# Patient Record
Sex: Female | Born: 1950 | Race: Black or African American | Hispanic: No | Smoking: Former smoker
Health system: Southern US, Community
[De-identification: ages and names within clinical notes are randomized; demographics above are authoritative.]

## PROBLEM LIST (undated history)

## (undated) DIAGNOSIS — K635 Polyp of colon: Secondary | ICD-10-CM

## (undated) DIAGNOSIS — Z8619 Personal history of other infectious and parasitic diseases: Secondary | ICD-10-CM

## (undated) DIAGNOSIS — I10 Essential (primary) hypertension: Secondary | ICD-10-CM

## (undated) DIAGNOSIS — G5710 Meralgia paresthetica, unspecified lower limb: Secondary | ICD-10-CM

## (undated) DIAGNOSIS — H409 Unspecified glaucoma: Secondary | ICD-10-CM

## (undated) HISTORY — PX: TONSILLECTOMY AND ADENOIDECTOMY: SHX28

## (undated) HISTORY — DX: Personal history of other infectious and parasitic diseases: Z86.19

## (undated) HISTORY — PX: BREAST CYST EXCISION: SHX579

## (undated) HISTORY — DX: Unspecified glaucoma: H40.9

## (undated) HISTORY — DX: Polyp of colon: K63.5

## (undated) HISTORY — DX: Meralgia paresthetica, unspecified lower limb: G57.10

## (undated) HISTORY — DX: Essential (primary) hypertension: I10

---

## 1999-12-10 ENCOUNTER — Encounter: Admission: RE | Admit: 1999-12-10 | Discharge: 2000-03-09 | Payer: Self-pay | Admitting: Internal Medicine

## 2000-09-23 ENCOUNTER — Other Ambulatory Visit: Admission: RE | Admit: 2000-09-23 | Discharge: 2000-09-23 | Payer: Self-pay | Admitting: Obstetrics and Gynecology

## 2000-09-23 ENCOUNTER — Encounter (INDEPENDENT_AMBULATORY_CARE_PROVIDER_SITE_OTHER): Payer: Self-pay

## 2000-12-07 ENCOUNTER — Encounter: Payer: Self-pay | Admitting: Emergency Medicine

## 2000-12-07 ENCOUNTER — Emergency Department (HOSPITAL_COMMUNITY): Admission: EM | Admit: 2000-12-07 | Discharge: 2000-12-08 | Payer: Self-pay | Admitting: Emergency Medicine

## 2002-03-07 ENCOUNTER — Emergency Department (HOSPITAL_COMMUNITY): Admission: EM | Admit: 2002-03-07 | Discharge: 2002-03-08 | Payer: Self-pay | Admitting: *Deleted

## 2002-03-07 ENCOUNTER — Encounter: Payer: Self-pay | Admitting: Emergency Medicine

## 2002-03-16 ENCOUNTER — Encounter: Payer: Self-pay | Admitting: Internal Medicine

## 2002-03-16 ENCOUNTER — Encounter: Admission: RE | Admit: 2002-03-16 | Discharge: 2002-03-16 | Payer: Self-pay | Admitting: Internal Medicine

## 2004-03-11 ENCOUNTER — Ambulatory Visit: Payer: Self-pay | Admitting: Family Medicine

## 2004-06-18 ENCOUNTER — Ambulatory Visit: Payer: Self-pay | Admitting: Family Medicine

## 2004-08-19 ENCOUNTER — Ambulatory Visit: Payer: Self-pay | Admitting: Family Medicine

## 2005-01-13 ENCOUNTER — Ambulatory Visit: Payer: Self-pay | Admitting: *Deleted

## 2005-02-25 ENCOUNTER — Ambulatory Visit: Payer: Self-pay | Admitting: Family Medicine

## 2005-05-27 ENCOUNTER — Ambulatory Visit: Payer: Self-pay | Admitting: Family Medicine

## 2005-09-16 ENCOUNTER — Ambulatory Visit: Payer: Self-pay | Admitting: Family Medicine

## 2005-09-25 ENCOUNTER — Ambulatory Visit: Payer: Self-pay | Admitting: Family Medicine

## 2008-05-14 ENCOUNTER — Emergency Department (HOSPITAL_COMMUNITY): Admission: EM | Admit: 2008-05-14 | Discharge: 2008-05-14 | Payer: Self-pay | Admitting: Emergency Medicine

## 2008-06-16 HISTORY — PX: CHOLECYSTECTOMY: SHX55

## 2010-07-07 ENCOUNTER — Encounter: Payer: Self-pay | Admitting: Emergency Medicine

## 2011-03-18 LAB — URINALYSIS, ROUTINE W REFLEX MICROSCOPIC
Bilirubin Urine: NEGATIVE
Glucose, UA: NEGATIVE mg/dL
Hgb urine dipstick: NEGATIVE
Ketones, ur: NEGATIVE mg/dL
Nitrite: NEGATIVE
Protein, ur: NEGATIVE mg/dL
Specific Gravity, Urine: 1.005 (ref 1.005–1.030)
Urobilinogen, UA: 0.2 mg/dL (ref 0.0–1.0)
pH: 5.5 (ref 5.0–8.0)

## 2011-03-18 LAB — URINE CULTURE: Colony Count: 40000

## 2011-03-18 LAB — URINE MICROSCOPIC-ADD ON

## 2011-03-26 ENCOUNTER — Other Ambulatory Visit: Payer: Self-pay | Admitting: Family Medicine

## 2011-03-26 DIAGNOSIS — Z1231 Encounter for screening mammogram for malignant neoplasm of breast: Secondary | ICD-10-CM

## 2011-04-28 ENCOUNTER — Ambulatory Visit: Payer: Self-pay

## 2011-04-30 ENCOUNTER — Ambulatory Visit
Admission: RE | Admit: 2011-04-30 | Discharge: 2011-04-30 | Disposition: A | Discharge status: Home / Self Care | Payer: No Typology Code available for payment source | Source: Ambulatory Visit | Attending: Family Medicine | Admitting: Family Medicine

## 2011-04-30 DIAGNOSIS — Z1231 Encounter for screening mammogram for malignant neoplasm of breast: Secondary | ICD-10-CM

## 2011-06-30 ENCOUNTER — Ambulatory Visit: Payer: Self-pay | Admitting: Family Medicine

## 2011-07-14 ENCOUNTER — Ambulatory Visit: Payer: Self-pay | Admitting: Family Medicine

## 2011-10-04 ENCOUNTER — Other Ambulatory Visit: Payer: Self-pay | Admitting: Family Medicine

## 2011-10-05 ENCOUNTER — Other Ambulatory Visit: Payer: Self-pay | Admitting: Family Medicine

## 2011-10-12 ENCOUNTER — Other Ambulatory Visit: Payer: Self-pay | Admitting: Family Medicine

## 2012-01-08 ENCOUNTER — Ambulatory Visit (INDEPENDENT_AMBULATORY_CARE_PROVIDER_SITE_OTHER): Payer: No Typology Code available for payment source | Admitting: Family Medicine

## 2012-01-08 ENCOUNTER — Encounter: Payer: Self-pay | Admitting: Family Medicine

## 2012-01-08 VITALS — BP 133/80 | HR 80 | Temp 97.4°F | Resp 18 | Ht 62.0 in | Wt 198.0 lb

## 2012-01-08 DIAGNOSIS — I10 Essential (primary) hypertension: Secondary | ICD-10-CM

## 2012-01-08 DIAGNOSIS — E669 Obesity, unspecified: Secondary | ICD-10-CM

## 2012-01-08 DIAGNOSIS — Z760 Encounter for issue of repeat prescription: Secondary | ICD-10-CM

## 2012-01-08 DIAGNOSIS — G5710 Meralgia paresthetica, unspecified lower limb: Secondary | ICD-10-CM | POA: Insufficient documentation

## 2012-01-08 DIAGNOSIS — IMO0001 Reserved for inherently not codable concepts without codable children: Secondary | ICD-10-CM

## 2012-01-08 LAB — POCT GLYCOSYLATED HEMOGLOBIN (HGB A1C): Hemoglobin A1C: 8.4

## 2012-01-08 LAB — BASIC METABOLIC PANEL
BUN: 11 mg/dL (ref 6–23)
CO2: 26 mEq/L (ref 19–32)
Calcium: 10.1 mg/dL (ref 8.4–10.5)
Chloride: 100 mEq/L (ref 96–112)
Creat: 0.65 mg/dL (ref 0.50–1.10)
Glucose, Bld: 128 mg/dL — ABNORMAL HIGH (ref 70–99)
Potassium: 3.9 mEq/L (ref 3.5–5.3)
Sodium: 136 mEq/L (ref 135–145)

## 2012-01-08 MED ORDER — METFORMIN HCL 1000 MG PO TABS: 1000.0000 mg | ORAL_TABLET | Freq: Two times a day (BID) | ORAL | Status: DC

## 2012-01-08 MED ORDER — LOSARTAN POTASSIUM-HCTZ 100-25 MG PO TABS: 1.0000 | ORAL_TABLET | Freq: Every day | ORAL | Status: DC

## 2012-01-08 MED ORDER — GLUCOSE BLOOD VI STRP: ORAL_STRIP | Status: DC

## 2012-01-08 MED ORDER — INSULIN GLARGINE 100 UNIT/ML ~~LOC~~ SOLN: 60.0000 [IU] | Freq: Every day | SUBCUTANEOUS | Status: DC

## 2012-01-08 NOTE — Patient Instructions (Addendum)
Diabetes Meal Planning Guide The diabetes meal planning guide is a tool to help you plan your meals and snacks. It is important for people with diabetes to manage their blood glucose (sugar) levels. Choosing the right foods and the right amounts throughout your day will help control your blood glucose. Eating right can even help you improve your blood pressure and reach or maintain a healthy weight. CARBOHYDRATE COUNTING MADE EASY When you eat carbohydrates, they turn to sugar. This raises your blood glucose level. Counting carbohydrates can help you control this level so you feel better. When you plan your meals by counting carbohydrates, you can have more flexibility in what you eat and balance your medicine with your food intake. Carbohydrate counting simply means adding up the total amount of carbohydrate grams in your meals and snacks. Try to eat about the same amount at each meal. Foods with carbohydrates are listed below. Each portion below is 1 carbohydrate serving or 15 grams of carbohydrates. Ask your dietician how many grams of carbohydrates you should eat at each meal or snack. Grains and Starches  1 slice bread.    English muffin or hotdog/hamburger bun.    cup cold cereal (unsweetened).   ? cup cooked pasta or rice.    cup starchy vegetables (corn, potatoes, peas, beans, winter squash).   1 tortilla (6 inches).    bagel.   1 waffle or pancake (size of a CD).    cup cooked cereal.   4 to 6 small crackers.  *Whole grain is recommended. Fruit  1 cup fresh unsweetened berries, melon, papaya, pineapple.   1 small fresh fruit.    banana or mango.    cup fruit juice (4 oz unsweetened).    cup canned fruit in natural juice or water.   2 tbs dried fruit.   12 to 15 grapes or cherries.  Milk and Yogurt  1 cup fat-free or 1% milk.   1 cup soy milk.   6 oz light yogurt with sugar-free sweetener.   6 oz low-fat soy yogurt.   6 oz plain yogurt.   Vegetables  1 cup raw or  cup cooked is counted as 0 carbohydrates or a "free" food.   If you eat 3 or more servings at 1 meal, count them as 1 carbohydrate serving.  Other Carbohydrates   oz chips or pretzels.    cup ice cream or frozen yogurt.    cup sherbet or sorbet.   2 inch square cake, no frosting.   1 tbs honey, sugar, jam, jelly, or syrup.   2 small cookies.   3 squares of graham crackers.   3 cups popcorn.   6 crackers.   1 cup broth-based soup.   Count 1 cup casserole or other mixed foods as 2 carbohydrate servings.   Foods with less than 20 calories in a serving may be counted as 0 carbohydrates or a "free" food.  You may want to purchase a book or computer software that lists the carbohydrate gram counts of different foods. In addition, the nutrition facts panel on the labels of the foods you eat are a good source of this information. The label will tell you how big the serving size is and the total number of carbohydrate grams you will be eating per serving. Divide this number by 15 to obtain the number of carbohydrate servings in a portion. Remember, 1 carbohydrate serving equals 15 grams of carbohydrate. SERVING SIZES Measuring foods and serving sizes helps you   make sure you are getting the right amount of food. The list below tells how big or small some common serving sizes are.  1 oz.........4 stacked dice.   3 oz........Marland KitchenDeck of cards.   1 tsp.......Marland KitchenTip of little finger.   1 tbs......Marland KitchenMarland KitchenThumb.   2 tbs.......Marland KitchenGolf ball.    cup......Marland KitchenHalf of a fist.   1 cup.......Marland KitchenA fist.  SAMPLE DIABETES MEAL PLAN Below is a sample meal plan that includes foods from the grain and starches, dairy, vegetable, fruit, and meat groups. A dietician can individualize a meal plan to fit your calorie needs and tell you the number of servings needed from each food group. However, controlling the total amount of carbohydrates in your meal or snack is more important than  making sure you include all of the food groups at every meal. You may interchange carbohydrate containing foods (dairy, starches, and fruits). The meal plan below is an example of a 2000 calorie diet using carbohydrate counting. This meal plan has 17 carbohydrate servings. Breakfast  1 cup oatmeal (2 carb servings).    cup light yogurt (1 carb serving).   1 cup blueberries (1 carb serving).    cup almonds.  Snack  1 large apple (2 carb servings).   1 low-fat string cheese stick.  Lunch  Chicken breast salad.   1 cup spinach.    cup chopped tomatoes.   2 oz chicken breast, sliced.   2 tbs low-fat [place] dressing.   12 whole-wheat crackers (2 carb servings).   12 to 15 grapes (1 carb serving).   1 cup low-fat milk (1 carb serving).  Snack  1 cup carrots.    cup hummus (1 carb serving).  Dinner  3 oz broiled salmon.   1 cup brown rice (3 carb servings).  Snack  1  cups steamed broccoli (1 carb serving) drizzled with 1 tsp olive oil and lemon juice.   1 cup light pudding (2 carb servings).  DIABETES MEAL PLANNING WORKSHEET Your dietician can use this worksheet to help you decide how many servings of foods and what types of foods are right for you.  BREAKFAST Food Group and Servings / Carb Servings Grain/Starches __________________________________ Dairy __________________________________________ Vegetable ______________________________________ Fruit ___________________________________________ Meat __________________________________________ Fat ____________________________________________ LUNCH Food Group and Servings / Carb Servings Grain/Starches ___________________________________ Dairy ___________________________________________ Fruit ____________________________________________ Meat ___________________________________________ Fat _____________________________________________ Laural Golden Food Group and Servings / Carb Servings Grain/Starches  ___________________________________ Dairy ___________________________________________ Fruit ____________________________________________ Meat ___________________________________________ Fat _____________________________________________ SNACKS Food Group and Servings / Carb Servings Grain/Starches ___________________________________ Dairy ___________________________________________ Vegetable _______________________________________ Fruit ____________________________________________ Meat ___________________________________________ Fat _____________________________________________ DAILY TOTALS Starches _________________________ Vegetable ________________________ Fruit ____________________________ Dairy ____________________________ Meat ____________________________ Fat ______________________________ Document Released: 02/27/2005 Document Revised: 05/22/2011 Document Reviewed: 01/08/2009 [employer] Patient Information 2012 [place], [place].   Exercise to Lose Weight Exercise and a healthy diet may help you lose weight. Your doctor may suggest specific exercises. EXERCISE IDEAS AND TIPS  Choose low-cost things you enjoy doing, such as walking, bicycling, or exercising to workout videos.   Take stairs instead of the elevator.   Walk during your lunch break.   Park your car further away from work or school.   Go to a gym or an exercise class.   Start with 5 to 10 minutes of exercise each day. Build up to 30 minutes of exercise 4 to 6 days a week.   Wear shoes with good support and comfortable clothes.   Stretch before and after working out.   Work out until you breathe harder and  your heart beats faster.   Drink extra water when you exercise.   Do not do so much that you hurt yourself, feel dizzy, or get very short of breath.  Exercises that burn about 150 calories:  Running 1  miles in 15 minutes.   Playing volleyball for 45 to 60 minutes.   Washing and waxing a car for  45 to 60 minutes.   Playing touch football for 45 minutes.   Walking 1  miles in 35 minutes.   Pushing a stroller 1  miles in 30 minutes.   Playing basketball for 30 minutes.   Raking leaves for 30 minutes.   Bicycling 5 miles in 30 minutes.   Walking 2 miles in 30 minutes.   Dancing for 30 minutes.   Shoveling snow for 15 minutes.   Swimming laps for 20 minutes.   Walking up stairs for 15 minutes.   Bicycling 4 miles in 15 minutes.   Gardening for 30 to 45 minutes.   Jumping rope for 15 minutes.   Washing windows or floors for 45 to 60 minutes.  Document Released: 07/05/2010 Document Revised: 05/22/2011 Document Reviewed: 07/05/2010 [facility] Patient Information 2012 [place], [place].

## 2012-01-09 ENCOUNTER — Telehealth: Payer: Self-pay

## 2012-01-09 NOTE — Telephone Encounter (Signed)
PT STATES SHE WAS GIVEN A CALL REGARDING HER LABS. PLEASE CALL AND LEAVE THE RESULTS ON HER MACHINE PLEASE CALL [phone]

## 2012-01-09 NOTE — Progress Notes (Signed)
Quick Note:  Please call pt and advise that the following labs are abnormal... Labs are good except blood sugar is elevated; she is a known Diabetic. Continue current treatment plan as we discussed.    Copy to pt. ______

## 2012-01-11 NOTE — Telephone Encounter (Signed)
Gave pt message of labs.

## 2012-01-12 ENCOUNTER — Encounter: Payer: Self-pay | Admitting: Family Medicine

## 2012-01-12 DIAGNOSIS — IMO0001 Reserved for inherently not codable concepts without codable children: Secondary | ICD-10-CM | POA: Insufficient documentation

## 2012-01-12 DIAGNOSIS — I10 Essential (primary) hypertension: Secondary | ICD-10-CM | POA: Insufficient documentation

## 2012-01-12 NOTE — Progress Notes (Signed)
S: This 61 y.o. AA female with Insulin-Dependent AODM and HTN, last seen by Dr. Hal Hope in Oct 2012 for CPE/PAP. A1C = 7.8%.  She states compliance with Lantus and Metformin. Admits lack of adherence to meal plan. Denies hypoglycemia.     FSBS= 90-140. No problems with paresthesias in hands or feet;and no ulcerations. Checks feet daily.          HTN: No weight loss. Compliant with medications without side effects. Pt denies fatigue, CP, chest discomfort,palpitations, HA, SOB, edema, dizziness, syncope or weakness.     Needs medication refills.     ROS: as per HPI     Fam Hx: Significant for both parents with Diabetes and one parent with HTN  O: Vital signs and nurses notes reviewed  Pt in NAD; WN,WD      HENT: [place]/AT; EOMI, conj/sclerae clear      LUNGS: Normal resp rate and effort      COR: RRR; no edema      NEURO: A&O x3; CNs intact and remainder nonfocal.      A1C= 8.4%      LABS (03/24/11): Cr= 0.70   LFTs= normal   TChol= 133   TGs=109   HDL= 47   LDL= 64  Vit D=31    A/P:     1. Type II or unspecified type diabetes mellitus without mention of complication, uncontrolled  POCT glycosylated hemoglobin (Hb A1C), Basic metabolic panel  2. HTN (hypertension) -stable RF: Losartan-HCTZ 100-25 mg  1 tablet daily  3. Medication refill  RF: Metformin and Lantus- no dose change (Consider changing Lantus to bid dosing)  4. Obesity, Class II, BMI 35-39.9, with comorbidity  Pt is strongly encouraged to manage nutrition better and increase physical activity for weight loss

## 2012-01-19 ENCOUNTER — Other Ambulatory Visit: Payer: Self-pay | Admitting: Physician Assistant

## 2012-02-03 ENCOUNTER — Encounter: Payer: Self-pay | Admitting: Physician Assistant

## 2012-02-03 DIAGNOSIS — E11319 Type 2 diabetes mellitus with unspecified diabetic retinopathy without macular edema: Secondary | ICD-10-CM | POA: Insufficient documentation

## 2012-06-15 ENCOUNTER — Other Ambulatory Visit: Payer: Self-pay | Admitting: Family Medicine

## 2012-06-15 DIAGNOSIS — Z1231 Encounter for screening mammogram for malignant neoplasm of breast: Secondary | ICD-10-CM

## 2012-06-16 DIAGNOSIS — Z0271 Encounter for disability determination: Secondary | ICD-10-CM

## 2012-06-28 ENCOUNTER — Ambulatory Visit
Admission: RE | Admit: 2012-06-28 | Discharge: 2012-06-28 | Disposition: A | Discharge status: Home / Self Care | Payer: No Typology Code available for payment source | Source: Ambulatory Visit | Attending: Family Medicine | Admitting: Family Medicine

## 2012-06-28 DIAGNOSIS — Z1231 Encounter for screening mammogram for malignant neoplasm of breast: Secondary | ICD-10-CM

## 2012-07-15 ENCOUNTER — Encounter: Payer: No Typology Code available for payment source | Admitting: Family Medicine

## 2012-07-26 ENCOUNTER — Other Ambulatory Visit: Payer: Self-pay | Admitting: Family Medicine

## 2012-08-13 ENCOUNTER — Telehealth: Payer: Self-pay

## 2012-08-13 MED ORDER — INSULIN NPH (HUMAN) (ISOPHANE) 100 UNIT/ML ~~LOC~~ SUSP: 20.0000 [IU] | Freq: Every day | SUBCUTANEOUS | Status: DC

## 2012-08-13 NOTE — Telephone Encounter (Signed)
Spoke with pt about inability to pay for Lantus [employer] does not cover); will change pt to NPH start at 20 units hs and titrate upward based on FSBS. Pt has OV in ~ 3 weeks; pt has CPE scheduled but may only have time to do DM follow-up.

## 2012-08-13 NOTE — Telephone Encounter (Signed)
There is not a generic Lantus and it looks like pt is in need of an OV with Dr Audria Nine so I recommend that the patient get a 1 month supply and see Dr Audria Nine to discuss a change in her insulin.

## 2012-08-13 NOTE — Telephone Encounter (Signed)
Patient tried to pick up insulin at [facility] on [address] and it was very expensive. She is wondering if there is a generic form that can be sent to [facility] on [address]. She says [facility]'t carry a generic version, but they told [facility].  Best [phone]

## 2012-08-13 NOTE — Telephone Encounter (Signed)
She is on 60 unit dose, so I think 5 pens would be a 1 month supply for her, she does have an OV scheduled to see Dr Audria Nine next month sent this in for her. I have told her the 5 pens would be a 1 mo supply she states she can not afford this, she would need a generic insulin. She can not get the pens, can not afford them. Please recommend an alternative.

## 2012-08-17 ENCOUNTER — Other Ambulatory Visit: Payer: Self-pay | Admitting: Family Medicine

## 2012-08-17 MED ORDER — INSULIN NPH (HUMAN) (ISOPHANE) 100 UNIT/ML ~~LOC~~ SUSP: 20.0000 [IU] | Freq: Every day | SUBCUTANEOUS | Status: DC

## 2012-08-17 NOTE — Telephone Encounter (Signed)
I called [employer] to try to complete a prior authorization for pt's Humulin N (I had received notice from pharmacy that this was needed). [employer] advised that pt has to have documented failure w/ trial of Novolin N previously before they will cover the Humulin.  Dr Audria Nine, do you want to change Rx to Novolin N 100 units/mL?

## 2012-09-03 ENCOUNTER — Telehealth: Payer: Self-pay

## 2012-09-03 NOTE — Telephone Encounter (Signed)
insulin NPH (NOVOLIN N) 100 UNIT/ML injection  [facility] [address]  (Does not want to use [facility])  Wants time release insulin - needs something affordable pen only  CBN:  [phone]

## 2012-09-08 ENCOUNTER — Ambulatory Visit (INDEPENDENT_AMBULATORY_CARE_PROVIDER_SITE_OTHER): Payer: No Typology Code available for payment source | Admitting: Family Medicine

## 2012-09-08 ENCOUNTER — Encounter: Payer: Self-pay | Admitting: Family Medicine

## 2012-09-08 VITALS — BP 142/94 | HR 82 | Temp 97.9°F | Resp 16 | Ht 61.5 in | Wt 196.2 lb

## 2012-09-08 DIAGNOSIS — Z23 Encounter for immunization: Secondary | ICD-10-CM

## 2012-09-08 DIAGNOSIS — Z0000 Encounter for general adult medical examination without abnormal findings: Secondary | ICD-10-CM

## 2012-09-08 DIAGNOSIS — IMO0001 Reserved for inherently not codable concepts without codable children: Secondary | ICD-10-CM

## 2012-09-08 LAB — COMPREHENSIVE METABOLIC PANEL
ALT: 45 U/L — ABNORMAL HIGH (ref 0–35)
AST: 43 U/L — ABNORMAL HIGH (ref 0–37)
Albumin: 4.7 g/dL (ref 3.5–5.2)
Alkaline Phosphatase: 55 U/L (ref 39–117)
BUN: 13 mg/dL (ref 6–23)
CO2: 25 mEq/L (ref 19–32)
Calcium: 9.7 mg/dL (ref 8.4–10.5)
Chloride: 101 mEq/L (ref 96–112)
Creat: 0.77 mg/dL (ref 0.50–1.10)
Glucose, Bld: 213 mg/dL — ABNORMAL HIGH (ref 70–99)
Potassium: 4.1 mEq/L (ref 3.5–5.3)
Sodium: 138 mEq/L (ref 135–145)
Total Bilirubin: 0.4 mg/dL (ref 0.3–1.2)
Total Protein: 7.2 g/dL (ref 6.0–8.3)

## 2012-09-08 LAB — POCT URINALYSIS DIPSTICK
Bilirubin, UA: NEGATIVE
Blood, UA: NEGATIVE
Glucose, UA: NEGATIVE
Leukocytes, UA: NEGATIVE
Nitrite, UA: NEGATIVE
Spec Grav, UA: >=1.03
Urobilinogen, UA: 0.2
pH, UA: 5.5

## 2012-09-08 LAB — LIPID PANEL
Cholesterol: 165 mg/dL (ref 0–200)
HDL: 43 mg/dL (ref >39)
LDL Cholesterol: 100 mg/dL — ABNORMAL HIGH (ref 0–99)
Total CHOL/HDL Ratio: 3.8 Ratio
Triglycerides: 111 mg/dL (ref <150)
VLDL: 22 mg/dL (ref 0–40)

## 2012-09-08 LAB — IFOBT (OCCULT BLOOD): IFOBT: NEGATIVE

## 2012-09-08 LAB — POCT GLYCOSYLATED HEMOGLOBIN (HGB A1C): Hemoglobin A1C: 9.4

## 2012-09-08 MED ORDER — LOSARTAN POTASSIUM-HCTZ 100-25 MG PO TABS: 1.0000 | ORAL_TABLET | Freq: Every day | ORAL | Status: DC

## 2012-09-08 MED ORDER — METFORMIN HCL 1000 MG PO TABS: 1000.0000 mg | ORAL_TABLET | Freq: Two times a day (BID) | ORAL | Status: DC

## 2012-09-08 MED ORDER — INSULIN GLARGINE 100 UNIT/ML ~~LOC~~ SOLN: 60.0000 [IU] | Freq: Every day | SUBCUTANEOUS | Status: DC

## 2012-09-08 NOTE — Patient Instructions (Signed)
Keeping You Healthy  Get These Tests  Blood Pressure- Have your blood pressure checked by your healthcare provider at least once a year.  Normal blood pressure is 120/80.  Weight- Have your body mass index (BMI) calculated to screen for obesity.  BMI is a measure of body fat based on height and weight.  You can calculate your own BMI at https://www.west-esparza.com/  Cholesterol- Have your cholesterol checked every year.  Diabetes- Have your blood sugar checked every year if you have high blood pressure, high cholesterol, a family history of diabetes or if you are overweight.  Pap Smear- Have a pap smear every 1 to 3 years if you have been sexually active.  If you are older than 65 and recent pap smears have been normal you may not need additional pap smears.  In addition, if you have had a hysterectomy  For benign disease additional pap smears are not necessary.  Mammogram-Yearly mammograms are essential for early detection of breast cancer  Screening for Colon Cancer- Colonoscopy starting at age 72. Screening may begin sooner depending on your family history and other health conditions.  Follow up colonoscopy as directed by your Gastroenterologist.  Screening for Osteoporosis- Screening begins at age 27 with bone density scanning, sooner if you are at higher risk for developing Osteoporosis.  Get these medicines  Calcium with Vitamin D- Your body requires 1200-1500 mg of Calcium a day and [phone] IU of Vitamin D a day.  You can only absorb 500 mg of Calcium at a time therefore Calcium must be taken in 2 or 3 separate doses throughout the day.  Hormones- Hormone therapy has been associated with increased risk for certain cancers and heart disease.  Talk to your healthcare provider about if you need relief from menopausal symptoms.  Aspirin- Ask your healthcare provider about taking Aspirin to prevent Heart Disease and Stroke.  Get these Immuniztions  Flu shot- Every fall  Pneumonia  shot- Once after the age of 33; if you are younger ask your healthcare provider if you need a pneumonia shot.  Tetanus- Every ten years. You received a Tdap today; next vaccine is due in 2024.  Zostavax- Once after the age of 14 to prevent shingles. You have a prescription to have this vaccine administered at [facility] or [employer].  Take these steps  Don't smoke- Your healthcare provider can help you quit. For tips on how to quit, ask your healthcare provider or go to www.smokefree.gov or call 1-800 QUIT-NOW.  Be physically active- Exercise 5 days a week for a minimum of 30 minutes.  If you are not already physically active, start slow and gradually work up to 30 minutes of moderate physical activity.  Try walking, dancing, bike riding, swimming, etc.  Eat a healthy diet- Eat a variety of healthy foods such as fruits, vegetables, whole grains, low fat milk, low fat cheeses, yogurt, lean meats, chicken, fish, eggs, dried beans, tofu, etc.  For more information go to www.thenutritionsource.org  Dental visit- Brush and floss teeth twice daily; visit your dentist twice a year.  Eye exam- Visit your Optometrist or Ophthalmologist yearly.  Drink alcohol in moderation- Limit alcohol intake to one drink or less a day.  Never drink and drive.  Depression- Your emotional health is as important as your physical health.  If you're feeling down or losing interest in things you normally enjoy, please talk to your healthcare provider.  Seat Belts- can save your life; always wear one  Smoke/Carbon Monoxide  detectors- These detectors need to be installed on the appropriate level of your home.  Replace batteries at least once a year.  Violence- If anyone is threatening or hurting you, please tell your healthcare provider.  Living Will/ Health care power of attorney- Discuss with your healthcare provider and family.

## 2012-09-08 NOTE — Progress Notes (Signed)
Subjective:    Patient ID: Kristen [name], female    DOB: [birth year], 62 y.o.   MRN: [id number]  HPI This 62 y.o. AA female is here for CPE (no PAP). She needs Insulin refill and prefers Lantus.  She has been using some of her sister's Lantus Insulin; she did not pick up prescription for NPH  Insulin. FSBS have been "running high".  Pt had recent eye exam and surgery where a lesion was  removed from corner of L eye. She reports some changes c/w uncontrolled DM were noted on  exam.   HCM: UTD. Needs Tdap and Zostavax.    Review of Systems  Constitutional: Positive for diaphoresis.  HENT: Negative.   Eyes: Negative.   Respiratory: Negative.   Cardiovascular: Negative.   Gastrointestinal: Negative.   Endocrine: Negative.   Genitourinary: Negative.   Musculoskeletal: Negative.   Skin: Negative.   Allergic/Immunologic: Negative.   Neurological: Positive for dizziness.  Hematological: Negative.   Psychiatric/Behavioral: Negative.        Objective:   Physical Exam  Nursing note and vitals reviewed. Constitutional: She is oriented to person, place, and time. Vital signs are normal. She appears well-developed and well-nourished. No distress.  HENT:  Head: Normocephalic and atraumatic.  Right Ear: Hearing, external ear and ear canal normal. Tympanic membrane is scarred. Tympanic membrane is not erythematous.  Left Ear: Hearing, external ear and ear canal normal. Tympanic membrane is scarred. Tympanic membrane is not erythematous.  Nose: Nose normal. No nasal deformity or septal deviation.  Mouth/Throat: Uvula is midline, oropharynx is clear and moist and mucous membranes are normal. No oral lesions. No dental caries.  Eyes: Conjunctivae and EOM are normal. Pupils are equal, round, and reactive to light. No scleral icterus.  Neck: Normal range of motion. Neck supple. No JVD present. No thyromegaly present.  Cardiovascular: Normal rate, regular rhythm, normal heart sounds and intact  distal pulses.  Exam reveals no gallop and no friction rub.   No murmur heard. Pulmonary/Chest: Breath sounds normal. No respiratory distress.  Abdominal: Soft. Normal appearance. She exhibits no distension, no abdominal bruit, no pulsatile midline mass and no mass. Bowel sounds are decreased. There is no hepatosplenomegaly. There is no tenderness. There is no guarding and no CVA tenderness.  Genitourinary: Rectum normal. Rectal exam shows no external hemorrhoid, no fissure, no mass, no tenderness and anal tone normal. Guaiac negative stool. There is no rash or tenderness on the right labia. There is no rash or tenderness on the left labia.  NEFG; PAP/pelvic exam deferred.  Musculoskeletal: Normal range of motion. She exhibits no edema and no tenderness.  Lymphadenopathy:    She has no cervical adenopathy.       Right: No inguinal adenopathy present.       Left: No inguinal adenopathy present.  Neurological: She is alert and oriented to person, place, and time. She has normal reflexes. No cranial nerve deficit. She exhibits normal muscle tone. Coordination normal.  Skin: Skin is warm and dry. No rash noted. No erythema.  Psychiatric: She has a normal mood and affect. Her behavior is normal. Judgment and thought content normal.     Results for orders placed in visit on 09/08/12  POCT URINALYSIS DIPSTICK      Result Value Range   Color, UA yellow     Clarity, UA clear     Glucose, UA neg     Bilirubin, UA neg     Ketones, UA trace     Spec  Grav, UA >=1.030     Blood, UA neg     pH, UA 5.5     Protein, UA 30mg /dl     Urobilinogen, UA 0.2     Nitrite, UA neg     Leukocytes, UA Negative    POCT GLYCOSYLATED HEMOGLOBIN (HGB A1C)      Result Value Range   Hemoglobin A1C 9.4    IFOBT (OCCULT BLOOD)      Result Value Range   IFOBT Negative      ECG: NSR; no ST-TW changes. No ectopy.      Assessment & Plan:  Routine general medical examination at a health care facility -  RX for  Zostavax given to pt.   Plan: POCT urinalysis dipstick, EKG 12-Lead, IFOBT POC (occult bld, rslt in office)  Type II or unspecified type diabetes mellitus without mention of complication, uncontrolled -  RX: Lantus Insulin  60 units subcutaneously hs (pt states she has met deductible and preferred Insulin should now be covered). Continue Metformin at current dose. Plan: Comprehensive metabolic panel, Lipid panel, POCT glycosylated hemoglobin (Hb A1C)  Need for prophylactic vaccination with combined diphtheria-tetanus-pertussis (DTP) vaccine

## 2012-09-15 NOTE — Progress Notes (Signed)
Quick Note:  Please contact pt and advise that the following labs are abnormal... Blood sugar Was elevated as expected (A1c=9.4% at office visit). Liver function tests a little above normal (could be related to medication use and uncontrolled Diabetes).  Cholesterol panel looks good except LDL ("bad") cholesterol needs to be lower. Improving nutrition and increased activity could help lower this number. Continue Fish Oil supplement = 2000 mg daily.  Copy to pt. ______

## 2012-11-24 ENCOUNTER — Ambulatory Visit (INDEPENDENT_AMBULATORY_CARE_PROVIDER_SITE_OTHER): Payer: No Typology Code available for payment source | Admitting: Family Medicine

## 2012-11-24 ENCOUNTER — Encounter: Payer: Self-pay | Admitting: Family Medicine

## 2012-11-24 VITALS — BP 138/92 | HR 78 | Temp 97.7°F | Resp 16 | Ht 62.0 in | Wt 195.2 lb

## 2012-11-24 DIAGNOSIS — R945 Abnormal results of liver function studies: Secondary | ICD-10-CM

## 2012-11-24 DIAGNOSIS — IMO0001 Reserved for inherently not codable concepts without codable children: Secondary | ICD-10-CM

## 2012-11-24 DIAGNOSIS — R112 Nausea with vomiting, unspecified: Secondary | ICD-10-CM

## 2012-11-24 LAB — POCT GLYCOSYLATED HEMOGLOBIN (HGB A1C): Hemoglobin A1C: 9.6

## 2012-11-24 MED ORDER — METFORMIN HCL 1000 MG PO TABS: ORAL_TABLET | ORAL | Status: DC

## 2012-11-24 NOTE — Patient Instructions (Signed)
I have increased your Insulin dose to 25 units twice a day. Take Metformin 1 tablet every morning only with breakfast. You will return for Diabetes follow-up in 3 months.

## 2012-11-24 NOTE — Progress Notes (Signed)
S:  This 62 y.o. AA female presents today for eval of nausea/ vomiting and diarrhea, onset during recent drive to [place]. Another family member was ill during that trip. Pt has been afebrile and has not had any symptoms in last 2 days. Pt attributes symptoms to Metformin, only the PM dose. She tolerates AM dose w/o difficulty but reports n/v in the evening after bedtime dose. FSBS= 130-140 in AM and 255-300 HS. Pt has not taken Metformin since yesterday morning. She is not using Lantus Insulin but takes Humalog 40 units every evening.  Patient Active Problem List   Diagnosis Date Noted  . Diabetic retinopathy 02/03/2012  . Type II or unspecified type diabetes mellitus without mention of complication, uncontrolled 01/12/2012  . HTN (hypertension) 01/12/2012  . Obesity, Class II, BMI 35-39.9, with comorbidity 01/12/2012  . Meralgia paresthetica    HCM: pt declines Pneunovax.  PMHx, Soc Hx and Fam Hx reviewed.  O: Filed Vitals:   11/24/12 0933  BP: 138/92  Pulse: 78  Temp: 97.7 F (36.5 C)  Resp: 16   GEN: in NAD: WN,WD. HENT: [place]/AT; EOMI w/ clear conj/sclerae. EACs/nose/oroph unremarkable. COR: RRR. No m/g/r. LUNGS: CTA; normal resp rate and effort. BACK: No CVAT. ABD: Normal BS; soft, slightly distended but no guarding. No HSM or masses. SKIN: W&D. No rashes or erythema. NEURO: A&O x 3; CNs intact. Nonfocal.   Results for orders placed in visit on 11/24/12  POCT GLYCOSYLATED HEMOGLOBIN (HGB A1C)      Result Value Range   Hemoglobin A1C 9.6      A/P: Nausea with vomiting- resolved; discontinue evening dose of Metformin.  Type II or unspecified type diabetes mellitus without mention of complication, uncontrolled - Plan: Increase Humalog to 25 units subq bid; pt reports that family supplies her Insulin. Continue morning dose of Metformin.  Abnormal LFTs - Plan: monitor; pt declines to have Hep C antibody repeated (she says this was checked ~ 2 years ago). She does not want to  incur additional cost today.

## 2012-12-09 ENCOUNTER — Ambulatory Visit: Payer: No Typology Code available for payment source | Admitting: Family Medicine

## 2013-01-28 ENCOUNTER — Other Ambulatory Visit: Payer: Self-pay | Admitting: Family Medicine

## 2013-01-31 ENCOUNTER — Other Ambulatory Visit: Payer: Self-pay

## 2013-01-31 MED ORDER — GLUCOSE BLOOD VI STRP: ORAL_STRIP | Status: DC

## 2013-02-23 ENCOUNTER — Ambulatory Visit: Payer: No Typology Code available for payment source | Admitting: Family Medicine

## 2013-03-16 ENCOUNTER — Ambulatory Visit: Payer: No Typology Code available for payment source | Admitting: Family Medicine

## 2013-07-21 ENCOUNTER — Ambulatory Visit (INDEPENDENT_AMBULATORY_CARE_PROVIDER_SITE_OTHER): Payer: 59 | Admitting: Emergency Medicine

## 2013-07-21 VITALS — BP 138/82 | HR 78 | Temp 98.4°F | Resp 18 | Ht 61.0 in | Wt 195.0 lb

## 2013-07-21 DIAGNOSIS — K2970 Gastritis, unspecified, without bleeding: Secondary | ICD-10-CM

## 2013-07-21 DIAGNOSIS — K2990 Gastroduodenitis, unspecified, without bleeding: Principal | ICD-10-CM

## 2013-07-21 LAB — POCT CBC
Granulocyte percent: 54.4 %G (ref 37–80)
HCT, POC: 46.8 % (ref 37.7–47.9)
Hemoglobin: 14.4 g/dL (ref 12.2–16.2)
Lymph, poc: 2.7 (ref 0.6–3.4)
MCH, POC: 29.4 pg (ref 27–31.2)
MCHC: 30.8 g/dL — AB (ref 31.8–35.4)
MCV: 95.6 fL (ref 80–97)
MID (cbc): 0.4 (ref 0–0.9)
MPV: 9.2 fL (ref 0–99.8)
POC Granulocyte: 3.7 (ref 2–6.9)
POC LYMPH PERCENT: 39.3 %L (ref 10–50)
POC MID %: 6.3 %M (ref 0–12)
Platelet Count, POC: 176 10*3/uL (ref 142–424)
RBC: 4.9 M/uL (ref 4.04–5.48)
RDW, POC: 14.4 %
WBC: 6.8 10*3/uL (ref 4.6–10.2)

## 2013-07-21 MED ORDER — LANSOPRAZOLE 30 MG PO CPDR: 30.0000 mg | DELAYED_RELEASE_CAPSULE | Freq: Every day | ORAL | Status: DC

## 2013-07-21 MED ORDER — SUCRALFATE 1 G PO TABS: ORAL_TABLET | ORAL | Status: DC

## 2013-07-21 NOTE — Patient Instructions (Signed)
gGastritis, Adult Gastritis is soreness and swelling (inflammation) of the lining of the stomach. Gastritis can develop as a sudden onset (acute) or long-term (chronic) condition. If gastritis is not treated, it can lead to stomach bleeding and ulcers. CAUSES  Gastritis occurs when the stomach lining is weak or damaged. Digestive juices from the stomach then inflame the weakened stomach lining. The stomach lining may be weak or damaged due to viral or bacterial infections. One common bacterial infection is the Helicobacter pylori infection. Gastritis can also result from excessive alcohol consumption, taking certain medicines, or having too much acid in the stomach.  SYMPTOMS  In some cases, there are no symptoms. When symptoms are present, they may include:  Pain or a burning sensation in the upper abdomen.  Nausea.  Vomiting.  An uncomfortable feeling of fullness after eating. DIAGNOSIS  Your caregiver may suspect you have gastritis based on your symptoms and a physical exam. To determine the cause of your gastritis, your caregiver may perform the following:  Blood or stool tests to check for the H pylori bacterium.  Gastroscopy. A thin, flexible tube (endoscope) is passed down the esophagus and into the stomach. The endoscope has a light and camera on the end. Your caregiver uses the endoscope to view the inside of the stomach.  Taking a tissue sample (biopsy) from the stomach to examine under a microscope. TREATMENT  Depending on the cause of your gastritis, medicines may be prescribed. If you have a bacterial infection, such as an H pylori infection, antibiotics may be given. If your gastritis is caused by too much acid in the stomach, H2 blockers or antacids may be given. Your caregiver may recommend that you stop taking aspirin, ibuprofen, or other nonsteroidal anti-inflammatory drugs (NSAIDs). HOME CARE INSTRUCTIONS  Only take over-the-counter or prescription medicines as directed  by your caregiver.  If you were given antibiotic medicines, take them as directed. Finish them even if you start to feel better.  Drink enough fluids to keep your urine clear or pale yellow.  Avoid foods and drinks that make your symptoms worse, such as:  Caffeine or alcoholic drinks.  Chocolate.  Peppermint or mint flavorings.  Garlic and onions.  Spicy foods.  Citrus fruits, such as oranges, lemons, or limes.  Tomato-based foods such as sauce, chili, salsa, and pizza.  Fried and fatty foods.  Eat small, frequent meals instead of large meals. SEEK IMMEDIATE MEDICAL CARE IF:   You have black or dark red stools.  You vomit blood or material that looks like coffee grounds.  You are unable to keep fluids down.  Your abdominal pain gets worse.  You have a fever.  You do not feel better after 1 week.  You have any other questions or concerns. MAKE SURE YOU:  Understand these instructions.  Will watch your condition.  Will get help right away if you are not doing well or get worse. Document Released: 05/27/2001 Document Revised: 12/02/2011 Document Reviewed: 07/16/2011 [facility] Patient Information 2014 [place]Care, [place].

## 2013-07-21 NOTE — Progress Notes (Signed)
[facility] [address] [place] [address] [phone]  Date:  07/21/2013   Name:  Kristen [name]   DOB:  [birth year]   MRN:  [id number]  PCP:  No primary provider on file.    Chief Complaint: abdominal discomfort, Nausea and Diarrhea   History of Present Illness:  Kristen [name] is a 63 y.o. very pleasant female patient who presents with the following:  History of cholecystectomy.  Now has upper abdominal pain over the past 6 months.  Worse with eating, particularly fried and greasy foods.  No alcohol.  Nonsmoker.  Little caffeine.  Worse with NSAIDS.  Improved transiently with a bottle of mylanta over the course of a weekend.  No improvement with over the counter medications or other home remedies. Denies other complaint or health concern today.   Patient Active Problem List   Diagnosis Date Noted  . Diabetic retinopathy 02/03/2012  . Type II or unspecified type diabetes mellitus without mention of complication, uncontrolled 01/12/2012  . HTN (hypertension) 01/12/2012  . Obesity, Class II, BMI 35-39.9, with comorbidity 01/12/2012  . Meralgia paresthetica     Past Medical History  Diagnosis Date  . HTN (hypertension)   . Meralgia paresthetica   . Glaucoma   . Diabetes mellitus     Past Surgical History  Procedure Laterality Date  . Breast cyst excision    . Tonsillectomy and adenoidectomy    . Cholecystectomy  06/16/2008    History  Substance Use Topics  . Smoking status: Never Smoker   . Smokeless tobacco: Not on file  . Alcohol Use: No    Family History  Problem Relation Age of Onset  . Hypertension Mother   . Diabetes Mother   . Thyroid disease Mother   . Diabetes Father   . Cancer Father     Colon cancer  . Diabetes Sister     Allergies  Allergen Reactions  . Latex Hives    itching    Medication list has been reviewed and updated.  Current Outpatient Prescriptions on File Prior to Visit  Medication Sig Dispense Refill  .  Ascorbic Acid (VITAMIN C) 1000 MG tablet Take 1,000 mg by mouth daily.      . calcium citrate-vitamin D 200-200 MG-UNIT TABS Take 1 tablet by mouth daily.      Marland Kitchen glucose blood (ACCU-CHEK AVIVA PLUS) test strip Use to test blood sugar three times daily.  300 each  2  . insulin lispro (HUMALOG KWIKPEN) 100 unit/mL SOLN Inject 40 Units into the skin once.      Marland Kitchen losartan-hydrochlorothiazide (HYZAAR) 100-25 MG per tablet Take 1 tablet by mouth daily. NEED REFILLS      . metFORMIN (GLUCOPHAGE) 1000 MG tablet Take 1 tablet daily with morning meal.  90 tablet  1  . Multiple Vitamin (MULTIVITAMIN) tablet Take 1 tablet by mouth daily.      . Omega-3 Fatty Acids (FISH OIL) 1000 MG CAPS Take by mouth daily.      Marland Kitchen OVER THE COUNTER MEDICATION Energy greens powder      . glucose blood (ACCU-CHEK AVIVA PLUS) test strip Use as instructed  100 each  5   No current facility-administered medications on file prior to visit.    Review of Systems:  As per HPI, otherwise negative.    Physical Examination: Filed Vitals:   07/21/13 1355  BP: 138/82  Pulse: 78  Temp: 98.4 F (36.9 C)  Resp: 18   Filed Vitals:  07/21/13 1355  Height: 5\' 1"  (1.549 m)  Weight: 195 lb (88.451 kg)   Body mass index is 36.86 kg/(m^2). Ideal Body Weight: Weight in (lb) to have BMI = 25: 132  GEN: WDWN, NAD, Non-toxic, A & O x 3 HEENT: Atraumatic, Normocephalic. Neck supple. No masses, No LAD. Ears and Nose: No external deformity. CV: RRR, No M/G/R. No JVD. No thrill. No extra heart sounds. PULM: CTA B, no wheezes, crackles, rhonchi. No retractions. No resp. distress. No accessory muscle use. ABD: S, tender epigastrium, ND, +BS. No rebound. No HSM. EXTR: No c/c/e NEURO Normal gait.  PSYCH: Normally interactive. Conversant. Not depressed or anxious appearing.  Calm demeanor.    Assessment and Plan: Gastritis carafate Prevacid labs Signed,  Phillips OdorJeffery Leslye Puccini, MD   Results for orders placed in visit on 07/21/13   POCT CBC      Result Value Range   WBC 6.8  4.6 - 10.2 K/uL   Lymph, poc 2.7  0.6 - 3.4   POC LYMPH PERCENT 39.3  10 - 50 %L   MID (cbc) 0.4  0 - 0.9   POC MID % 6.3  0 - 12 %M   POC Granulocyte 3.7  2 - 6.9   Granulocyte percent 54.4  37 - 80 %G   RBC 4.90  4.04 - 5.48 M/uL   Hemoglobin 14.4  12.2 - 16.2 g/dL   HCT, POC 16.146.8  09.637.7 - 47.9 %   MCV 95.6  80 - 97 fL   MCH, POC 29.4  27 - 31.2 pg   MCHC 30.8 (*) 31.8 - 35.4 g/dL   RDW, POC 04.514.4     Platelet Count, POC 176  142 - 424 K/uL   MPV 9.2  0 - 99.8 fL

## 2013-07-22 LAB — H. PYLORI ANTIBODY, IGG: H Pylori IgG: >8 {ISR} — ABNORMAL HIGH

## 2013-07-23 ENCOUNTER — Other Ambulatory Visit: Payer: Self-pay | Admitting: Emergency Medicine

## 2013-07-23 MED ORDER — AMOXICILL-CLARITHRO-LANSOPRAZ PO MISC: ORAL | Status: DC

## 2013-07-24 ENCOUNTER — Telehealth: Payer: Self-pay

## 2013-07-24 MED ORDER — CLARITHROMYCIN ER 500 MG PO TB24: 500.0000 mg | ORAL_TABLET | Freq: Two times a day (BID) | ORAL | Status: DC

## 2013-07-24 MED ORDER — OMEPRAZOLE 20 MG PO CPDR: 20.0000 mg | DELAYED_RELEASE_CAPSULE | Freq: Two times a day (BID) | ORAL | Status: DC

## 2013-07-24 MED ORDER — AMOXICILLIN 500 MG PO TABS: 1000.0000 mg | ORAL_TABLET | Freq: Two times a day (BID) | ORAL | Status: DC

## 2013-07-24 NOTE — Telephone Encounter (Signed)
Patient called to speak with Angie regarding a message she received. She states she received a message stating her antibiotic was called into her pharmacy. She says the medication is not there. Please return her call at [phone]. Thank you!

## 2013-07-24 NOTE — Telephone Encounter (Signed)
Sent omeprazole, amox, clarithro to pharmacy.  Pt notified

## 2013-07-24 NOTE — Telephone Encounter (Signed)
Spoke with patient and pharmacy. Pharmacy did not receive. Gave verbal over phone for prevpak or generic components. Pharmacy states not covered by insurance. Is there something else we can send in. Please advise

## 2013-07-26 ENCOUNTER — Ambulatory Visit: Payer: No Typology Code available for payment source | Admitting: Family Medicine

## 2013-08-02 ENCOUNTER — Ambulatory Visit: Payer: 59 | Admitting: Family Medicine

## 2013-08-09 ENCOUNTER — Ambulatory Visit: Payer: 59 | Admitting: Family Medicine

## 2013-08-26 ENCOUNTER — Encounter: Payer: Self-pay | Admitting: Family Medicine

## 2013-08-26 ENCOUNTER — Ambulatory Visit (INDEPENDENT_AMBULATORY_CARE_PROVIDER_SITE_OTHER): Payer: 59 | Admitting: Family Medicine

## 2013-08-26 VITALS — BP 139/79 | HR 78 | Temp 98.4°F | Resp 16 | Ht 61.5 in | Wt 195.0 lb

## 2013-08-26 DIAGNOSIS — E083299 Diabetes mellitus due to underlying condition with mild nonproliferative diabetic retinopathy without macular edema, unspecified eye: Secondary | ICD-10-CM

## 2013-08-26 DIAGNOSIS — I10 Essential (primary) hypertension: Secondary | ICD-10-CM

## 2013-08-26 DIAGNOSIS — IMO0001 Reserved for inherently not codable concepts without codable children: Secondary | ICD-10-CM

## 2013-08-26 DIAGNOSIS — E1339 Other specified diabetes mellitus with other diabetic ophthalmic complication: Secondary | ICD-10-CM

## 2013-08-26 DIAGNOSIS — E11329 Type 2 diabetes mellitus with mild nonproliferative diabetic retinopathy without macular edema: Secondary | ICD-10-CM

## 2013-08-26 LAB — POCT GLYCOSYLATED HEMOGLOBIN (HGB A1C): Hemoglobin A1C: 9.4

## 2013-08-26 LAB — GLUCOSE, POCT (MANUAL RESULT ENTRY): POC Glucose: 308 mg/dl — AB (ref 70–99)

## 2013-08-26 MED ORDER — LOSARTAN POTASSIUM-HCTZ 100-25 MG PO TABS: 1.0000 | ORAL_TABLET | Freq: Every day | ORAL | Status: DC

## 2013-08-26 MED ORDER — FREESTYLE SYSTEM KIT: 1.0000 | PACK | Status: DC | PRN

## 2013-08-26 MED ORDER — METFORMIN HCL 1000 MG PO TABS: ORAL_TABLET | ORAL | Status: DC

## 2013-08-26 NOTE — Patient Instructions (Signed)
How to Increase Fiber in the Meal Plan for Diabetes Increasing fiber in the diet has many benefits including lowering blood cholesterol, helping to control blood glucose (sugar), preventing constipation, and aiding in weight management by helping you feel full longer. Start adding fiber to your diet slowly. A gradual substitution of Milillo-fiber foods for low-fiber foods will allow the digestive tract to adjust. Most men under 63 years of age should aim to eat 38 g of fiber a day. Women should aim for 25 g. Over 63 years of age, most men need 30 g of fiber and most women need 21 g. Below are some suggestions for increasing fiber.  Try whole-wheat bread instead of white bread. Look for words Glazier on the list of ingredients, such as whole wheat, whole rye, or whole oats.  Try a baked potato with skin instead of mashed potatoes.  Try a fresh apple with skin instead of applesauce.  Try a fresh orange instead of orange juice.  Try popcorn instead of potato chips.  Try bran cereal instead of corn flakes.  Try kidney, whole pinto, or garbanzo beans instead of bread.  Try whole-grain crackers instead of saltine crackers.  Try whole-wheat pasta instead of regular varieties. While on a Bossman-fiber diet:   Drink enough water and fluids to keep your urine clear or pale yellow.  Eat a variety of Schoeppner fiber foods such as fruits, vegetables, whole grains, nuts, and seeds.  Aim for 5 servings of fruit and vegetables per day.  Try to increase your intake of fiber by eating Hunzeker-fiber foods instead of taking fiber supplements that contain small amounts of fiber. There can be additional benefits for long-term health and blood glucose control with Beegle-fiber foods.  SOURCES OF FIBER The following list shows the average dietary fiber for types of food in the various food groups. Starches and Breads / Dietary Fiber (g)  Whole-grain bread, 1 slice / 2 g  Whole-grain cereals,  cup / 3 g  Bran cereals,   to  cup / 8 g  Starchy vegetables,  cup / 3 g  Oatmeal,  cup / 2 g  Whole-wheat pasta,  cup / 2 g  Brown rice,  cup / 2 g  Barley,  cup / 3 g Legumes / Dietary Fiber (g)  Beans,  cup / 8 g  Peas,  cup / 8 g  Lentils ,  cup / 8 g Meat and Meat Substitutes / Dietary Fiber (g) This group averages 0 grams of fiber. Exceptions are:  Nuts, seeds, 1 oz or  cup / 3 g  Chunky peanut butter, 2 tbs / 3 g Vegetables / Dietary Fiber (g)  Cooked vegetables,  to  cup / 2 to 3 g  Raw vegetables, 1 to 2 cups / 2 to 3 g Fruit / Dietary Fiber (g)  Raw or cooked fruit,  cup or 1 small, fresh piece / 2 g Milk / Dietary Fiber (g)  Milk, 1 cup or 8 oz / 0 g Fats and Oils / Dietary Fiber (g)  Fats and oils, 1 tsp / 0 g You can determine how much fiber you are eating by reading the Nutrition Facts panel on the labels of the foods you eat. FIBER IN SPECIFIC FOODS Cereals / Dietary Fiber (g)  All Bran,  cup / 9 g  All Bran with Extra Fiber,  cup / 13 g  Bran Flakes,  cup / 4 g  Cheerios,  cup / 1.5 g    Corn Bran,  cup / 4 g  Corn Flakes,  cup / 0.75 g  Cracklin' Oat Bran,  cup / 4 g  Fiber One,  cup / 13 g  Grape Nuts, 3 tbs / 3 g  Grape Nuts Flakes,  cup / 3 g  Noodles,  cup, cooked / 0.5 g  Nutrigrain Wheat,  cup / 3.5 g  Oatmeal,  cup, cooked / 1.1 g  Pasta, white (macaroni, spaghetti),  cup, cooked / 0.5 g  Pasta, whole-wheat (macaroni, spaghetti),  cup, cooked / 2 g  Ralston,  cup, cooked / 3 g  Rice, wild,  cup, cooked / 0.5 g  Rice, brown,  cup, cooked / 1 g  Rice, white,  cup, cooked / 0.2 g  Shredded Wheat, bite-sized,  cup / 2 g  Total,  cup / 1.75 g  Wheat Chex,  cup / 2.5 g  Wheatena,  cup, cooked / 4 g  Wheaties,  cup / 2.75 g Bread, Starchy Vegetables, and Dried Peas and Beans / Dietary Fiber (g)  Bagel, whole / 0.6 g  Baked beans in tomato sauce,  cup, cooked / 3 g  Bran muffin, 1 small  / 2.5 g  Bread, cracked wheat, 1 slice / 2.5 g  Bread, pumpernickel, 1 slice / 2.5 g  Bread, white, 1 slice / 0.4 g  Bread, whole-wheat, 1 slice / 1.4 g  Corn,  cup, canned / 2.9 g  Kidney beans,  cup, cooked / 3.5 g  Lentils, cup, cooked / 3 g  Lima beans,  cup, cooked / 4 g  Navy beans,  cup, cooked / 4 g  Peas,  cup, cooked / 4 g  Popcorn, 3 cups popped, unbuttered / 3.5 g  Potato, baked (with skin), 1 small / 4 g  Potato, baked (without skin), 1 small / 2 g  Ry-Krisp, 4 crackers / 3 g  Saltine crackers, 6 squares / 0 g  Split peas,  cup, cooked / 2.5 g  Yams (sweet potato),  cup / 1.7 g Fruit / Dietary Fiber (g)  Apple, 1 small, fresh, with skin / 4 g  Apple juice,  cup / 0.4 g  Apricots, 4 medium, fresh / 4 g  Apricots, 7 halves, dried / 2 g  Banana,  medium / 1.2 g  Blueberries,  cup / 2 g  Cantaloupe, melon / 1.3 g  Cherries,  cup, canned / 1.4 g  Grapefruit,  medium / 1.6 g  Grapes, 15 small / 1.2 g  Grape juice,  cup / 0.5 g  Orange, 1 medium, fresh / 2 g  Orange juice,  cup / 0.5 g  Peach, 1 medium,fresh, with skin / 2 g  Pear, 1 medium, fresh, with skin / 4 g  Pineapple, cup, canned / 0.7 g  Plums, 2 whole / 2 g  Prunes, 3 whole / 1.5 g  Rasp    berries, 1 cup / 6 g  Strawberries, 1  cup / 4 g  Watermelon, 1  cup / 0.5 g Vegetables / Dietary Fiber (g)  Asparagus,  cup, cooked / 1 g  Beans, green and wax,  cup, cooked / 1.6 g  Beets,  cup, cooked / 1.8 g  Broccoli,  cup, cooked / 2.2 g  Brussels sprouts,  cup, cooked / 4 g  Cabbage,  cup, cooked / 2.5 g  Carrots,  cup, cooked / 2.3 g  Cauliflower,  cup, cooked / 1.1 g  Celery,  1 cup, raw / 1.5 g  Cucumber, 1 cup, raw / 0.8 g  Green pepper,  cup sliced, cooked / 1.5 g  Lettuce, 1 cup, sliced / 0.9 g  Mushrooms, 1 cup sliced, raw / 1.8 g  Onion, 1 cup sliced, raw / 1.6 g  Spinach,  cup, cooked / 2.4 g  Tomato, 1 medium,  fresh / 1.5 g  Tomato juice,  cup / 0 g  Zucchini,  cup, cooked / 1.8 g Document Released: 11/22/2001 Document Revised: 09/27/2012 Document Reviewed: 12/19/2008 ExitCare Patient Information 2014 [place]ExitCare, [place]LL[place].    Exercise to Lose Weight Exercise and a healthy diet may help you lose weight. Your doctor may suggest specific exercises. EXERCISE IDEAS AND TIPS  Choose low-cost things you enjoy doing, such as walking, bicycling, or exercising to workout videos.  Take stairs instead of the elevator.  Walk during your lunch break.  Park your car further away from work or school.  Go to a gym or an exercise class.  Start with 5 to 10 minutes of exercise each day. Build up to 30 minutes of exercise 4 to 6 days a week.  Wear shoes with good support and comfortable clothes.  Stretch before and after working out.  Work out until you breathe harder and your heart beats faster.  Drink extra water when you exercise.  Do not do so much that you hurt yourself, feel dizzy, or get very short of breath. Exercises that burn about 150 calories:  Running 1  miles in 15 minutes.  Playing volleyball for 45 to 60 minutes.  Washing and waxing a car for 45 to 60 minutes.  Playing touch football for 45 minutes.  Walking 1  miles in 35 minutes.  Pushing a stroller 1  miles in 30 minutes.  Playing basketball for 30 minutes.  Raking leaves for 30 minutes.  Bicycling 5 miles in 30 minutes.  Walking 2 miles in 30 minutes.  Dancing for 30 minutes.  Shoveling snow for 15 minutes.  Swimming laps for 20 minutes.  Walking up stairs for 15 minutes.  Bicycling 4 miles in 15 minutes.  Gardening for 30 to 45 minutes.  Jumping rope for 15 minutes.  Washing windows or floors for 45 to 60 minutes. Document Released: 07/05/2010 Document Revised: 08/25/2011 Document Reviewed: 07/05/2010 [facility] Patient Information 2014 [place]ExitCare, [place]LLC.

## 2013-08-27 LAB — THYROID PROFILE - CHCC
Free Thyroxine Index: 2.1 (ref 1.0–3.9)
T3 Uptake: 32 % (ref 22.5–37.0)
T4, Total: 6.6 ug/dL (ref 5.0–12.5)

## 2013-08-28 LAB — COMPLETE METABOLIC PANEL WITH GFR
ALT: 50 U/L — ABNORMAL HIGH (ref 0–35)
AST: 44 U/L — ABNORMAL HIGH (ref 0–37)
Albumin: 4.1 g/dL (ref 3.5–5.2)
Alkaline Phosphatase: 94 U/L (ref 39–117)
BUN: 16 mg/dL (ref 6–23)
CO2: 20 mEq/L (ref 19–32)
Calcium: 9.5 mg/dL (ref 8.4–10.5)
Chloride: 100 mEq/L (ref 96–112)
Creat: 0.81 mg/dL (ref 0.50–1.10)
GFR, Est African American: >89 mL/min
GFR, Est Non African American: 78 mL/min
Glucose, Bld: 309 mg/dL — ABNORMAL HIGH (ref 70–99)
Potassium: 4 mEq/L (ref 3.5–5.3)
Sodium: 136 mEq/L (ref 135–145)
Total Bilirubin: 0.4 mg/dL (ref 0.2–1.2)
Total Protein: 7 g/dL (ref 6.0–8.3)

## 2013-08-28 LAB — LIPID PANEL
Cholesterol: 183 mg/dL (ref 0–200)
HDL: 42 mg/dL (ref >39)
LDL Cholesterol: 98 mg/dL (ref 0–99)
Total CHOL/HDL Ratio: 4.4 Ratio
Triglycerides: 213 mg/dL — ABNORMAL HIGH (ref <150)
VLDL: 43 mg/dL — ABNORMAL HIGH (ref 0–40)

## 2013-08-29 ENCOUNTER — Other Ambulatory Visit: Payer: Self-pay | Admitting: Emergency Medicine

## 2013-08-29 ENCOUNTER — Encounter: Payer: Self-pay | Admitting: Family Medicine

## 2013-08-29 NOTE — Progress Notes (Signed)
S:  This 63 y.o. AA female is here for Diabetes follow-up; she is compliant w/ medications and but unable to check FSBS daily. Needs a new glucometer. States trying to follow-meal plan but not very active during winter months. Her weight has remained stable. She has eye exam at least twice a year.  HTN- taking medications and denies diaphoresis, fatigue, vision disturbances, CP or tightness, palpitations, SOB or DOE, cough, edema, abd or back pain, HA, dizziness, facial asymmetry, numbness or weakness or syncope. She continues to have some GI upset/ reflux relieved with Sulcrafate and occasional Omeprazole.  Pt is concerned about her weight and plans to begin exercise program this spring. She will attend classes and monitor her nutrition much more closely.  Patient Active Problem List   Diagnosis Date Noted  . Diabetic retinopathy 02/03/2012  . HTN (hypertension) 01/12/2012  . Obesity, Class II, BMI 35-39.9, with comorbidity 01/12/2012  . Meralgia paresthetica    Prior to Admission medications   Medication Sig Start Date End Date Taking? Authorizing Provider  Ascorbic Acid (VITAMIN C) 1000 MG tablet Take 1,000 mg by mouth daily.   Yes Historical Provider, MD  insulin lispro (HUMALOG KWIKPEN) 100 unit/mL SOLN Inject 50 Units into the skin once.    Yes Historical Provider, MD  lansoprazole (PREVACID) 30 MG capsule Take 1 capsule (30 mg total) by mouth daily at 12 noon. 07/21/13  Yes Ellison Carwin, MD  losartan-hydrochlorothiazide (HYZAAR) 100-25 MG per tablet Take 1 tablet by mouth daily.   Yes Barton Fanny, MD  metFORMIN (GLUCOPHAGE) 1000 MG tablet Take 1 tablet daily with morning meal.   Yes Barton Fanny, MD  Multiple Vitamin (MULTIVITAMIN) tablet Take 1 tablet by mouth daily.   Yes Historical Provider, MD  omeprazole (PRILOSEC) 20 MG capsule Take 1 capsule (20 mg total) by mouth 2 (two) times daily. 07/24/13  Yes Theda Sers, PA-C  sucralfate (CARAFATE) 1 G tablet 1 tab 1 hr  ac and hs 07/21/13  Yes Ellison Carwin, MD          PMHx, Surg Hx, Soc and Fam Hx reviewed.  ROS: As per HPI.  O: Filed Vitals:   08/26/13 1453  BP: 139/79  Pulse: 78  Temp: 98.4 F (36.9 C)  Resp: 16   GEN: In NAD: WN,WD. HENT: [place]/AT; EOMI w/ clear conj/sclerae. EACs/nose/oroph normal and clear. COR: RRR. No edema. LUNGS: Normal resp rate and effort. SKIN: W&D; intact w/o erythema or diaphoresis. MS: MAEs; no deformities or muscle strophy. NEURO: A&O x 3; CNs intact. Nonfocal.  A1c= 9.4%  A/P: Diabetes mellitus due to underlying condition with mild nonproliferative diabetic retinopathy without macular edema - Encouraged more aggressive dietary restrictions and more physical activity. Pt advised to reduce portion sizes, avoid artificial sweeteners and concentrated sweets, excessive amounts of starchy and processed foods. Plan: HM Diabetes Foot Exam, POCT glycosylated hemoglobin (Hb A1C), POCT glucose (manual entry)  HTN (hypertension) - Stable and controlled on current medications.  Plan: Thyroid Profile, Lipid panel, COMPLETE METABOLIC PANEL WITH GFR  Obesity, Class II, BMI 35-39.9, with comorbidity- Weight loss goal= 1/2 to 1 pound per week.  Meds ordered this encounter  Medications  . losartan-hydrochlorothiazide (HYZAAR) 100-25 MG per tablet    Sig: Take 1 tablet by mouth daily.    Dispense:  90 tablet    Refill:  3  . metFORMIN (GLUCOPHAGE) 1000 MG tablet    Sig: Take 1 tablet daily with morning meal.    Dispense:  90  tablet    Refill:  3  . glucose monitoring kit (FREESTYLE) monitoring kit    Sig: 1 each by Does not apply route as needed for other.    Dispense:  1 each    Refill:  0

## 2013-08-30 ENCOUNTER — Other Ambulatory Visit: Payer: Self-pay

## 2013-08-31 ENCOUNTER — Telehealth: Payer: Self-pay

## 2013-08-31 NOTE — Progress Notes (Signed)
Quick Note:  Please advise pt regarding following labs...  Blood sugar is above normal as expected. Some liver function tests are above normal; this is probably due to "fatty liver" which is very common in Diabetics and persons who are overweight. Some of the lipid values are abnormal, again related to the uncontrolled Diabetes. Thyroid function tests are normal.  Copy to pt. ______

## 2013-08-31 NOTE — Telephone Encounter (Signed)
Patient wants to know if it is necessary to continue with the Sucralfate medication. She knows she needs an appointment for this medication to be refilled however if she has to come back in to get it refilled she wants to discontinue it.  [phone]

## 2013-09-01 MED ORDER — SUCRALFATE 1 G PO TABS: ORAL_TABLET | ORAL | Status: DC

## 2013-09-01 NOTE — Telephone Encounter (Signed)
LMOM for pt that she is not due for OV until CPE needed in July per Dr McPherson't OV notes and notes indicate pt's acid reflux is responding well to sucralfate. Advised I will send her in RFs and she should call back if there is some other reason she wanted to DC the use. RFs sent.

## 2013-09-16 NOTE — Telephone Encounter (Signed)
Opened in error

## 2013-11-21 ENCOUNTER — Other Ambulatory Visit: Payer: Self-pay

## 2013-11-21 MED ORDER — LANCETS MISC: Status: DC

## 2013-11-21 MED ORDER — GLUCOSE BLOOD VI STRP: ORAL_STRIP | Status: DC

## 2013-11-21 MED ORDER — BLOOD GLUCOSE MONITOR KIT: PACK | Status: DC

## 2013-12-16 ENCOUNTER — Encounter: Payer: 59 | Admitting: Family Medicine

## 2014-02-14 ENCOUNTER — Encounter: Payer: 59 | Admitting: Family Medicine

## 2014-03-11 ENCOUNTER — Other Ambulatory Visit: Payer: Self-pay | Admitting: Family Medicine

## 2014-03-14 ENCOUNTER — Other Ambulatory Visit: Payer: Self-pay | Admitting: Family Medicine

## 2014-05-04 ENCOUNTER — Ambulatory Visit: Payer: 59 | Admitting: Family Medicine

## 2014-05-18 ENCOUNTER — Encounter: Payer: Self-pay | Admitting: Family Medicine

## 2014-05-18 ENCOUNTER — Ambulatory Visit (INDEPENDENT_AMBULATORY_CARE_PROVIDER_SITE_OTHER): Payer: 59 | Admitting: Family Medicine

## 2014-05-18 VITALS — BP 141/83 | HR 66 | Temp 97.5°F | Resp 16 | Ht 62.0 in | Wt 192.0 lb

## 2014-05-18 DIAGNOSIS — T7840XA Allergy, unspecified, initial encounter: Secondary | ICD-10-CM

## 2014-05-18 DIAGNOSIS — I10 Essential (primary) hypertension: Secondary | ICD-10-CM

## 2014-05-18 DIAGNOSIS — E083299 Diabetes mellitus due to underlying condition with mild nonproliferative diabetic retinopathy without macular edema, unspecified eye: Secondary | ICD-10-CM

## 2014-05-18 DIAGNOSIS — E08329 Diabetes mellitus due to underlying condition with mild nonproliferative diabetic retinopathy without macular edema: Secondary | ICD-10-CM

## 2014-05-18 DIAGNOSIS — Z13220 Encounter for screening for lipoid disorders: Secondary | ICD-10-CM

## 2014-05-18 LAB — BASIC METABOLIC PANEL WITH GFR
BUN: 14 mg/dL (ref 6–23)
CO2: 25 mEq/L (ref 19–32)
Calcium: 9.1 mg/dL (ref 8.4–10.5)
Chloride: 102 mEq/L (ref 96–112)
Creat: 0.63 mg/dL (ref 0.50–1.10)
GFR, Est African American: >89 mL/min
GFR, Est Non African American: >89 mL/min
Glucose, Bld: 222 mg/dL — ABNORMAL HIGH (ref 70–99)
Potassium: 4.4 mEq/L (ref 3.5–5.3)
Sodium: 139 mEq/L (ref 135–145)

## 2014-05-18 LAB — LIPID PANEL
Cholesterol: 154 mg/dL (ref 0–200)
HDL: 36 mg/dL — ABNORMAL LOW (ref >39)
LDL Cholesterol: 99 mg/dL (ref 0–99)
Total CHOL/HDL Ratio: 4.3 Ratio
Triglycerides: 96 mg/dL (ref <150)
VLDL: 19 mg/dL (ref 0–40)

## 2014-05-18 LAB — POCT URINALYSIS DIPSTICK
Bilirubin, UA: NEGATIVE
Blood, UA: NEGATIVE
Glucose, UA: 100
Ketones, UA: NEGATIVE
Leukocytes, UA: NEGATIVE
Nitrite, UA: NEGATIVE
Spec Grav, UA: 1.02
Urobilinogen, UA: 0.2
pH, UA: 5.5

## 2014-05-18 LAB — POCT GLYCOSYLATED HEMOGLOBIN (HGB A1C): Hemoglobin A1C: 11.4

## 2014-05-18 LAB — ALT: ALT: 26 U/L (ref 0–35)

## 2014-05-18 MED ORDER — METFORMIN HCL 1000 MG PO TABS: ORAL_TABLET | ORAL | Status: DC

## 2014-05-18 MED ORDER — INSULIN GLARGINE 100 UNIT/ML SOLOSTAR PEN: 20.0000 [IU] | PEN_INJECTOR | Freq: Every day | SUBCUTANEOUS | Status: DC

## 2014-05-18 NOTE — Patient Instructions (Addendum)
Your A1c= 11.4%.  This indicates poorly controlled Diabetes. I have prescribed the Lantus Solostar. Start with 20 units at bedtime (around 10 PM) every night. The goal is to get your fasting blood sugar to 100-120. You need to check blood sugars 2-3 times a day. Increase Lantus by 5 units every 2 weeks if your fasting sugar is above 130. Do not increase Lantus to greater than 35 units. The Humalog is reduced to 5-10 units with 2 main meals daily. This dose dose not change.  Metformin should be taken twice a day with main meals.  Further changes will be made in medications when you return.  Contact me if you have any problems with any of your medications or if your blood sugar goes below 80.

## 2014-05-18 NOTE — Progress Notes (Signed)
Subjective:    Patient ID: Kristen [name], female    DOB: [birth year], 63 y.o.   MRN: [id number]  HPI  This 63 y.o.  AA female has HTN and Type II DM, requiring Insulin and Metformin.  Home BP monitoring reported as "good". Pt denies medication intolerance, fatigue, vision disturbance, CP or tightness, palpitations, SOB or cough, HA, dizziness, numbness or weakness. She reports FSBS daily 130-320 range with 1 hypoglycemic value= 49. NO syncope. Compliance w/ medications is good though she is not consistent with timing of administration. She is not following a meal plan; she is walking for 30 minutes most days of the week. Pt requests one of the newer Insulins advertised on television; she also requests Lantus Solostar, which she has used in the past.   Pt declines vaccines today; she will review some print material re: "Adult Vaccines".  Pt c/o allergic reaction to hair dye. She has pruritis starting on her arms and spreading to her legs. Pt did not do a patch test per package instructions prior to applying dye. The skin irritation is less in intensity but does flare when she gets upset or anxious.   Patient Active Problem List   Diagnosis Date Noted  . Diabetic retinopathy 02/03/2012  . HTN (hypertension) 01/12/2012  . Obesity, Class II, BMI 35-39.9, with comorbidity 01/12/2012  . Meralgia paresthetica    Prior to Admission medications   Medication Sig Start Date End Date Taking? Authorizing Provider  Ascorbic Acid (VITAMIN C) 1000 MG tablet Take 1,000 mg by mouth daily.   Yes Historical Provider, MD  Blood Glucose Monitoring Suppl (BLOOD GLUCOSE METER KIT AND SUPPLIES) KIT Test blood sugar as directed. 11/21/13  Yes Barton Fanny, MD  glucose blood test strip Use as instructed 11/21/13  Yes Barton Fanny, MD  glucose monitoring kit (FREESTYLE) monitoring kit 1 each by Does not apply route as needed for other. 08/26/13  Yes Barton Fanny, MD  insulin lispro (HUMALOG KWIKPEN) 100  unit/mL SOLN Inject 5o Units into the skin once a day.   Yes Historical Provider, MD  Lancets MISC Test blood sugar as directed. 11/21/13  Yes Barton Fanny, MD  lansoprazole (PREVACID) 30 MG capsule Take 1 capsule (30 mg total) by mouth daily at 12 noon. 07/21/13  Yes Roselee Culver, MD  losartan-hydrochlorothiazide (HYZAAR) 100-25 MG per tablet Take 1 tablet by mouth daily. 08/26/13  Yes Barton Fanny, MD  metFORMIN (GLUCOPHAGE) 1000 MG tablet Take 1 tablet once a day with a main meal.   Yes Barton Fanny, MD  Multiple Vitamin (MULTIVITAMIN) tablet Take 1 tablet by mouth daily.   Yes Historical Provider, MD  omeprazole (PRILOSEC) 20 MG capsule Take 1 capsule (20 mg total) by mouth 2 (two) times daily. 07/24/13  Yes Theda Sers, PA-C  sucralfate (CARAFATE) 1 G tablet 1 tab 1 hr ac and hs 09/01/13  Yes Barton Fanny, MD    Review of Systems  Constitutional: Negative.   Eyes: Negative.   Respiratory: Negative for cough, chest tightness and shortness of breath.   Cardiovascular: Negative.   Gastrointestinal: Negative for nausea, abdominal pain and diarrhea.  Musculoskeletal: Negative.   Skin: Positive for pallor.       Allergic reaction.  Neurological: Negative.   Psychiatric/Behavioral: Negative.        Objective:   Physical Exam  Constitutional: She is oriented to person, place, and time. She appears well-developed and well-nourished. No distress.  HENT:  Head:  Normocephalic and atraumatic.  Right Ear: External ear normal.  Left Ear: External ear normal.  Mouth/Throat: Oropharynx is clear and moist. No oropharyngeal exudate.  Eyes: Conjunctivae and EOM are normal. Pupils are equal, round, and reactive to light. No scleral icterus.  Neck: Normal range of motion. Neck supple.  Cardiovascular: Normal rate, regular rhythm and normal heart sounds.   Pulmonary/Chest: Effort normal and breath sounds normal. No respiratory distress.  Neurological: She is alert and  oriented to person, place, and time. She has normal reflexes. No cranial nerve deficit.  Skin: Skin is warm and dry. No rash noted. She is not diaphoretic. There is erythema.  Psychiatric: She has a normal mood and affect. Her behavior is normal. Judgment and thought content normal.  Nursing note and vitals reviewed.   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 Pt given instructions re: dose increase. Humalog 5-10 units to be given with 2 meals daily. Increase Metformin 1000 mg to 1 tab bid with main meals. FSBS 2-3 times daily. Continue lifestyle changes. Contact office with any questions or problems. Plan: HM Diabetes Foot Exam, POCT glycosylated hemoglobin (Hb A1C), POCT urinalysis dipstick, ALT  Essential hypertension - Plan: ALT, BASIC METABOLIC PANEL WITH GFR  Screening, lipid - Plan: Lipid panel  Allergic reaction, initial encounter- Pt states she will not be using any more OTC hair dye products.  Meds ordered this encounter  Medications  . Insulin Glargine (LANTUS SOLOSTAR) 100 UNIT/ML Solostar Pen    Sig: Inject 20 Units into the skin daily at 10 pm.    Dispense:  15 mL    Refill:  1  . metFORMIN (GLUCOPHAGE) 1000 MG tablet    Sig: Take 1 tablet twice a day with main meals.    Dispense:  180 tablet    Refill:  3

## 2014-05-18 NOTE — Progress Notes (Signed)
Quick Note:  Please advise pt regarding following labs... All your labs look good except for the elevated blood sugar. Kidney and liver function tests are normal.  Copy to pt. ______

## 2014-05-19 ENCOUNTER — Other Ambulatory Visit: Payer: Self-pay

## 2014-05-19 DIAGNOSIS — Z1231 Encounter for screening mammogram for malignant neoplasm of breast: Secondary | ICD-10-CM

## 2014-06-01 ENCOUNTER — Telehealth: Payer: Self-pay

## 2014-06-01 NOTE — Telephone Encounter (Signed)
Patient called and states needs a script for "pen needles"

## 2014-06-05 MED ORDER — PEN NEEDLES 31G X 6 MM MISC: 2.0000 | Freq: Every day | Status: DC

## 2014-06-05 NOTE — Telephone Encounter (Signed)
Sent in script- LM to advise pt.

## 2014-06-12 ENCOUNTER — Ambulatory Visit
Admission: RE | Admit: 2014-06-12 | Discharge: 2014-06-12 | Disposition: A | Discharge status: Home / Self Care | Payer: 59 | Source: Ambulatory Visit

## 2014-06-12 DIAGNOSIS — Z1231 Encounter for screening mammogram for malignant neoplasm of breast: Secondary | ICD-10-CM

## 2014-06-28 ENCOUNTER — Telehealth: Payer: Self-pay

## 2014-06-28 NOTE — Telephone Encounter (Signed)
Pt requesting humalog called in   [facility] phone [phone]    Pharmacy [facility] [address]

## 2014-06-29 MED ORDER — INSULIN LISPRO 100 UNIT/ML (KWIKPEN): 5.0000 [IU] | PEN_INJECTOR | Freq: Two times a day (BID) | SUBCUTANEOUS | Status: DC

## 2014-06-29 NOTE — Telephone Encounter (Signed)
Humalog routed to pharmacy per pt request.

## 2014-06-29 NOTE — Telephone Encounter (Signed)
Please advise 

## 2014-08-03 ENCOUNTER — Other Ambulatory Visit: Payer: Self-pay | Admitting: Family Medicine

## 2014-08-17 ENCOUNTER — Ambulatory Visit: Payer: 59 | Admitting: Family Medicine

## 2014-08-19 ENCOUNTER — Other Ambulatory Visit: Payer: Self-pay | Admitting: Family Medicine

## 2014-09-02 ENCOUNTER — Other Ambulatory Visit: Payer: Self-pay | Admitting: Family Medicine

## 2014-09-13 ENCOUNTER — Other Ambulatory Visit: Payer: Self-pay | Admitting: Physician Assistant

## 2014-09-13 NOTE — Telephone Encounter (Signed)
LMOM for CB w/f/up plan. Pt was due for f/up 08/17/14.

## 2014-09-14 ENCOUNTER — Other Ambulatory Visit: Payer: Self-pay | Admitting: Physician Assistant

## 2014-09-14 NOTE — Telephone Encounter (Signed)
Called pharm bc pt should still have plenty from her 08/20/14 RF to last her well past her appt w/Dr Audria NineMcPherson on 09/21/14. Dec OV notes instr pt to inject 20 units Qhs and may taper up to max of 35 units. Even at 35 units she should have enough. Pharm reported she did p/up the 3/6 Rx. LMOM for pt to CB to advise how much she is using daily and if she does need this RF?

## 2014-09-14 NOTE — Telephone Encounter (Signed)
Pt has appt sch for 09/21/14. RF of Lantus was sent 08/20/14 15 ml, which should last pt over 2 mos. Denied w/this note in case pharm overlooked this RF.

## 2014-09-18 ENCOUNTER — Telehealth: Payer: Self-pay | Admitting: Family Medicine

## 2014-09-18 NOTE — Telephone Encounter (Signed)
Patient needs a refill on Losartan. [facility] [address]   [phone]

## 2014-09-19 NOTE — Telephone Encounter (Signed)
Left detailed message that if she doesn't have enough Lantus and needs more before her appt in 2 days, to call me back. Otherwise, pt should have plenty of this now.

## 2014-09-19 NOTE — Telephone Encounter (Signed)
Advised pt that we approved a RF req for this on 3/20. She agreed to call and speak to pharm and have them locate and fill this one. Pt will cb if she has any problems with it.

## 2014-09-21 ENCOUNTER — Encounter: Payer: Self-pay | Admitting: Family Medicine

## 2014-09-21 ENCOUNTER — Ambulatory Visit (INDEPENDENT_AMBULATORY_CARE_PROVIDER_SITE_OTHER): Payer: 59 | Admitting: Family Medicine

## 2014-09-21 ENCOUNTER — Telehealth: Payer: Self-pay

## 2014-09-21 VITALS — BP 124/90 | HR 78 | Temp 98.3°F | Resp 16 | Ht 61.75 in | Wt 194.8 lb

## 2014-09-21 DIAGNOSIS — I10 Essential (primary) hypertension: Secondary | ICD-10-CM | POA: Diagnosis not present

## 2014-09-21 DIAGNOSIS — E119 Type 2 diabetes mellitus without complications: Secondary | ICD-10-CM | POA: Diagnosis not present

## 2014-09-21 DIAGNOSIS — IMO0001 Reserved for inherently not codable concepts without codable children: Secondary | ICD-10-CM

## 2014-09-21 LAB — COMPLETE METABOLIC PANEL WITH GFR
ALT: 33 U/L (ref 0–35)
AST: 32 U/L (ref 0–37)
Albumin: 4.4 g/dL (ref 3.5–5.2)
Alkaline Phosphatase: 61 U/L (ref 39–117)
BUN: 12 mg/dL (ref 6–23)
CO2: 25 mEq/L (ref 19–32)
Calcium: 9 mg/dL (ref 8.4–10.5)
Chloride: 100 mEq/L (ref 96–112)
Creat: 0.62 mg/dL (ref 0.50–1.10)
GFR, Est African American: >89 mL/min
GFR, Est Non African American: >89 mL/min
Glucose, Bld: 224 mg/dL — ABNORMAL HIGH (ref 70–99)
Potassium: 4 mEq/L (ref 3.5–5.3)
Sodium: 138 mEq/L (ref 135–145)
Total Bilirubin: 0.5 mg/dL (ref 0.2–1.2)
Total Protein: 6.9 g/dL (ref 6.0–8.3)

## 2014-09-21 LAB — POCT GLYCOSYLATED HEMOGLOBIN (HGB A1C): Hemoglobin A1C: 9.5

## 2014-09-21 MED ORDER — LOSARTAN POTASSIUM-HCTZ 100-25 MG PO TABS: ORAL_TABLET | ORAL | Status: DC

## 2014-09-21 MED ORDER — METFORMIN HCL 1000 MG PO TABS: ORAL_TABLET | ORAL | Status: DC

## 2014-09-21 MED ORDER — PEN NEEDLES 31G X 6 MM MISC: 2.0000 | Freq: Every day | Status: DC

## 2014-09-21 MED ORDER — INSULIN LISPRO 100 UNIT/ML (KWIKPEN): 20.0000 [IU] | PEN_INJECTOR | Freq: Two times a day (BID) | SUBCUTANEOUS | Status: DC

## 2014-09-21 MED ORDER — INSULIN GLARGINE 100 UNIT/ML SOLOSTAR PEN: PEN_INJECTOR | SUBCUTANEOUS | Status: DC

## 2014-09-21 NOTE — Telephone Encounter (Signed)
[facility] mail order reqs RFs of Metformin and losartan HCTZ. I see that pt has an appt w/you today, Dr Audria NineMcPherson. Can you please send in the appropriate # of RFs for pt? Thank you! [facility]

## 2014-09-21 NOTE — Telephone Encounter (Signed)
I spoke with pt about using [facility] mail order for these 2 meds and she is okay with this.

## 2014-09-21 NOTE — Patient Instructions (Signed)

## 2014-09-24 NOTE — Progress Notes (Signed)
Quick Note:  Please advise pt regarding following labs...  Metabolic panel is normal except for elevated blood sugar (as we discussed at last clinic visit).  Copy to pt. ______

## 2014-09-25 ENCOUNTER — Encounter: Payer: Self-pay | Admitting: *Deleted

## 2014-09-26 NOTE — Progress Notes (Signed)
S:  This 64 y.o. Female has Type II DM, compliant w/ Insulin and oral medications. Last A1c= 11.4% in Dec 2015.  FSBS 120-150 in last 30-45 days; no hypoglycemia. Pt feels well but is concerned about weight. HTN is well controlled w/o adverse medication affects. Pt denies diaphoresis, fatigue, abnormal weight change, vision disturbances, CP or tightness, palpitations, SOB or DOE, edema, cough, HA, dizziness, weakness, numbness or syncope.  Patient Active Problem List   Diagnosis Date Noted  . Diabetic retinopathy 02/03/2012  . HTN (hypertension) 01/12/2012  . Obesity, Class II, BMI 35-39.9, with comorbidity 01/12/2012  . Meralgia paresthetica     Prior to Admission medications   Medication Sig Start Date End Date Taking? Authorizing Provider  Ascorbic Acid (VITAMIN C) 1000 MG tablet Take 1,000 mg by mouth daily.   Yes Historical Provider, MD  Blood Glucose Monitoring Suppl (BLOOD GLUCOSE METER KIT AND SUPPLIES) KIT Test blood sugar as directed. 11/21/13  Yes Barton Fanny, MD  Calcium Carbonate-Vit D-Min (CALCIUM 1200 PO) Take by mouth.   Yes Historical Provider, MD  glucose blood test strip Use as instructed 11/21/13  Yes Barton Fanny, MD  glucose monitoring kit (FREESTYLE) monitoring kit 1 each by Does not apply route as needed for other. 08/26/13  Yes Barton Fanny, MD  Insulin Glargine (LANTUS SOLOSTAR) 100 UNIT/ML Solostar Pen INJECT 45 UNITS INTO SKIN DAILY AT 10 PM.   Yes Barton Fanny, MD  insulin lispro (HUMALOG KWIKPEN) 100 UNIT/ML KiwkPen Inject 0.2 mLs (20 Units total) into the skin 2 (two) times daily before a meal.   Yes Barton Fanny, MD  Insulin Pen Needle (PEN NEEDLES) 31G X 6 MM MISC 2 each by Does not apply route daily.   Yes Barton Fanny, MD  Lancets MISC Test blood sugar as directed. 11/21/13  Yes Barton Fanny, MD  losartan-hydrochlorothiazide (HYZAAR) 100-25 MG per tablet TAKE 1 TABLET BY MOUTH DAILY.   Yes Barton Fanny, MD   metFORMIN (GLUCOPHAGE) 1000 MG tablet Take 1 tablet twice a day with main meals.   Yes Barton Fanny, MD  Multiple Vitamin (MULTIVITAMIN) tablet Take 1 tablet by mouth daily.   Yes Historical Provider, MD  sucralfate (CARAFATE) 1 G tablet 1 tab 1 hr ac and hs 09/01/13  Yes Barton Fanny, MD  Vitamin D, Cholecalciferol, 1000 UNITS TABS Take by mouth.   Yes Historical Provider, MD    SURG, SOC and FAM HX reviewed.  ROS: AS per HPI.  O: Filed Vitals:   09/21/14 1625  BP: 124/90  Pulse: 78  Temp: 98.3 F (36.8 C)  Resp: 16    GEN: In NAD: WN, WD. HENT: [place]/AT; EOMI w/ clear conj/sclerae. Otherwise unremarkable. COR: RRR. No edema. LUNGS; Unlabored resp. SKIN; W&D' intact. NEURO: A&O x 3; Cns intact. Nonfocal. See DM FOOT EXAM.  Results for orders placed or performed in visit on 09/21/14  A1c= 9.5%   A/P: Diabetes mellitus without complication - Pt adhering to treatment plan much better. No medication change at this time. Goal A1c is 7.5-8.0% Plan: HM Diabetes Foot Exam, POCT glycosylated hemoglobin (Hb A1C), COMPLETE METABOLIC PANEL WITH GFR  Obesity, Class II, BMI 35-39.9, with comorbidity  Essential hypertension- Stable on current medications; encouraged weight loss.  Meds ordered this encounter  Medications  . metFORMIN (GLUCOPHAGE) 1000 MG tablet    Sig: Take 1 tablet twice a day with main meals.    Dispense:  180 tablet  Refill:  3  . losartan-hydrochlorothiazide (HYZAAR) 100-25 MG per tablet    Sig: TAKE 1 TABLET BY MOUTH DAILY.    Dispense:  90 tablet    Refill:  3  . insulin lispro (HUMALOG KWIKPEN) 100 UNIT/ML KiwkPen    Sig: Inject 0.2 mLs (20 Units total) into the skin 2 (two) times daily before a meal.    Dispense:  15 mL    Refill:  5  . Insulin Glargine (LANTUS SOLOSTAR) 100 UNIT/ML Solostar Pen    Sig: INJECT 45 UNITS INTO SKIN DAILY AT 10 PM.    Dispense:  15 mL    Refill:  5  . Insulin Pen Needle (PEN NEEDLES) 31G X 6 MM MISC     Sig: 2 each by Does not apply route daily.    Dispense:  100 each    Refill:  11

## 2014-10-14 ENCOUNTER — Other Ambulatory Visit: Payer: Self-pay | Admitting: Physician Assistant

## 2015-02-01 ENCOUNTER — Other Ambulatory Visit: Payer: Self-pay | Admitting: Physician Assistant

## 2015-02-28 ENCOUNTER — Other Ambulatory Visit: Payer: Self-pay | Admitting: Physician Assistant

## 2015-04-16 ENCOUNTER — Encounter: Payer: Self-pay | Admitting: Family Medicine

## 2015-04-16 ENCOUNTER — Ambulatory Visit (INDEPENDENT_AMBULATORY_CARE_PROVIDER_SITE_OTHER): Payer: 59 | Admitting: Family Medicine

## 2015-04-16 VITALS — BP 124/82 | HR 68 | Temp 97.9°F | Resp 16 | Ht 61.5 in | Wt 190.0 lb

## 2015-04-16 DIAGNOSIS — I10 Essential (primary) hypertension: Secondary | ICD-10-CM

## 2015-04-16 DIAGNOSIS — E669 Obesity, unspecified: Secondary | ICD-10-CM | POA: Diagnosis not present

## 2015-04-16 DIAGNOSIS — E119 Type 2 diabetes mellitus without complications: Secondary | ICD-10-CM | POA: Diagnosis not present

## 2015-04-16 DIAGNOSIS — IMO0001 Reserved for inherently not codable concepts without codable children: Secondary | ICD-10-CM

## 2015-04-16 DIAGNOSIS — E1169 Type 2 diabetes mellitus with other specified complication: Secondary | ICD-10-CM

## 2015-04-16 LAB — CBC
HCT: 41.2 % (ref 36.0–46.0)
Hemoglobin: 13.7 g/dL (ref 12.0–15.0)
MCH: 29.6 pg (ref 26.0–34.0)
MCHC: 33.3 g/dL (ref 30.0–36.0)
MCV: 89 fL (ref 78.0–100.0)
MPV: 11.1 fL (ref 8.6–12.4)
Platelets: 185 10*3/uL (ref 150–400)
RBC: 4.63 MIL/uL (ref 3.87–5.11)
RDW: 13.2 % (ref 11.5–15.5)
WBC: 6.1 10*3/uL (ref 4.0–10.5)

## 2015-04-16 LAB — LIPID PANEL
Cholesterol: 140 mg/dL (ref 125–200)
HDL: 42 mg/dL — ABNORMAL LOW (ref >=46)
LDL Cholesterol: 76 mg/dL (ref <130)
Total CHOL/HDL Ratio: 3.3 Ratio (ref <=5.0)
Triglycerides: 111 mg/dL (ref <150)
VLDL: 22 mg/dL (ref <30)

## 2015-04-16 LAB — COMPREHENSIVE METABOLIC PANEL
ALT: 31 U/L — ABNORMAL HIGH (ref 6–29)
AST: 22 U/L (ref 10–35)
Albumin: 4 g/dL (ref 3.6–5.1)
Alkaline Phosphatase: 75 U/L (ref 33–130)
BUN: 13 mg/dL (ref 7–25)
CO2: 26 mmol/L (ref 20–31)
Calcium: 9.3 mg/dL (ref 8.6–10.4)
Chloride: 103 mmol/L (ref 98–110)
Creat: 0.69 mg/dL (ref 0.50–0.99)
Glucose, Bld: 195 mg/dL — ABNORMAL HIGH (ref 65–99)
Potassium: 4 mmol/L (ref 3.5–5.3)
Sodium: 139 mmol/L (ref 135–146)
Total Bilirubin: 0.4 mg/dL (ref 0.2–1.2)
Total Protein: 6.5 g/dL (ref 6.1–8.1)

## 2015-04-16 LAB — POCT GLYCOSYLATED HEMOGLOBIN (HGB A1C): Hemoglobin A1C: 9.9

## 2015-04-16 MED ORDER — PEN NEEDLES 31G X 6 MM MISC: 2.0000 | Freq: Every day | Status: DC

## 2015-04-16 MED ORDER — LOSARTAN POTASSIUM-HCTZ 100-25 MG PO TABS: 1.0000 | ORAL_TABLET | Freq: Every day | ORAL | Status: DC

## 2015-04-16 MED ORDER — INSULIN LISPRO 100 UNIT/ML (KWIKPEN): 20.0000 [IU] | PEN_INJECTOR | Freq: Two times a day (BID) | SUBCUTANEOUS | Status: DC

## 2015-04-16 MED ORDER — INSULIN GLARGINE 100 UNIT/ML SOLOSTAR PEN: PEN_INJECTOR | SUBCUTANEOUS | Status: DC

## 2015-04-16 MED ORDER — METFORMIN HCL 1000 MG PO TABS: ORAL_TABLET | ORAL | Status: DC

## 2015-04-16 NOTE — Progress Notes (Signed)
Subjective:    Patient ID: Kristen [name], female    DOB: [birth year], 64 y.o.   MRN: [id number]  HPI This is a pleasant 64 yo female who presents today for follow up of HTN, DM type 2, diabetes, meralgia paresthetica (doesn't bother her often).  She has lost about 10 pounds. She is watching her diet- has decreased her carbohydrate intake. She is planning on starting an exercise program with walking.  Her blood sugars are running 120-130 fasting, she is taking lantus 50 units nightly and Humalog 45-50 units nightly. She has been taking metformin intermittently because it bothers her stomach. She has not had any over the last 6 weeks. She drinks 6 ounces of ginger ale nightly, sweet tea x 1/week. No other sugary drinks.   Past Medical History  Diagnosis Date  . HTN (hypertension)   . Meralgia paresthetica   . Glaucoma   . Diabetes mellitus    Past Surgical History  Procedure Laterality Date  . Breast cyst excision    . Tonsillectomy and adenoidectomy    . Cholecystectomy  06/16/2008   Family History  Problem Relation Age of Onset  . Hypertension Mother   . Diabetes Mother   . Thyroid disease Mother   . Diabetes Father   . Cancer Father     Colon cancer  . Diabetes Sister    Social History  Substance Use Topics  . Smoking status: Never Smoker   . Smokeless tobacco: None  . Alcohol Use: No   Review of Systems  Constitutional: Negative for fever, chills and fatigue.  Respiratory: Negative for shortness of breath.   Cardiovascular: Negative for chest pain and leg swelling.  Gastrointestinal: Positive for nausea (metformin). Negative for diarrhea and constipation.  Endocrine: Positive for polyuria (drinks 10 glasses of water daily to help with appetite). Negative for polydipsia and polyphagia.  Musculoskeletal: Negative for myalgias and arthralgias.      Objective:   Physical Exam Physical Exam  Constitutional: Oriented to person, place, and time. She appears well-developed  and well-nourished.  HENT:  Head: Normocephalic and atraumatic.  Eyes: Conjunctivae are normal.  Neck: Normal range of motion. Neck supple.  Cardiovascular: Normal rate, regular rhythm and normal heart sounds.   Pulmonary/Chest: Effort normal and breath sounds normal.  Musculoskeletal: Normal range of motion. No edema.  Neurological: Alert and oriented to person, place, and time.  Skin: Skin is warm and dry.  Psychiatric: Normal mood and affect. Behavior is normal. Judgment and thought content normal.  Vitals reviewed.  BP 124/82 mmHg  Pulse 68  Temp(Src) 97.9 F (36.6 C)  Resp 16  Ht 5' 1.5" (1.562 m)  Wt 190 lb (86.183 kg)  BMI 35.32 kg/m2 Wt Readings from Last 3 Encounters:  04/16/15 190 lb (86.183 kg)  09/21/14 194 lb 12.8 oz (88.361 kg)  05/18/14 192 lb (87.091 kg)   Results for orders placed or performed in visit on 04/16/15  POCT glycosylated hemoglobin (Hb A1C)  Result Value Ref Range   Hemoglobin A1C 9.9   4/16- HgbA1C 9.2     Assessment & Plan:  1. Essential hypertension - CBC - Comprehensive metabolic panel - Lipid panel - losartan-hydrochlorothiazide (HYZAAR) 100-25 MG tablet; Take 1 tablet by mouth daily.  Dispense: 90 tablet; Refill: 1  2. Obesity, Class II, BMI 35-39.9, with comorbidity (HCC) - CBC - Comprehensive metabolic panel - Lipid panel  3. Diabetes mellitus type 2 in obese (HCC) - POCT glycosylated hemoglobin (Hb A1C) - CBC - Comprehensive  metabolic panel - Lipid panel - Insulin Glargine (LANTUS SOLOSTAR) 100 UNIT/ML Solostar Pen; INJECT 50 UNITS INTO SKIN DAILY AT 10 PM.  Dispense: 15 mL; Refill: 5 - insulin lispro (HUMALOG KWIKPEN) 100 UNIT/ML KiwkPen; Inject 0.2 mLs (20 Units total) into the skin 2 (two) times daily before a meal.  Dispense: 15 mL; Refill: 5 - Insulin Pen Needle (PEN NEEDLES) 31G X 6 MM MISC; 2 each by Does not apply route daily.  Dispense: 100 each; Refill: 11 - metFORMIN (GLUCOPHAGE) 1000 MG tablet; Take 1 tablet twice  a day with main meals.  Dispense: 180 tablet; Refill: 3  - had long discussion with patient regarding meds, weight loss and exercise. She is currently highly motivated. Discussed goal of 1/2 pound per week. She has been taking Humalog once a day at a very high dose. Her fasting blood sugars are ok. Discussed peak of Humalog and need to take 20 units BID and check blood sugar at different times throughout the day. Encouraged her to stop drinking daily ginger ale. She will try to reintroduce metform 1/2 tablet BID. Provided information regarding [facility] [place] [facility] and suggested she try exercise classes.  - follow up 3 months- she will bring exercise/weight/blood sugar log  Olean Reeeborah Carsyn Boster, FNP-BC  [facility], [facility]  04/16/2015 12:31 PM

## 2015-04-16 NOTE — Patient Instructions (Addendum)
[email or web address] - look for exercise options  Keep up the good work with your diet!! Make every calorie count. Aim for 1/2 pound weight loss per wee- 4 pounds before January, then about 25 pounds next year. You will be very close to your goal- slow and steady!   Please keep a log of your weight, exercise and blood sugar  The Humalog insulin works for about 6-8 hours, let's try to split the dose and take in morning and evening.

## 2015-07-17 ENCOUNTER — Ambulatory Visit: Payer: 59 | Admitting: Family Medicine

## 2015-07-18 ENCOUNTER — Encounter: Payer: Self-pay | Admitting: Family Medicine

## 2015-07-18 ENCOUNTER — Ambulatory Visit (INDEPENDENT_AMBULATORY_CARE_PROVIDER_SITE_OTHER): Payer: 59 | Admitting: Family Medicine

## 2015-07-18 VITALS — BP 138/85 | HR 69 | Temp 98.1°F | Resp 16 | Ht 62.0 in | Wt 188.8 lb

## 2015-07-18 DIAGNOSIS — E083299 Diabetes mellitus due to underlying condition with mild nonproliferative diabetic retinopathy without macular edema, unspecified eye: Secondary | ICD-10-CM

## 2015-07-18 DIAGNOSIS — Z794 Long term (current) use of insulin: Secondary | ICD-10-CM | POA: Diagnosis not present

## 2015-07-18 DIAGNOSIS — E119 Type 2 diabetes mellitus without complications: Secondary | ICD-10-CM

## 2015-07-18 LAB — GLUCOSE, POCT (MANUAL RESULT ENTRY): POC Glucose: 170 mg/dl — AB (ref 70–99)

## 2015-07-18 LAB — POCT GLYCOSYLATED HEMOGLOBIN (HGB A1C): Hemoglobin A1C: 8.6

## 2015-07-18 NOTE — Progress Notes (Signed)
Subjective:    Patient ID: Kristen [name], female    DOB: 1950-12-18, 65 y.o.   MRN: [id number]  HPI This is a pleasant 65 yo female who presents today for follow up of DM. She reports that she is exercising more, 3 days a week. She restarted her metformin and is taking 1000 mg twice a day. She is having some stomach upset and is thinking about decreasing it to 1/2 tablet twice a day which she tolerates better.  Blood sugar 120 in the morning and up to 285 after a meal. She doesn't eat carbs during the week and eats carbs on the weekend and can tell a difference in her blood sugars. She tried to divide her Humalog to BID but had trouble remembering to take her am dose. She went back to just taking it in the evening and has had some night time lows. She has noticed increased energy and better sleep with her increased exercise and watching her diet.   She declines flu and pneumonia vaccine today.  Past Medical History  Diagnosis Date  . HTN (hypertension)   . Meralgia paresthetica   . Glaucoma   . Diabetes mellitus    Past Surgical History  Procedure Laterality Date  . Breast cyst excision    . Tonsillectomy and adenoidectomy    . Cholecystectomy  06/16/2008   Family History  Problem Relation Age of Onset  . Hypertension Mother   . Diabetes Mother   . Thyroid disease Mother   . Diabetes Father   . Cancer Father     Colon cancer  . Diabetes Sister    Social History  Substance Use Topics  . Smoking status: Never Smoker   . Smokeless tobacco: None  . Alcohol Use: No   Review of Systems No chest pain, no SOB, no swelling.     Objective:   Physical Exam Physical Exam  Constitutional: Oriented to person, place, and time. She appears well-developed and well-nourished.  HENT:  Head: Normocephalic and atraumatic.  Eyes: Conjunctivae are normal.  Neck: Normal range of motion. Neck supple.  Cardiovascular: Normal rate, regular rhythm and normal heart sounds.   Pulmonary/Chest:  Effort normal and breath sounds normal.  Musculoskeletal: Normal range of motion.  Neurological: Alert and oriented to person, place, and time.  Skin: Skin is warm and dry.  Psychiatric: Normal mood and affect. Behavior is normal. Judgment and thought content normal.  Vitals reviewed.  BP 138/85 mmHg  Pulse 69  Temp(Src) 98.1 F (36.7 C) (Oral)  Resp 16  Ht  (1.575 m)  Wt 188 lb 12.8 oz (85.639 kg)  BMI 34.52 kg/m2  SpO2 100% Wt Readings from Last 3 Encounters:  07/18/15 188 lb 12.8 oz (85.639 kg)  04/16/15 190 lb (86.183 kg)  09/21/14 194 lb 12.8 oz (88.361 kg)   Results for orders placed or performed in visit on 07/18/15  POCT glycosylated hemoglobin (Hb A1C)  Result Value Ref Range   Hemoglobin A1C 8.6   POCT glucose (manual entry)  Result Value Ref Range   POC Glucose 170 (A) 70 - 99 mg/dl      Assessment & Plan:  1. 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 Encouraged continued increased activity and watching diet. Due to her having some night time hypoglycemia and some difficulty with BID Humalog regimen, will refer to  endocrinology.  - POCT glycosylated hemoglobin (Hb A1C) - POCT glucose (manual entry) - Ambulatory referral to Endocrinology  - follow up in 3 months for CPE  Olean Ree, FNP-BC  [facility], [facility]  07/18/2015 11:21 AM

## 2015-07-18 NOTE — Patient Instructions (Signed)
Keep up the good work with healthy food choices and increased exercise!

## 2015-09-04 ENCOUNTER — Telehealth: Payer: Self-pay

## 2015-09-04 NOTE — Telephone Encounter (Addendum)
Pt states she received a call from her insurance stating they will not be covering the Time Release Insulin Lancets any more and will need to get something else. Please call pt at  [phone]    [facility] [address]

## 2015-09-06 ENCOUNTER — Ambulatory Visit (INDEPENDENT_AMBULATORY_CARE_PROVIDER_SITE_OTHER): Payer: Commercial Managed Care - HMO | Admitting: Internal Medicine

## 2015-09-06 ENCOUNTER — Encounter: Payer: Self-pay | Admitting: Internal Medicine

## 2015-09-06 VITALS — BP 122/70 | HR 88 | Temp 97.5°F | Resp 12 | Ht 62.0 in | Wt 186.6 lb

## 2015-09-06 DIAGNOSIS — Z794 Long term (current) use of insulin: Secondary | ICD-10-CM | POA: Diagnosis not present

## 2015-09-06 DIAGNOSIS — E11319 Type 2 diabetes mellitus with unspecified diabetic retinopathy without macular edema: Secondary | ICD-10-CM | POA: Diagnosis not present

## 2015-09-06 MED ORDER — LIRAGLUTIDE 18 MG/3ML ~~LOC~~ SOPN: 1.8000 mg | PEN_INJECTOR | Freq: Every day | SUBCUTANEOUS | Status: DC

## 2015-09-06 MED ORDER — METFORMIN HCL ER 500 MG PO TB24: 500.0000 mg | ORAL_TABLET | Freq: Every day | ORAL | Status: DC

## 2015-09-06 MED ORDER — LANCETS MISC: Status: DC

## 2015-09-06 MED ORDER — GLIPIZIDE 5 MG PO TABS: 5.0000 mg | ORAL_TABLET | Freq: Every day | ORAL | Status: DC

## 2015-09-06 NOTE — Patient Instructions (Signed)
Pease stop regular Metformin and start Metformin ER 500 mg 2x a day.  Stop Humalog and start Basaglar 30 units at bedtime.  Start Glipizide 5 mg before dinner.  Start Victoza 0.6 mg daily in am x 1 week, then increase to 1.2 mg in am x 1 week, then increase to 1.8 mg daily in am.  Let me know about your sugars in 2-3 weeks.  Please return in 1.5 months with your sugar log.   PATIENT INSTRUCTIONS FOR TYPE 2 DIABETES:  **Please join MyChart!** - see attached instructions about how to join if you have not done so already.  DIET AND EXERCISE Diet and exercise is an important part of diabetic treatment.  We recommended aerobic exercise in the form of brisk walking (working between 40-60% of maximal aerobic capacity, similar to brisk walking) for 150 minutes per week (such as 30 minutes five days per week) along with 3 times per week performing 'resistance' training (using various gauge rubber tubes with handles) 5-10 exercises involving the major muscle groups (upper body, lower body and core) performing 10-15 repetitions (or near fatigue) each exercise. Start at half the above goal but build slowly to reach the above goals. If limited by weight, joint pain, or disability, we recommend daily walking in a swimming pool with water up to waist to reduce pressure from joints while allow for adequate exercise.    BLOOD GLUCOSES Monitoring your blood glucoses is important for continued management of your diabetes. Please check your blood glucoses 2-4 times a day: fasting, before meals and at bedtime (you can rotate these measurements - e.g. one day check before the 3 meals, the next day check before 2 of the meals and before bedtime, etc.).   HYPOGLYCEMIA (low blood sugar) Hypoglycemia is usually a reaction to not eating, exercising, or taking too much insulin/ other diabetes drugs.  Symptoms include tremors, sweating, hunger, confusion, headache, etc. Treat IMMEDIATELY with 15 grams of Carbs: . 4  glucose tablets .  cup regular juice/soda . 2 tablespoons raisins . 4 teaspoons sugar . 1 tablespoon honey Recheck blood glucose in 15 mins and repeat above if still symptomatic/blood glucose <100.  RECOMMENDATIONS TO REDUCE YOUR RISK OF DIABETIC COMPLICATIONS: * Take your prescribed MEDICATION(S) * Follow a DIABETIC diet: Complex carbs, fiber rich foods, (monounsaturated and polyunsaturated) fats * AVOID saturated/trans fats, high fat foods, >2,300 mg salt per day. * EXERCISE at least 5 times a week for 30 minutes or preferably daily.  * DO NOT SMOKE OR DRINK more than 1 drink a day. * Check your FEET every day. Do not wear tightfitting shoes. Contact us if you develop an ulcer * See your EYE doctor once a year or more if needed * Get a FLU shot once a year * Get a PNEUMONIA vaccine once before and once after age 6 years  GOALS:  * Your Hemoglobin A1c of <7%  * fasting sugars need to be <130 * after meals sugars need to be <180 (2h after you start eating) * Your Systolic BP should be 140 or lower  * Your Diastolic BP should be 80 or lower  * Your HDL (Good Cholesterol) should be 40 or higher  * Your LDL (Bad Cholesterol) should be 100 or lower. * Your Triglycerides should be 150 or lower  * Your Urine microalbumin (kidney function) should be <30 * Your Body Mass Index should be 25 or lower    Please consider the following ways to cut down carbs  and fat and increase fiber and micronutrients in your diet: - substitute whole grain for white bread or pasta - substitute brown rice for white rice - substitute 90-calorie flat bread pieces for slices of bread when possible - substitute sweet potatoes or yams for white potatoes - substitute humus for margarine - substitute tofu for cheese when possible - substitute almond or rice milk for regular milk (would not drink soy milk daily due to concern for soy estrogen influence on breast cancer risk) - substitute dark chocolate for other  sweets when possible - substitute water - can add lemon or orange slices for taste - for diet sodas (artificial sweeteners will trick your body that you can eat sweets without getting calories and will lead you to overeating and weight gain in the long run) - do not skip breakfast or other meals (this will slow down the metabolism and will result in more weight gain over time)  - can try smoothies made from fruit and almond/rice milk in am instead of regular breakfast - can also try old-fashioned (not instant) oatmeal made with almond/rice milk in am - order the dressing on the side when eating salad at a restaurant (pour less than half of the dressing on the salad) - eat as little meat as possible - can try juicing, but should not forget that juicing will get rid of the fiber, so would alternate with eating raw veg./fruits or drinking smoothies - use as little oil as possible, even when using olive oil - can dress a salad with a mix of balsamic vinegar and lemon juice, for e.g. - use agave nectar, stevia sugar, or regular sugar rather than artificial sweateners - steam or broil/roast veggies  - snack on veggies/fruit/nuts (unsalted, preferably) when possible, rather than processed foods - reduce or eliminate aspartame in diet (it is in diet sodas, chewing gum, etc) Read the labels!  Try to read Dr. Katherina RightNeal Barnard's book: "Program for Reversing Diabetes" for other ideas for healthy eating.

## 2015-09-06 NOTE — Progress Notes (Signed)
Patient ID: Kristen [name], female   DOB: [birth year], 65 y.o.   MRN: [id number]  HPI: Kristen [name] is a 65 y.o.-year-old female, referred by her PCP, Dr. Carlean Purl, for management of DM2, dx in 2000, insulin-dependent since 2012, uncontrolled, with complications (DR).  Last hemoglobin A1c was: Lab Results  Component Value Date   HGBA1C 8.6 07/18/2015   HGBA1C 9.9 04/16/2015   HGBA1C 9.5 09/21/2014   Pt is on a regimen of: - Metformin 500 mg 2 a day with meals >> diarrhea and nausea - Humalog 40 units at bedtime (!) She is supposed to be on Lantus 50 units at bedtime >> not taking >> not covered by insurance. She used to be on Glyburide  - stopped years.  Pt checks her sugars 2x a day and they are: - am: 80-140 - 2h after b'fast: n/c - before lunch: n/c - 2h after lunch: n/c - before dinner: n/c - 2h after dinner: 280-295 - bedtime: n/c - nighttime: 65  Lowest sugar was 65 - 2x a week ; she has hypoglycemia awareness at 70s.  Highest sugar was 360.  Glucometer: AccuChek  Pt's meals are - smaller meals since 04/2015. - Breakfast: 2 boiled eggs, 1/2 grapefruit - Lunch: meat + salad + apple or apple sauce - Dinner: meat + veggies - Snacks: 2 - tuna fish + 6 crackers; peanuts  - no CKD, last BUN/creatinine:  Lab Results  Component Value Date   BUN 13 04/16/2015   CREATININE 0.69 04/16/2015  She is on losartan. - last set of lipids: Lab Results  Component Value Date   CHOL 140 04/16/2015   HDL 42* 04/16/2015   LDLCALC 76 04/16/2015   TRIG 111 04/16/2015   CHOLHDL 3.3 04/16/2015   - last eye exam was in 03/2015. + DR. + Glaucoma. - no numbness and tingling in her feet.  Pt has FH of DM in mother, father, MGM, sister.  She also has a history of GERD, hypertension.  ROS: Constitutional: + weight loss, no fatigue, no subjective hyperthermia/hypothermia Eyes: no blurry vision, no xerophthalmia ENT: no sore throat, no nodules palpated in throat, no dysphagia/odynophagia,  no hoarseness Cardiovascular: no CP/SOB/palpitations/leg swelling Respiratory: no cough/SOB Gastrointestinal: no N/V/+ D/no C/+ acid reflux Musculoskeletal: no muscle/joint aches Skin: no rashes Neurological: no tremors/numbness/tingling/dizziness Psychiatric: no depression/anxiety  Past Medical History  Diagnosis Date  . HTN (hypertension)   . Meralgia paresthetica   . Glaucoma   . Diabetes mellitus    Past Surgical History  Procedure Laterality Date  . Breast cyst excision    . Tonsillectomy and adenoidectomy    . Cholecystectomy  06/16/2008   Social History   Social History  . Marital Status: Widowed    Spouse Name: N/A  . Number of Children: 1   Occupational History  .    Social History Main Topics  . Smoking status: Never Smoker   . Smokeless tobacco: Not on file  . Alcohol Use: No  . Drug Use: No   Current Outpatient Prescriptions on File Prior to Visit  Medication Sig Dispense Refill  . Ascorbic Acid (VITAMIN C) 1000 MG tablet Take 1,000 mg by mouth daily.    . Blood Glucose Monitoring Suppl (BLOOD GLUCOSE METER KIT AND SUPPLIES) KIT Test blood sugar as directed. 1 each 0  . Calcium Carbonate-Vit D-Min (CALCIUM 1200 PO) Take by mouth.    Marland Kitchen glucose blood test strip Use as instructed 100 each 9  . insulin lispro (HUMALOG KWIKPEN) 100 UNIT/ML  KiwkPen Inject 0.2 mLs (20 Units total) into the skin 2 (two) times daily before a meal. (Patient taking differently: Inject 20 Units into the skin at bedtime. ) 15 mL 5  . Insulin Pen Needle (PEN NEEDLES) 31G X 6 MM MISC 2 each by Does not apply route daily. 100 each 11  . Lancets MISC Use as directed. Lancets for meter, which ever insurance prefers 100 each 2  . losartan-hydrochlorothiazide (HYZAAR) 100-25 MG tablet Take 1 tablet by mouth daily. 90 tablet 1  . magnesium 30 MG tablet Take 30 mg by mouth 2 (two) times daily.    . Melatonin 1 MG CAPS Take by mouth. Reported on 07/18/2015    . metFORMIN (GLUCOPHAGE) 1000 MG tablet  Take 1 tablet twice a day with main meals. (Patient taking differently: Take 500 mg by mouth 2 (two) times daily with a meal. Take 1 tablet twice a day with main meals.) 180 tablet 3  . Multiple Vitamin (MULTIVITAMIN) tablet Take 1 tablet by mouth daily.    . sucralfate (CARAFATE) 1 G tablet 1 tab 1 hr ac and hs 120 tablet 3  . vitamin B-12 (CYANOCOBALAMIN) 100 MCG tablet Take 100 mcg by mouth daily.    . Vitamin D, Cholecalciferol, 1000 UNITS TABS Take by mouth.    . Insulin Glargine (LANTUS SOLOSTAR) 100 UNIT/ML Solostar Pen INJECT 50 UNITS INTO SKIN DAILY AT 10 PM. (Patient not taking: Reported on 09/06/2015) 15 mL 5   No current facility-administered medications on file prior to visit.   Allergies  Allergen Reactions  . Latex Hives    itching   Family History  Problem Relation Age of Onset  . Hypertension Mother   . Diabetes Mother   . Thyroid disease Mother   . Diabetes Father   . Cancer Father     Colon cancer  . Diabetes Sister    PE: BP 122/70 mmHg  Pulse 88  Temp(Src) 97.5 F (36.4 C) (Oral)  Resp 12  Ht 5' 2"  (1.575 m)  Wt 186 lb 9.6 oz (84.641 kg)  BMI 34.12 kg/m2  SpO2 96% Wt Readings from Last 3 Encounters:  09/06/15 186 lb 9.6 oz (84.641 kg)  07/18/15 188 lb 12.8 oz (85.639 kg)  04/16/15 190 lb (86.183 kg)   Constitutional: overweight, in NAD Eyes: PERRLA, EOMI, no exophthalmos ENT: moist mucous membranes, no thyromegaly, no cervical lymphadenopathy Cardiovascular: RRR, No MRG Respiratory: CTA B Gastrointestinal: abdomen soft, NT, ND, BS+ Musculoskeletal: no deformities, strength intact in all 4 Skin: moist, warm, no rashes Neurological: no tremor with outstretched hands, DTR normal in all 4  ASSESSMENT: 1. DM2, insulin-dependent, uncontrolled, with complications - DR  PLAN:  1. Patient with long-standing, uncontrolled diabetes, on oral antidiabetic regimen with Metformin half-max dose (intermittent tx as she has frequent diarrhea and nausea) and a  large dose of rapid acting insulin at bedtime, despite the fact that she was advised multiple times by PCP not to take it like this, and she knows that her low blood sugars at night are caused by this practice. She had to stop her basal insulin because Lantus was not covered by her insurance.  - At this visit, I suggested to restart basal insulin, and we will try [place] which I hope is a covered by her insurance. We'll also try to use a GLP-1 receptor agonist and glipizide before dinner to avoid adding mealtime insulin before each of her meals. - I advised her to let me know about her sugars  in 2-3 weeks and I plan to see her back in 6 weeks  - We also discussed about her diet -  advised her to limit her egg intake - I suggested to:  Patient Instructions  Pease stop regular Metformin and start Metformin ER 500 mg 2x a day.  Stop Humalog and start Basaglar 30 units at bedtime.  Start Glipizide 5 mg before dinner.  Start Victoza 0.6 mg daily in am x 1 week, then increase to 1.2 mg in am x 1 week, then increase to 1.8 mg daily in am.  Let me know about your sugars in 2-3 weeks.  Please return in 1.5 months with your sugar log.   - Strongly advised her to start checking sugars at different times of the day - check 2-3 times a day, rotating checks - given sugar log and advised how to fill it and to bring it at next appt  - given foot care handout and explained the principles  - given instructions for hypoglycemia management "15-15 rule"  - advised for yearly eye exams >>  is up-to-date  - Return to clinic in 1.5 mo with sugar log

## 2015-09-06 NOTE — Telephone Encounter (Signed)
Rx sent 

## 2015-09-07 ENCOUNTER — Telehealth: Payer: Self-pay | Admitting: *Deleted

## 2015-09-07 MED ORDER — LIRAGLUTIDE 18 MG/3ML ~~LOC~~ SOPN: 1.8000 mg | PEN_INJECTOR | Freq: Every day | SUBCUTANEOUS | Status: DC

## 2015-09-07 NOTE — Telephone Encounter (Signed)
For the dosage, pt needs 3 pens a month. Resent rx.

## 2015-09-13 ENCOUNTER — Telehealth: Payer: Self-pay | Admitting: Internal Medicine

## 2015-09-13 DIAGNOSIS — E669 Obesity, unspecified: Secondary | ICD-10-CM

## 2015-09-13 DIAGNOSIS — E1169 Type 2 diabetes mellitus with other specified complication: Secondary | ICD-10-CM

## 2015-09-13 NOTE — Telephone Encounter (Signed)
basaglar needs to be called in to [facility] on [address] and smaller gauge needles please

## 2015-09-14 MED ORDER — BASAGLAR KWIKPEN 100 UNIT/ML ~~LOC~~ SOPN: 30.0000 [IU] | PEN_INJECTOR | Freq: Every day | SUBCUTANEOUS | Status: DC

## 2015-09-14 MED ORDER — PEN NEEDLES 31G X 6 MM MISC: 2.0000 | Freq: Every day | Status: DC

## 2015-09-14 NOTE — Telephone Encounter (Signed)
Pt advised of note below and voiced understanding.  

## 2015-09-14 NOTE — Telephone Encounter (Signed)
I contacted the Kristen [name] advised we have refilled her medication. While on the phone the Kristen [name] verbalized she has been having severe diarrhea since she started the metformin and victoza. Kristen [name] has came off both of these medications and right now her blood sugars are averaging around 345. The Kristen [name] has been taking 30 units of humalog at bed time during this period but is picking up basaglar today and will start 30 units of this every evening. Could you please advise on this message during Dr. Charlean SanfilippoGherghe's absence.  Thanks!

## 2015-09-14 NOTE — Telephone Encounter (Signed)
Please start the basaglar as planned Please hold off on the metformin, as this is the drug probably causing the diarrhea Please resume the victoza

## 2015-09-14 NOTE — Addendum Note (Signed)
Addended by: Ann MakiBAILEY, Edessa Jakubowicz T on: 09/14/2015 08:49 AM   Modules accepted: Orders

## 2015-10-01 ENCOUNTER — Other Ambulatory Visit: Payer: Self-pay | Admitting: Family Medicine

## 2015-10-23 ENCOUNTER — Encounter: Payer: 59 | Admitting: Family Medicine

## 2015-10-23 ENCOUNTER — Ambulatory Visit: Payer: Commercial Managed Care - HMO | Admitting: Internal Medicine

## 2015-10-24 ENCOUNTER — Encounter: Payer: 59 | Admitting: Family Medicine

## 2015-10-30 ENCOUNTER — Other Ambulatory Visit (INDEPENDENT_AMBULATORY_CARE_PROVIDER_SITE_OTHER): Payer: Commercial Managed Care - HMO | Admitting: *Deleted

## 2015-10-30 ENCOUNTER — Ambulatory Visit (INDEPENDENT_AMBULATORY_CARE_PROVIDER_SITE_OTHER): Payer: Commercial Managed Care - HMO | Admitting: Internal Medicine

## 2015-10-30 ENCOUNTER — Encounter: Payer: Self-pay | Admitting: Internal Medicine

## 2015-10-30 VITALS — BP 114/62 | HR 87 | Wt 184.0 lb

## 2015-10-30 DIAGNOSIS — Z794 Long term (current) use of insulin: Secondary | ICD-10-CM

## 2015-10-30 DIAGNOSIS — E11319 Type 2 diabetes mellitus with unspecified diabetic retinopathy without macular edema: Secondary | ICD-10-CM

## 2015-10-30 LAB — POCT GLYCOSYLATED HEMOGLOBIN (HGB A1C): Hemoglobin A1C: 9.3

## 2015-10-30 MED ORDER — LIRAGLUTIDE 18 MG/3ML ~~LOC~~ SOPN: 1.8000 mg | PEN_INJECTOR | Freq: Every day | SUBCUTANEOUS | Status: DC

## 2015-10-30 MED ORDER — GLIPIZIDE 5 MG PO TABS: 10.0000 mg | ORAL_TABLET | Freq: Two times a day (BID) | ORAL | Status: DC

## 2015-10-30 NOTE — Progress Notes (Signed)
Patient ID: Kristen [name], female   DOB: 09-16-1950, 65 y.o.   MRN: [id number]  HPI: Kristen [name] is a 65 y.o.-year-old female, returning for f/u for DM2, dx in 2000, insulin-dependent since 2012, uncontrolled, with complications (DR). Last visit 1.5 mo ago.  Last hemoglobin A1c was: Lab Results  Component Value Date   HGBA1C 8.6 07/18/2015   HGBA1C 9.9 04/16/2015   HGBA1C 9.5 09/21/2014   Pt was on a regimen of: - Metformin 500 mg 2 a day with meals >> diarrhea and nausea - Humalog 40 units at bedtime (!) She is supposed to be on Lantus 50 units at bedtime >> not taking >> not covered by insurance. She used to be on Glyburide  - stopped years.  At last visit, we changed to: - Basaglar 30 units at bedtime. - Glipizide 5 mg before dinner. - Victoza 1.2 mg [name] in am. She added Humalog 30 at bedtime also (!!!) We tried Metformin ER 500 mg >> still N/D/AP.  Pt checks her sugars 2x a day and they are: - am: 80-140 >> 110-130 - 2h after b'fast: n/c - before lunch: n/c - 2h after lunch: n/c - before dinner: n/c >> 128-299 - 2h after dinner: 280-295 >> n/c - bedtime: n/c >>189-273 - nighttime: 65 >> n/c No lows now: Lowest 110 ; she has hypoglycemia awareness at 70s.  Highest sugar was 360 >> 299.  Glucometer: AccuChek  Pt's meals are - smaller meals since 04/2015, cut down eggs since last visit - Breakfast: 2 boiled eggs, 1/2 grapefruit - Lunch: meat + salad + apple or apple sauce - Dinner: meat + veggies - Snacks: 2 - tuna fish + 6 crackers; peanuts  - no CKD, last BUN/creatinine:  Lab Results  Component Value Date   BUN 13 04/16/2015   CREATININE 0.69 04/16/2015  She is on losartan. - last set of lipids: Lab Results  Component Value Date   CHOL 140 04/16/2015   HDL 42* 04/16/2015   LDLCALC 76 04/16/2015   TRIG 111 04/16/2015   CHOLHDL 3.3 04/16/2015   - last eye exam was in 03/2015. + DR. + Glaucoma. - no numbness and tingling in her feet.  She also has a  history of GERD, hypertension.  ROS: Constitutional: No weight loss, no fatigue, no subjective hyperthermia/hypothermia Eyes: no blurry vision, no xerophthalmia ENT: + sore throat, no nodules palpated in throat, no dysphagia/odynophagia, no hoarseness Cardiovascular: no CP/SOB/palpitations/leg swelling Respiratory: no cough/SOB Gastrointestinal: no N/V/D/C/acid reflux Musculoskeletal: no muscle/joint aches Skin: no rashes Neurological: no tremors/numbness/tingling/dizziness Psychiatric: no depression/anxiety  Past Medical History  Diagnosis Date  . HTN (hypertension)   . Meralgia paresthetica   . Glaucoma   . Diabetes mellitus    Past Surgical History  Procedure Laterality Date  . Breast cyst excision    . Tonsillectomy and adenoidectomy    . Cholecystectomy  06/16/2008   Social History   Social History  . Marital Status: Widowed    Spouse Name: N/A  . Number of Children: 1   Occupational History  .    Social History Main Topics  . Smoking status: Never Smoker   . Smokeless tobacco: Not on file  . Alcohol Use: No  . Drug Use: No   Current Outpatient Prescriptions on File Prior to Visit  Medication Sig Dispense Refill  . Ascorbic Acid (VITAMIN C) 1000 MG tablet Take 1,000 mg by mouth [name].    . Blood Glucose Monitoring Suppl (BLOOD GLUCOSE METER KIT AND SUPPLIES)  KIT Test blood sugar as directed. 1 each 0  . Calcium Carbonate-Vit D-Min (CALCIUM 1200 PO) Take by mouth.    Marland Kitchen glipiZIDE (GLUCOTROL) 5 MG tablet Take 1 tablet (5 mg total) by mouth [name] before supper. 30 tablet 2  . glucose blood test strip Use as instructed 100 each 9  . Insulin Glargine (BASAGLAR KWIKPEN) 100 UNIT/ML SOPN Inject 0.3 mLs (30 Units total) into the skin at bedtime. 5 mL 2  . Insulin Pen Needle (PEN NEEDLES) 31G X 6 MM MISC 2 each by Does not apply route [name]. 100 each 11  . Lancets MISC Use as directed. Lancets for meter, which ever insurance prefers 100 each 2  . Liraglutide (VICTOZA) 18  MG/3ML SOPN Inject 0.3 mLs (1.8 mg total) into the skin [name] before breakfast. 9 mL 2  . losartan-hydrochlorothiazide (HYZAAR) 100-25 MG tablet TAKE 1 TABLET BY MOUTH [name]. 30 tablet 0  . magnesium 30 MG tablet Take 30 mg by mouth 2 (two) times [name].    . Melatonin 1 MG CAPS Take by mouth. Reported on 07/18/2015    . Multiple Vitamin (MULTIVITAMIN) tablet Take 1 tablet by mouth [name].    . sucralfate (CARAFATE) 1 G tablet 1 tab 1 hr ac and hs 120 tablet 3  . vitamin B-12 (CYANOCOBALAMIN) 100 MCG tablet Take 100 mcg by mouth [name].    . Vitamin D, Cholecalciferol, 1000 UNITS TABS Take by mouth.    . metFORMIN (GLUCOPHAGE-XR) 500 MG 24 hr tablet Take 1 tablet (500 mg total) by mouth [name] with breakfast. (Patient not taking: Reported on 10/30/2015) 60 tablet 2   No current facility-administered medications on file prior to visit.   Allergies  Allergen Reactions  . Latex Hives    itching   Family History  Problem Relation Age of Onset  . Hypertension Mother   . Diabetes Mother   . Thyroid disease Mother   . Diabetes Father   . Cancer Father     Colon cancer  . Diabetes Sister    PE: BP 114/62 mmHg  Pulse 87  Wt 184 lb (83.462 kg)  SpO2 94% Body mass index is 33.65 kg/(m^2). Wt Readings from Last 3 Encounters:  10/30/15 184 lb (83.462 kg)  09/06/15 186 lb 9.6 oz (84.641 kg)  07/18/15 188 lb 12.8 oz (85.639 kg)   Constitutional: overweight, in NAD Eyes: PERRLA, EOMI, no exophthalmos ENT: moist mucous membranes, no thyromegaly, no cervical lymphadenopathy Cardiovascular: RRR, No MRG Respiratory: CTA B Gastrointestinal: abdomen soft, NT, ND, BS+ Musculoskeletal: no deformities, strength intact in all 4 Skin: moist, warm, no rashes Neurological: no tremor with outstretched hands, DTR normal in all 4  ASSESSMENT: 1. DM2, insulin-dependent, uncontrolled, with complications - DR  PLAN:  1. Patient with long-standing, uncontrolled diabetes, With worse control since last visit.  At last visit, we stopped her large dose of Humalog at bedtime and started Basaglar. We also started glipizide and Victoza and try to switch to metformin extended-release. However, she had nausea, diarrhea and abdominal pain with metformin so she had to stop. She also had stopped Victoza, but has since restarted. As sugars are still uncontrolled, she restarted her Humalog at bedtime. - I again discussed with her about the need to stop the Humalog at bedtime. If we need Humalog in the future, he will need to be before meals. However, for now, I would like to optimize the treatment that she is on right now, by increasing Victoza to maximum dose of 1.8 mg,  and also increasing glipizide to 10 mg twice a day. We'll continue Basaglar at 30 units at bedtime for now. - I suggested to:  Patient Instructions  Please continue: - Basaglar 30 units at bedtime  Increase: - Victoza to 1.8 mg in am, before b'fast - Glipizide to 10 mg 2x a day, before breakfast and before dinner  Stop Humalog.  Please return in 1.5 months with your sugar log.   - continue checking sugars at different times of the day - check 2-3 times a day, rotating checks - advised for yearly eye exams >>  is up-to-date  - HbA1c today was 9.3%, higher - Return to clinic in 1.5 mo with sugar log

## 2015-10-30 NOTE — Patient Instructions (Addendum)
Please continue: - Basaglar 30 units at bedtime  Increase: - Victoza to 1.8 mg in am, before b'fast - Glipizide to 10 mg 2x a day, before breakfast and before dinner  Stop Humalog.  Please return in 1.5 months with your sugar log.

## 2015-11-05 ENCOUNTER — Other Ambulatory Visit: Payer: Self-pay | Admitting: Family Medicine

## 2015-12-04 ENCOUNTER — Encounter: Payer: 59 | Admitting: Family Medicine

## 2015-12-05 ENCOUNTER — Other Ambulatory Visit: Payer: Self-pay | Admitting: Family Medicine

## 2015-12-28 ENCOUNTER — Encounter: Payer: Self-pay | Admitting: Internal Medicine

## 2015-12-28 ENCOUNTER — Ambulatory Visit (INDEPENDENT_AMBULATORY_CARE_PROVIDER_SITE_OTHER): Payer: Commercial Managed Care - HMO | Admitting: Internal Medicine

## 2015-12-28 VITALS — BP 128/90 | HR 84 | Temp 98.0°F | Wt 182.0 lb

## 2015-12-28 DIAGNOSIS — E11319 Type 2 diabetes mellitus with unspecified diabetic retinopathy without macular edema: Secondary | ICD-10-CM

## 2015-12-28 DIAGNOSIS — Z794 Long term (current) use of insulin: Secondary | ICD-10-CM | POA: Diagnosis not present

## 2015-12-28 MED ORDER — BASAGLAR KWIKPEN 100 UNIT/ML ~~LOC~~ SOPN: 40.0000 [IU] | PEN_INJECTOR | Freq: Every day | SUBCUTANEOUS | Status: DC

## 2015-12-28 NOTE — Progress Notes (Signed)
Pre visit review using our clinic review tool, if applicable. No additional management support is needed unless otherwise documented below in the visit note. 

## 2015-12-28 NOTE — Patient Instructions (Signed)
Please continue: - Basaglar 40 units at bedtime - Victoza 1.8 mg in am, before b'fast - Glipizide 10 mg 2x a day, before breakfast and before dinner  Please return in 1.5 months with your sugar log.

## 2015-12-28 NOTE — Progress Notes (Signed)
Patient ID: Kristen [name], female   DOB: [birth year], 65 y.o.   MRN: [id number]  HPI: Kristen [name] is a 65 y.o.-year-old female, returning for f/u for DM2, dx in 2000, insulin-dependent since 2012, uncontrolled, with complications (DR). Last visit 2 mo ago.  Last hemoglobin A1c was: Lab Results  Component Value Date   HGBA1C 9.3 10/30/2015   HGBA1C 8.6 07/18/2015   HGBA1C 9.9 04/16/2015   Pt was on a regimen of: - Metformin 500 mg 2 a day with meals >> diarrhea and nausea - Humalog 40 units at bedtime (!) She is supposed to be on Lantus 50 units at bedtime >> not taking >> not covered by insurance. She used to be on Glyburide  - stopped years.  At last visit, we changed to: - Basaglar 40 units at bedtime - Victoza 1.8 mg in am, before b'fast - Glipizide 10 mg 2x a day, before breakfast and before dinner She tried Metformin ER 500 mg >> still N/D/AP.  Pt checks her sugars 2x a day and they are better, but still higher as the day goes by: - am: 80-140 >> 110-130 >> 124-141 - 2h after b'fast: n/c - before lunch: n/c - 2h after lunch: n/c - before dinner: n/c >> 128-299 >> 165-179  - 2h after dinner: 280-295 >> n/c (snack after dinner: fruit, icecream) - bedtime: n/c >>189-273 >> 179-195, 265 - nighttime: 65 >> n/c No lows now: Lowest 110 >> 124; she has hypoglycemia awareness at 70s.  Highest sugar was 360 >> 299 >> 265.  Glucometer: AccuChek  Pt's meals are - smaller meals since 04/2015, cut down eggs: - Breakfast: 2 boiled eggs, 1/2 grapefruit - PB + Jelly snack at 11 am - Lunch: meat + salad + apple or apple sauce - Dinner: meat + veggies - Snacks: 2 - tuna fish + 6 crackers; peanuts  She stopped exercising lately as her children moved back to her house. She joined a gym >> will start exercise.  - no CKD, last BUN/creatinine:  Lab Results  Component Value Date   BUN 13 04/16/2015   CREATININE 0.69 04/16/2015  She is on losartan. - last set of lipids: Lab Results   Component Value Date   CHOL 140 04/16/2015   HDL 42* 04/16/2015   LDLCALC 76 04/16/2015   TRIG 111 04/16/2015   CHOLHDL 3.3 04/16/2015   - last eye exam was in 03/2015. + DR. + Glaucoma. - no numbness and tingling in her feet.  She also has a history of GERD, hypertension.  ROS: Constitutional: No weight loss, no fatigue, no subjective hyperthermia/hypothermia Eyes: no blurry vision, no xerophthalmia ENT: no sore throat, no nodules palpated in throat, no dysphagia/odynophagia, no hoarseness Cardiovascular: no CP/SOB/palpitations/leg swelling Respiratory: no cough/SOB Gastrointestinal: no N/V/D/C/acid reflux Musculoskeletal: no muscle/joint aches Skin: no rashes Neurological: no tremors/numbness/tingling/dizziness   I reviewed pt's medications, allergies, PMH, social hx, family hx, and changes were documented in the history of present illness. Otherwise, unchanged from my initial visit note.  Past Medical History  Diagnosis Date  . HTN (hypertension)   . Meralgia paresthetica   . Glaucoma   . Diabetes mellitus    Past Surgical History  Procedure Laterality Date  . Breast cyst excision    . Tonsillectomy and adenoidectomy    . Cholecystectomy  06/16/2008   Social History   Social History  . Marital Status: Widowed    Spouse Name: N/A  . Number of Children: 1   Occupational History  .  Social History Main Topics  . Smoking status: Never Smoker   . Smokeless tobacco: Not on file  . Alcohol Use: No  . Drug Use: No   Current Outpatient Prescriptions on File Prior to Visit  Medication Sig Dispense Refill  . Ascorbic Acid (VITAMIN C) 1000 MG tablet Take 1,000 mg by mouth daily.    . Blood Glucose Monitoring Suppl (BLOOD GLUCOSE METER KIT AND SUPPLIES) KIT Test blood sugar as directed. 1 each 0  . Calcium Carbonate-Vit D-Min (CALCIUM 1200 PO) Take by mouth.    Marland Kitchen glipiZIDE (GLUCOTROL) 5 MG tablet Take 2 tablets (10 mg total) by mouth 2 (two) times daily before a  meal. 120 tablet 2  . glucose blood test strip Use as instructed 100 each 9  . Insulin Glargine (BASAGLAR KWIKPEN) 100 UNIT/ML SOPN Inject 0.3 mLs (30 Units total) into the skin at bedtime. 5 mL 2  . Insulin Pen Needle (PEN NEEDLES) 31G X 6 MM MISC 2 each by Does not apply route daily. 100 each 11  . Lancets MISC Use as directed. Lancets for meter, which ever insurance prefers 100 each 2  . Liraglutide (VICTOZA) 18 MG/3ML SOPN Inject 0.3 mLs (1.8 mg total) into the skin daily before breakfast. 9 mL 2  . losartan-hydrochlorothiazide (HYZAAR) 100-25 MG tablet TAKE 1 TABLET BY MOUTH DAILY. 30 tablet 0  . magnesium 30 MG tablet Take 30 mg by mouth 2 (two) times daily.    . Melatonin 1 MG CAPS Take by mouth. 65    . Multiple Vitamin (MULTIVITAMIN) tablet Take 1 tablet by mouth daily.    . sucralfate (CARAFATE) 1 G tablet 1 tab 1 hr ac and hs 120 tablet 3  . vitamin B-12 (CYANOCOBALAMIN) 100 MCG tablet Take 100 mcg by mouth daily.    . Vitamin D, Cholecalciferol, 1000 UNITS TABS Take by mouth.     No current facility-administered medications on file prior to visit.   Allergies  Allergen Reactions  . Latex Hives    itching   Family History  Problem Relation Age of Onset  . Hypertension Mother   . Diabetes Mother   . Thyroid disease Mother   . Diabetes Father   . Cancer Father     Colon cancer  . Diabetes Sister    PE: BP 128/90 mmHg  Pulse 84  Temp(Src) 98 F (36.7 C) (Oral)  Wt 182 lb (82.555 kg) Body mass index is 33.28 kg/(m^2). Wt Readings from Last 3 Encounters:  12/28/15 182 lb (82.555 kg)  10/30/15 184 lb (83.462 kg)  09/06/15 186 lb 9.6 oz (84.641 kg)   Constitutional: overweight, in NAD Eyes: PERRLA, EOMI, no exophthalmos ENT: moist mucous membranes, no thyromegaly, no cervical lymphadenopathy Cardiovascular: RRR, No MRG Respiratory: CTA B Gastrointestinal: abdomen soft, NT, ND, BS+ Musculoskeletal: no deformities, strength intact in all 4 Skin:  moist, warm, no rashes Neurological: no tremor with outstretched hands, DTR normal in all 4  ASSESSMENT: 1. DM2, insulin-dependent, uncontrolled, with complications - DR  PLAN:  1. Patient with long-standing, uncontrolled diabetes, With better control since last visit. She had nausea, diarrhea and abdominal pain with metformin so she had to stop >> now on Victoza, Basaglar, and Glipizide. Sugars are better, but they still increase in a stepwise fashion throughout the day. She joined a gym and plans to restart exercise >> will give her 1.5 mo and reevaluate at next visit >> may need an SGLT2 inh vs. Mealtime insulin. - reviewed last HbA1c >>  9.3% - I suggested to:  Patient Instructions  Please continue: - Basaglar 40 units at bedtime - Victoza 1.8 mg in am, before b'fast - Glipizide 10 mg 2x a day, before breakfast and before dinner  Please return in 1.5 months with your sugar log.   - continue checking sugars at different times of the day - check 2-3 times a day, rotating checks - advised for yearly eye exams >>  is up-to-date  - Return to clinic in 1.5 mo with sugar log

## 2016-01-10 ENCOUNTER — Other Ambulatory Visit: Payer: Self-pay | Admitting: Family Medicine

## 2016-01-11 ENCOUNTER — Telehealth: Payer: Self-pay

## 2016-01-11 MED ORDER — SUCRALFATE 1 G PO TABS: ORAL_TABLET | ORAL | 0 refills | Status: DC

## 2016-01-11 NOTE — Telephone Encounter (Signed)
Meds ordered this encounter  Medications  . sucralfate (CARAFATE) 1 g tablet    Sig: 1 tab 1 hr ac and hs    Dispense:  120 tablet    Refill:  0    Order Specific Question:   Supervising Provider    Answer:   Clelia Croft, EVA N [[id number]]

## 2016-01-11 NOTE — Telephone Encounter (Signed)
Needs a refill on sucralfate (CARAFATE) 1 G tablet.   Has appointment with Dr. Clelia Croft on 08/02 @ 11:00, but already out of medication and is having stomach issues.  Pharmacy: [id number] - [place], [place] - [address]  Pt # [phone]

## 2016-01-11 NOTE — Telephone Encounter (Signed)
She set up an appt on August 2. SHe is not in a lot of pain but just discomfort and thinks the medication will help.

## 2016-01-11 NOTE — Telephone Encounter (Signed)
If she is having abdominal pain it's best she come in for an evaluation.

## 2016-01-11 NOTE — Telephone Encounter (Signed)
Pt has been seen recently for check ups, but carafate has not been Rxd since 2015 with some RFs. The only mention I see recently is about stomach upset with metformin. Can we get her 1 RF of carafate since she has appt sch for 8/2?

## 2016-01-11 NOTE — Telephone Encounter (Signed)
Patient is out of medication - sucralfate (CARAFATE) 1 G tablet  Has an appointment with Dr. Clelia Croft on 08/2 @ 11 am, but stomach is hurting.   Pharmacy: [id number] - [place], [place] - [address]  Pt # [phone]

## 2016-01-16 ENCOUNTER — Encounter: Payer: Self-pay | Admitting: Family Medicine

## 2016-01-16 ENCOUNTER — Ambulatory Visit: Payer: 59 | Admitting: Family Medicine

## 2016-01-16 ENCOUNTER — Ambulatory Visit (INDEPENDENT_AMBULATORY_CARE_PROVIDER_SITE_OTHER): Payer: Commercial Managed Care - HMO | Admitting: Family Medicine

## 2016-01-16 VITALS — BP 110/76 | HR 76 | Temp 98.0°F | Resp 16 | Ht 62.0 in | Wt 182.4 lb

## 2016-01-16 DIAGNOSIS — E11319 Type 2 diabetes mellitus with unspecified diabetic retinopathy without macular edema: Secondary | ICD-10-CM

## 2016-01-16 DIAGNOSIS — Z794 Long term (current) use of insulin: Secondary | ICD-10-CM

## 2016-01-16 DIAGNOSIS — I10 Essential (primary) hypertension: Secondary | ICD-10-CM | POA: Diagnosis not present

## 2016-01-16 DIAGNOSIS — K2970 Gastritis, unspecified, without bleeding: Secondary | ICD-10-CM | POA: Diagnosis not present

## 2016-01-16 DIAGNOSIS — Z1321 Encounter for screening for nutritional disorder: Secondary | ICD-10-CM | POA: Diagnosis not present

## 2016-01-16 DIAGNOSIS — E559 Vitamin D deficiency, unspecified: Secondary | ICD-10-CM | POA: Diagnosis not present

## 2016-01-16 DIAGNOSIS — K2990 Gastroduodenitis, unspecified, without bleeding: Secondary | ICD-10-CM

## 2016-01-16 LAB — LIPID PANEL
Cholesterol: 160 mg/dL (ref 125–200)
HDL: 52 mg/dL (ref >=46)
LDL Cholesterol: 80 mg/dL (ref <130)
Total CHOL/HDL Ratio: 3.1 Ratio (ref <=5.0)
Triglycerides: 138 mg/dL (ref <150)
VLDL: 28 mg/dL (ref <30)

## 2016-01-16 LAB — COMPREHENSIVE METABOLIC PANEL
ALT: 25 U/L (ref 6–29)
AST: 19 U/L (ref 10–35)
Albumin: 4.1 g/dL (ref 3.6–5.1)
Alkaline Phosphatase: 86 U/L (ref 33–130)
BUN: 15 mg/dL (ref 7–25)
CO2: 26 mmol/L (ref 20–31)
Calcium: 9.4 mg/dL (ref 8.6–10.4)
Chloride: 100 mmol/L (ref 98–110)
Creat: 0.66 mg/dL (ref 0.50–0.99)
Glucose, Bld: 152 mg/dL — ABNORMAL HIGH (ref 65–99)
Potassium: 4.1 mmol/L (ref 3.5–5.3)
Sodium: 138 mmol/L (ref 135–146)
Total Bilirubin: 0.4 mg/dL (ref 0.2–1.2)
Total Protein: 6.8 g/dL (ref 6.1–8.1)

## 2016-01-16 LAB — POCT CBC
Granulocyte percent: 51.5 %G (ref 37–80)
HCT, POC: 40.8 % (ref 37.7–47.9)
Hemoglobin: 14.4 g/dL (ref 12.2–16.2)
Lymph, poc: 2.8 (ref 0.6–3.4)
MCH, POC: 30.7 pg (ref 27–31.2)
MCHC: 35.2 g/dL (ref 31.8–35.4)
MCV: 87.3 fL (ref 80–97)
MID (cbc): 0.5 (ref 0–0.9)
MPV: 8.2 fL (ref 0–99.8)
POC Granulocyte: 3.5 (ref 2–6.9)
POC LYMPH PERCENT: 41 %L (ref 10–50)
POC MID %: 7.5 %M (ref 0–12)
Platelet Count, POC: 161 10*3/uL (ref 142–424)
RBC: 4.67 M/uL (ref 4.04–5.48)
RDW, POC: 13.3 %
WBC: 6.8 10*3/uL (ref 4.6–10.2)

## 2016-01-16 MED ORDER — LOSARTAN POTASSIUM-HCTZ 100-25 MG PO TABS: 1.0000 | ORAL_TABLET | Freq: Every day | ORAL | 3 refills | Status: DC

## 2016-01-16 NOTE — Progress Notes (Signed)
Subjective:    Patient ID: Kristen [name], female    DOB: [birth year], 65 y.o.   MRN: [id number] Chief Complaint  Patient presents with  . Follow-up    Diabetes  . Labs Only  . Medication Refill    Carafate    HPI  Kristen [name] is a 65 yo woman here today to follow-up on her chronic medical conditions.  DMII:  Followed by Dr. Cruzita Lederer who she saw 2 wks prior. Has been insulin dependent since 2012 and uncontrolled most of that time. Is not on cholesterol medicine and is fasting today. HTN: Monitors at home -130/85  Abdominal pain: Has been using carafate - metformin does cause stomach upset chronically. Using tums otc but nothing else.  Feels like a stomach ulcer has flared up - it is not painful but "just feels uneasy" in the epigastric. The pain gets worse after eating raw veggies or greasy/fatty foods.  Does get GERD but does not need medication.  Spicy food does not bother her.  She takes carafate qid for abiut a month to treat a flair. Happens about twice a year.  No hematochezia or melana, normal stools.  Has not had a problem tolerating ppis prior.  Taking magnesium, vit D, calcium, and vit B12. Does get some calcium in her diet, drinks milk some but not to much since caused weight gain.  Past Medical History:  Diagnosis Date  . Diabetes mellitus   . Glaucoma   . HTN (hypertension)   . Meralgia paresthetica    Past Surgical History:  Procedure Laterality Date  . BREAST CYST EXCISION    . CHOLECYSTECTOMY  06/16/2008  . TONSILLECTOMY AND ADENOIDECTOMY     Current Outpatient Prescriptions on File Prior to Visit  Medication Sig Dispense Refill  . Ascorbic Acid (VITAMIN C) 1000 MG tablet Take 1,000 mg by mouth daily.    . Blood Glucose Monitoring Suppl (BLOOD GLUCOSE METER KIT AND SUPPLIES) KIT Test blood sugar as directed. 1 each 0  . Calcium Carbonate-Vit D-Min (CALCIUM 1200 PO) Take by mouth.    Marland Kitchen glipiZIDE (GLUCOTROL) 5 MG tablet Take 2 tablets (10 mg total) by mouth 2 (two)  times daily before a meal. 120 tablet 2  . glucose blood test strip Use as instructed 100 each 9  . Insulin Glargine (BASAGLAR KWIKPEN) 100 UNIT/ML SOPN Inject 0.4 mLs (40 Units total) into the skin at bedtime. 5 mL 2  . Insulin Pen Needle (PEN NEEDLES) 31G X 6 MM MISC 2 each by Does not apply route daily. 100 each 11  . Lancets MISC Use as directed. Lancets for meter, which ever insurance prefers 100 each 2  . Liraglutide (VICTOZA) 18 MG/3ML SOPN Inject 0.3 mLs (1.8 mg total) into the skin daily before breakfast. 9 mL 2  . magnesium 30 MG tablet Take 30 mg by mouth 2 (two) times daily.    . Multiple Vitamin (MULTIVITAMIN) tablet Take 1 tablet by mouth daily.    . sucralfate (CARAFATE) 1 g tablet 1 tab 1 hr ac and hs 120 tablet 0  . vitamin B-12 (CYANOCOBALAMIN) 100 MCG tablet Take 100 mcg by mouth daily.    . Vitamin D, Cholecalciferol, 1000 UNITS TABS Take by mouth.     No current facility-administered medications on file prior to visit.    Allergies  Allergen Reactions  . Latex Hives    itching   Family History  Problem Relation Age of Onset  . Hypertension Mother   . Diabetes Mother   .  Thyroid disease Mother   . Diabetes Father   . Cancer Father     Colon cancer  . Diabetes Sister    Social History   Social History  . Marital status: Widowed    Spouse name: N/A  . Number of children: N/A  . Years of education: N/A   Social History Main Topics  . Smoking status: Never Smoker  . Smokeless tobacco: None  . Alcohol use No  . Drug use: No  . Sexual activity: Yes    Birth control/ protection: None     Comment: number of sex partners in the last 12 months  0   Other Topics Concern  . None   Social History Narrative  . None    Review of Systems  Constitutional: Negative for appetite change, chills, diaphoresis and fever.  Eyes: Negative for visual disturbance.  Respiratory: Negative for cough and shortness of breath.   Cardiovascular: Negative for chest pain,  palpitations and leg swelling.  Gastrointestinal: Positive for abdominal pain. Negative for abdominal distention, constipation, diarrhea, nausea and vomiting.  Genitourinary: Negative for decreased urine volume.  Neurological: Negative for syncope, numbness and headaches.  Hematological: Does not bruise/bleed easily.       Objective:   Physical Exam  Constitutional: She is oriented to person, place, and time. She appears well-developed and well-nourished. No distress.  HENT:  Head: Normocephalic and atraumatic.  Right Ear: External ear normal.  Left Ear: External ear normal.  Eyes: Conjunctivae are normal. No scleral icterus.  Neck: Normal range of motion. Neck supple. No thyromegaly present.  Cardiovascular: Normal rate, regular rhythm, normal heart sounds and intact distal pulses.   Pulmonary/Chest: Effort normal and breath sounds normal. No respiratory distress.  Musculoskeletal: She exhibits no edema.  Lymphadenopathy:    She has no cervical adenopathy.  Neurological: She is alert and oriented to person, place, and time.  Skin: Skin is warm and dry. She is not diaphoretic. No erythema.  Psychiatric: She has a normal mood and affect. Her behavior is normal.      BP 110/76   Pulse 76   Temp 98 F (36.7 C) (Oral)   Resp 16   Ht 5' 2"  (1.575 m)   Wt 182 lb 6.4 oz (82.7 kg)   SpO2 97%   BMI 33.36 kg/m      Assessment & Plan:    1. Type 2 diabetes mellitus with retinopathy, with long-term current use of insulin, macular edema presence unspecified, unspecified laterality, unspecified retinopathy severity ([place]) - follows closely with Dr. Mikle Bosworth. Pt requests to have routine fasting labs done today;.  2. Essential hypertension - refilled losartan-hctz 100-25  3. Gastritis and gastroduodenitis - Ok to refill carafate as needed.  Needs rarely and a short course is able to eliminate acute sxs often only 2-3x/yr  4. Encounter for vitamin deficiency screening   5.      Vit D def  - high dose rx replacement x 6 mos, then start otc 2000u/d (as currently low on 1000u/d)  Orders Placed This Encounter  Procedures  . Lipid panel    Order Specific Question:   Has the patient fasted?    Answer:   Yes  . Comprehensive metabolic panel    Order Specific Question:   Has the patient fasted?    Answer:   Yes  . H. pylori breath test  . VITAMIN D 25 Hydroxy (Vit-D Deficiency, Fractures)  . POCT CBC    Meds ordered this encounter  Medications  . losartan-hydrochlorothiazide (HYZAAR) 100-25 MG tablet    Sig: Take 1 tablet by mouth daily.    Dispense:  90 tablet    Refill:  3     Delman Cheadle, M.D.  [facility] [address] [place], [place] [address] [phone] phone [phone] fax  01/20/16 12:10 PM  Results for orders placed or performed in visit on 01/16/16  Lipid panel  Result Value Ref Range   Cholesterol 160 125 - 200 mg/dL   Triglycerides 138 <150 mg/dL   HDL 52 >=46 mg/dL   Total CHOL/HDL Ratio 3.1 <=5.0 Ratio   VLDL 28 <30 mg/dL   LDL Cholesterol 80 <130 mg/dL  Comprehensive metabolic panel  Result Value Ref Range   Sodium 138 135 - 146 mmol/L   Potassium 4.1 3.5 - 5.3 mmol/L   Chloride 100 98 - 110 mmol/L   CO2 26 20 - 31 mmol/L   Glucose, Bld 152 (H) 65 - 99 mg/dL   BUN 15 7 - 25 mg/dL   Creat 0.66 0.50 - 0.99 mg/dL   Total Bilirubin 0.4 0.2 - 1.2 mg/dL   Alkaline Phosphatase 86 33 - 130 U/L   AST 19 10 - 35 U/L   ALT 25 6 - 29 U/L   Total Protein 6.8 6.1 - 8.1 g/dL   Albumin 4.1 3.6 - 5.1 g/dL   Calcium 9.4 8.6 - 10.4 mg/dL  H. pylori breath test  Result Value Ref Range   H. pylori Breath Test NOT DETECTED Not Detected  VITAMIN D 25 Hydroxy (Vit-D Deficiency, Fractures)  Result Value Ref Range   Vit D, 25-Hydroxy 23 (L) 30 - 100 ng/mL  POCT CBC  Result Value Ref Range   WBC 6.8 4.6 - 10.2 K/uL   Lymph, poc 2.8 0.6 - 3.4   POC LYMPH PERCENT 41.0 10 - 50 %L   MID (cbc) 0.5 0 - 0.9   POC MID % 7.5  0 - 12 %M   POC Granulocyte 3.5 2 - 6.9   Granulocyte percent 51.5 37 - 80 %G   RBC 4.67 4.04 - 5.48 M/uL   Hemoglobin 14.4 12.2 - 16.2 g/dL   HCT, POC 40.8 37.7 - 47.9 %   MCV 87.3 80 - 97 fL   MCH, POC 30.7 27 - 31.2 pg   MCHC 35.2 31.8 - 35.4 g/dL   RDW, POC 13.3 %   Platelet Count, POC 161 142 - 424 K/uL   MPV 8.2 0 - 99.8 fL

## 2016-01-16 NOTE — Telephone Encounter (Signed)
Pt advised.

## 2016-01-16 NOTE — Patient Instructions (Signed)
     IF you received an x-ray today, you will receive an invoice from [facility]. Please contact [facility] at [phone] with questions or concerns regarding your invoice.   IF you received labwork today, you will receive an invoice from [facility]. Please contact [facility] at [phone] with questions or concerns regarding your invoice.   Our billing staff will not be able to assist you with questions regarding bills from these companies.  You will be contacted with the lab results as soon as they are available. The fastest way to get your results is to activate your My Chart account. Instructions are located on the last page of this paperwork. If you have not heard from us regarding the results in 2 weeks, please contact this office.      

## 2016-01-17 LAB — H. PYLORI BREATH TEST: H. pylori Breath Test: NOT DETECTED

## 2016-01-17 LAB — VITAMIN D 25 HYDROXY (VIT D DEFICIENCY, FRACTURES): Vit D, 25-Hydroxy: 23 ng/mL — ABNORMAL LOW (ref 30–100)

## 2016-01-20 ENCOUNTER — Encounter: Payer: Self-pay | Admitting: Family Medicine

## 2016-01-20 DIAGNOSIS — E559 Vitamin D deficiency, unspecified: Secondary | ICD-10-CM | POA: Insufficient documentation

## 2016-01-20 MED ORDER — ERGOCALCIFEROL 1.25 MG (50000 UT) PO CAPS: 50000.0000 [IU] | ORAL_CAPSULE | ORAL | 1 refills | Status: DC

## 2016-01-22 ENCOUNTER — Other Ambulatory Visit: Payer: Self-pay | Admitting: Internal Medicine

## 2016-02-09 ENCOUNTER — Other Ambulatory Visit: Payer: Self-pay | Admitting: Internal Medicine

## 2016-02-09 ENCOUNTER — Other Ambulatory Visit: Payer: Self-pay | Admitting: Physician Assistant

## 2016-02-09 NOTE — Telephone Encounter (Signed)
Spoke with patient she does not need a refill at this time.  Does not use medication daily

## 2016-02-14 ENCOUNTER — Ambulatory Visit: Payer: Commercial Managed Care - HMO | Admitting: Internal Medicine

## 2016-02-21 ENCOUNTER — Other Ambulatory Visit: Payer: Self-pay | Admitting: Internal Medicine

## 2016-04-09 ENCOUNTER — Encounter: Payer: Self-pay | Admitting: Internal Medicine

## 2016-04-09 ENCOUNTER — Ambulatory Visit (INDEPENDENT_AMBULATORY_CARE_PROVIDER_SITE_OTHER): Payer: Commercial Managed Care - HMO | Admitting: Internal Medicine

## 2016-04-09 VITALS — BP 140/84 | HR 82 | Ht 61.0 in | Wt 179.0 lb

## 2016-04-09 DIAGNOSIS — Z794 Long term (current) use of insulin: Secondary | ICD-10-CM

## 2016-04-09 DIAGNOSIS — E11319 Type 2 diabetes mellitus with unspecified diabetic retinopathy without macular edema: Secondary | ICD-10-CM

## 2016-04-09 LAB — POCT GLYCOSYLATED HEMOGLOBIN (HGB A1C): Hemoglobin A1C: 9.3

## 2016-04-09 MED ORDER — GLIPIZIDE 5 MG PO TABS: 10.0000 mg | ORAL_TABLET | Freq: Two times a day (BID) | ORAL | 1 refills | Status: DC

## 2016-04-09 MED ORDER — EMPAGLIFLOZIN 25 MG PO TABS: 25.0000 mg | ORAL_TABLET | Freq: Every day | ORAL | 2 refills | Status: DC

## 2016-04-09 NOTE — Patient Instructions (Addendum)
Please continue: - Basaglar 40 units at bedtime - Victoza 1.8 mg in am, before b'fast - Glipizide 10 mg 2x a day, before breakfast and before dinner  Please add Jardiance 25 mg daily before b'fast.  Please return in 1.5 months with your sugar log.

## 2016-04-09 NOTE — Progress Notes (Signed)
Patient ID: Kristen [name], female   DOB: 1950/09/20, 65 y.o.   MRN: [id number]  HPI: Kristen [name] is a 65 y.o.-year-old female, returning for f/u for DM2, dx in 2000, insulin-dependent since 2012, uncontrolled, with complications (DR). Last visit 3 mo ago.  Her mother died last week (35 y/o).  Last hemoglobin A1c was: Lab Results  Component Value Date   HGBA1C 9.3 10/30/2015   HGBA1C 8.6 07/18/2015   HGBA1C 9.9 04/16/2015   Pt was on a regimen of: - Metformin 500 mg 2 a day with meals >> diarrhea and nausea - Humalog 40 units at bedtime (!) She is supposed to be on Lantus 50 units at bedtime >> not taking >> not covered by insurance. She used to be on Glyburide  - stopped years ago.  She is now on: - Basaglar 40 units at bedtime - Victoza 1.8 mg in am, before b'fast - Glipizide 10 mg 2x a day, before breakfast and before dinner She tried Metformin ER 500 mg >> still N/D/AP.  Pt checks her sugars 2x a day and they are better, but still higher as the day goes by: - am: 80-140 >> 110-130 >> 124-141 >> 138-159 - 2h after b'fast: n/c - before lunch: n/c - 2h after lunch: n/c - before dinner: n/c >> 128-299 >> 165-179 >> 175-260 - 2h after dinner: 280-295 >> n/c (snack after dinner: fruit, icecream) - bedtime: n/c >>189-273 >> 179-195, 265 >> 160s - nighttime: 65 >> n/c No lows now: Lowest 110 >> 124 >> 138; she has hypoglycemia awareness at 70s.  Highest sugar was 360 >> 299 >> 265 >> 295.  Glucometer: AccuChek  Pt's meals are - smaller meals since 04/2015: - Breakfast: yoghurt (stopped eggs); oatmeal + walnuts + raisins - PB  - Lunch: meat + salad + apple or apple sauce - Dinner: meat + veggies - Snacks: 2 - tuna fish + 6 crackers; peanuts  Started back walking - 1 mile every other day.   - no CKD, last BUN/creatinine:  Lab Results  Component Value Date   BUN 15 01/16/2016   CREATININE 0.66 01/16/2016  She is on losartan. - last set of lipids: Lab Results  Component  Value Date   CHOL 160 01/16/2016   HDL 52 01/16/2016   LDLCALC 80 01/16/2016   TRIG 138 01/16/2016   CHOLHDL 3.1 01/16/2016   - last eye exam was in 03/2015. + DR. + Glaucoma. - no numbness and tingling in her feet.  She also has a history of GERD, hypertension.  ROS: Constitutional: No weight loss, no fatigue, no subjective hyperthermia/hypothermia Eyes: no blurry vision, no xerophthalmia ENT: no sore throat, no nodules palpated in throat, no dysphagia/odynophagia, no hoarseness Cardiovascular: no CP/SOB/palpitations/leg swelling Respiratory: no cough/SOB Gastrointestinal: no N/V/D/C/acid reflux Musculoskeletal: no muscle/joint aches Skin: no rashes Neurological: no tremors/numbness/tingling/dizziness   I reviewed pt's medications, allergies, PMH, social hx, family hx, and changes were documented in the history of present illness. Otherwise, unchanged from my initial visit note.  Past Medical History:  Diagnosis Date  . Diabetes mellitus   . Glaucoma   . HTN (hypertension)   . Meralgia paresthetica    Past Surgical History:  Procedure Laterality Date  . BREAST CYST EXCISION    . CHOLECYSTECTOMY  06/16/2008  . TONSILLECTOMY AND ADENOIDECTOMY     Social History   Social History  . Marital Status: Widowed    Spouse Name: N/A  . Number of Children: 1   Occupational History  .  Social History Main Topics  . Smoking status: Never Smoker   . Smokeless tobacco: Not on file  . Alcohol Use: No  . Drug Use: No   Current Outpatient Prescriptions on File Prior to Visit  Medication Sig Dispense Refill  . Ascorbic Acid (VITAMIN C) 1000 MG tablet Take 1,000 mg by mouth daily.    . Blood Glucose Monitoring Suppl (BLOOD GLUCOSE METER KIT AND SUPPLIES) KIT Test blood sugar as directed. 1 each 0  . Calcium Carbonate-Vit D-Min (CALCIUM 1200 PO) Take by mouth.    . ergocalciferol (VITAMIN D2) 50000 units capsule Take 1 capsule (50,000 Units total) by mouth once a week. 12  capsule 1  . glipiZIDE (GLUCOTROL) 5 MG tablet TAKE 2 TABLETS (10 MG TOTAL) BY MOUTH 2 (TWO) TIMES DAILY BEFORE A MEAL. 120 tablet 1  . glucose blood test strip Use as instructed 100 each 9  . Insulin Glargine (BASAGLAR KWIKPEN) 100 UNIT/ML SOPN Inject 0.4 mLs (40 Units total) into the skin at bedtime. 5 mL 2  . Insulin Glargine (BASAGLAR KWIKPEN) 100 UNIT/ML SOPN Inject 0.4 mLs (40 Units total) into the skin at bedtime. 15 pen 2  . Insulin Pen Needle (PEN NEEDLES) 31G X 6 MM MISC 2 each by Does not apply route daily. 100 each 11  . Lancets MISC Use as directed. Lancets for meter, which ever insurance prefers 100 each 2  . Liraglutide (VICTOZA) 18 MG/3ML SOPN Inject 0.3 mLs (1.8 mg total) into the skin daily before breakfast. 9 mL 2  . losartan-hydrochlorothiazide (HYZAAR) 100-25 MG tablet Take 1 tablet by mouth daily. 90 tablet 3  . magnesium 30 MG tablet Take 30 mg by mouth 2 (two) times daily.    . Multiple Vitamin (MULTIVITAMIN) tablet Take 1 tablet by mouth daily.    . sucralfate (CARAFATE) 1 g tablet 1 tab 1 hr ac and hs 120 tablet 0  . vitamin B-12 (CYANOCOBALAMIN) 100 MCG tablet Take 100 mcg by mouth daily.    . Vitamin D, Cholecalciferol, 1000 UNITS TABS Take by mouth.     No current facility-administered medications on file prior to visit.    Allergies  Allergen Reactions  . Latex Hives    itching   Family History  Problem Relation Age of Onset  . Hypertension Mother   . Diabetes Mother   . Thyroid disease Mother   . Diabetes Father   . Cancer Father     Colon cancer  . Diabetes Sister    PE: BP 140/84 (BP Location: Left Arm, Patient Position: Sitting)   Pulse 82   Ht _0  (1.549 m)   Wt 179 lb (81.2 kg)   SpO2 97%   BMI 33.82 kg/m  Body mass index is 33.82 kg/m. Wt Readings from Last 3 Encounters:  04/09/16 179 lb (81.2 kg)  01/16/16 182 lb 6.4 oz (82.7 kg)  12/28/15 182 lb (82.6 kg)   Constitutional: overweight, in NAD Eyes: PERRLA, EOMI, no  exophthalmos ENT: moist mucous membranes, no thyromegaly, no cervical lymphadenopathy Cardiovascular: RRR, No MRG Respiratory: CTA B Gastrointestinal: abdomen soft, NT, ND, BS+ Musculoskeletal: no deformities, strength intact in all 4 Skin: moist, warm, no rashes Neurological: no tremor with outstretched hands, DTR normal in all 4  ASSESSMENT: 1. DM2, insulin-dependent, uncontrolled, with complications - DR  PLAN:  1. Patient with long-standing, uncontrolled diabetes, with better control since last visit. She had nausea, diarrhea and abdominal pain with metformin so she had to stop >> now on Victoza,  Basaglar, and Glipizide.  - Sugars are better, but they still increase in a stepwise fashion throughout the day >> Will need an SGLT2 inh and may need mealtime insulin at next visit if sugars still high - advised to increase exercise - reviewed last HbA1c >> 9.3% (higher) - I suggested to:  Patient Instructions  Please continue: - Basaglar 40 units at bedtime - Victoza 1.8 mg in am, before b'fast - Glipizide 10 mg 2x a day, before breakfast and before dinner  Please add Jardiance 25 mg daily before b'fast.  Please return in 1.5 months with your sugar log.   - continue checking sugars at different times of the day - check 2-3 times a day, rotating checks - advised for yearly eye exams >> needs one - refuses flu shot - checked HbA1c today >> 9.3% (stable) - Return to clinic in 1.5 mo with sugar log   Philemon Kingdom, MD PhD [facility]

## 2016-05-01 ENCOUNTER — Other Ambulatory Visit: Payer: Self-pay | Admitting: Family Medicine

## 2016-05-01 DIAGNOSIS — Z1231 Encounter for screening mammogram for malignant neoplasm of breast: Secondary | ICD-10-CM

## 2016-05-13 ENCOUNTER — Other Ambulatory Visit: Payer: Self-pay | Admitting: Internal Medicine

## 2016-05-22 ENCOUNTER — Other Ambulatory Visit: Payer: Self-pay | Admitting: Internal Medicine

## 2016-05-26 ENCOUNTER — Ambulatory Visit
Admission: RE | Admit: 2016-05-26 | Discharge: 2016-05-26 | Disposition: A | Discharge status: Home / Self Care | Payer: Commercial Managed Care - HMO | Source: Ambulatory Visit | Attending: Family Medicine | Admitting: Family Medicine

## 2016-05-26 DIAGNOSIS — Z1231 Encounter for screening mammogram for malignant neoplasm of breast: Secondary | ICD-10-CM

## 2016-05-30 ENCOUNTER — Ambulatory Visit: Payer: Commercial Managed Care - HMO | Admitting: Internal Medicine

## 2016-07-12 ENCOUNTER — Other Ambulatory Visit: Payer: Self-pay | Admitting: Internal Medicine

## 2016-08-22 ENCOUNTER — Encounter: Payer: Self-pay | Admitting: Internal Medicine

## 2016-08-22 ENCOUNTER — Ambulatory Visit (INDEPENDENT_AMBULATORY_CARE_PROVIDER_SITE_OTHER): Payer: Commercial Managed Care - HMO | Admitting: Internal Medicine

## 2016-08-22 VITALS — BP 128/84 | HR 73 | Ht 62.5 in | Wt 182.0 lb

## 2016-08-22 DIAGNOSIS — E11319 Type 2 diabetes mellitus with unspecified diabetic retinopathy without macular edema: Secondary | ICD-10-CM

## 2016-08-22 DIAGNOSIS — Z794 Long term (current) use of insulin: Secondary | ICD-10-CM | POA: Diagnosis not present

## 2016-08-22 LAB — POCT GLYCOSYLATED HEMOGLOBIN (HGB A1C): Hemoglobin A1C: 8.5

## 2016-08-22 NOTE — Addendum Note (Signed)
Addended by: Darene LamerHOMPSON, Sagan Maselli T on: 08/22/2016 10:44 AM   Modules accepted: Orders

## 2016-08-22 NOTE — Progress Notes (Signed)
Patient ID: Kristen [name], female   DOB: 11-17-1950, 66 y.o.   MRN: [id number]  HPI: Kristen [name] is a 66 y.o.-year-old female, returning for f/u for DM2, dx in 2000, insulin-dependent since 2012, uncontrolled, with complications (DR). Last visit 5 mo ago.  She changed to a less stressful job recently.  Last hemoglobin A1c was: Lab Results  Component Value Date   HGBA1C 9.3 04/09/2016   HGBA1C 9.3 10/30/2015   HGBA1C 8.6 07/18/2015   She is on: - Basaglar 40 units at bedtime - Victoza 1.8 mg in am, before b'fast - Glipizide 10 mg 2x a day, before breakfast and before dinner - Jardiance 25 mg daily before b'fast. - added 03/2016 - stopped 06/2016  >> sugars increased She tried Metformin ER 500 mg >> still N/D/AP.  Since last visit, she started a diabetic supplement.  Pt checks her sugars 2x a day and they are better: - am: 80-140 >> 110-130 >> 124-141 >> 138-159 >> 105-140 - 2h after b'fast: n/c >> 180-203 - before lunch: n/c  - 2h after lunch: n/c - before dinner: n/c >> 128-299 >> 165-179 >> 175-260 >> 210-220  - 2h after dinner: 280-295 >> n/c (snack after dinner: fruit, icecream) - bedtime: n/c >>189-273 >> 179-195, 265 >> 160s >> 180 - nighttime: 65 >> n/c No lows now: Lowest 110 >> 124 >> 138 >> 105; she has hypoglycemia awareness at 70s.  Highest sugar was 360 >> 299 >> 265 >> 295 >> 250.  Glucometer: AccuChek  Pt's meals are - smaller meals since 04/2015: - Breakfast: yoghurt (stopped eggs); oatmeal + walnuts + raisins >> boiled egg, protein shakes - Snack: fruit  - Lunch: meat + salad + apple or apple sauce - Snack: PB and peanuts - Dinner: meat + veggies - Snacks: 2 - tuna fish + 6 crackers; peanuts; protein shakes  Still walking - 1 mile every other day.    - no CKD, last BUN/creatinine:  Lab Results  Component Value Date   BUN 15 01/16/2016   CREATININE 0.66 01/16/2016  She is on losartan. - last set of lipids: Lab Results  Component Value Date   CHOL  160 01/16/2016   HDL 52 01/16/2016   LDLCALC 80 01/16/2016   TRIG 138 01/16/2016   CHOLHDL 3.1 01/16/2016   - last eye exam was in 06/2016. + DR. + Glaucoma. Dr. Phineas Real ([facility].) - no numbness and tingling in her feet.  She also has a history of GERD, hypertension.  ROS: Constitutional: No weight loss/gain, no fatigue, no subjective hyperthermia/hypothermia Eyes: no blurry vision, no xerophthalmia ENT: no sore throat, no nodules palpated in throat, no dysphagia/odynophagia, no hoarseness Cardiovascular: no CP/SOB/palpitations/leg swelling Respiratory: no cough/SOB Gastrointestinal: no N/V/D/C/acid reflux Musculoskeletal: no muscle/joint aches Skin: no rashes Neurological: no tremors/numbness/tingling/dizziness   I reviewed pt's medications, allergies, PMH, social hx, family hx, and changes were documented in the history of present illness. Otherwise, unchanged from my initial visit note.  Past Medical History:  Diagnosis Date  . Diabetes mellitus   . Glaucoma   . HTN (hypertension)   . Meralgia paresthetica    Past Surgical History:  Procedure Laterality Date  . BREAST CYST EXCISION    . CHOLECYSTECTOMY  06/16/2008  . TONSILLECTOMY AND ADENOIDECTOMY     Social History   Social History  . Marital Status: Widowed    Spouse Name: N/A  . Number of Children: 1   Occupational History  .    Social History  Main Topics  . Smoking status: Never Smoker   . Smokeless tobacco: Not on file  . Alcohol Use: No  . Drug Use: No   Current Outpatient Prescriptions on File Prior to Visit  Medication Sig Dispense Refill  . Ascorbic Acid (VITAMIN C) 1000 MG tablet Take 1,000 mg by mouth daily.    . Blood Glucose Monitoring Suppl (BLOOD GLUCOSE METER KIT AND SUPPLIES) KIT Test blood sugar as directed. 1 each 0  . Calcium Carbonate-Vit D-Min (CALCIUM 1200 PO) Take by mouth.    . empagliflozin (JARDIANCE) 25 MG TABS tablet Take 25 mg by mouth daily. 30 tablet 2  .  ergocalciferol (VITAMIN D2) 50000 units capsule Take 1 capsule (50,000 Units total) by mouth once a week. 12 capsule 1  . glipiZIDE (GLUCOTROL) 5 MG tablet TAKE 2 TABLETS (10 MG TOTAL) BY MOUTH 2 (TWO) TIMES DAILY BEFORE A MEAL. 120 tablet 1  . glucose blood test strip Use as instructed 100 each 9  . Insulin Glargine (BASAGLAR KWIKPEN) 100 UNIT/ML SOPN Inject 0.4 mLs (40 Units total) into the skin at bedtime. 5 mL 2  . Insulin Glargine (BASAGLAR KWIKPEN) 100 UNIT/ML SOPN INJECT 0.4 MLS (40 UNITS TOTAL) INTO THE SKIN AT BEDTIME. 15 mL 2  . Insulin Pen Needle (PEN NEEDLES) 31G X 6 MM MISC 2 each by Does not apply route daily. 100 each 11  . Lancets MISC Use as directed. Lancets for meter, which ever insurance prefers 100 each 2  . losartan-hydrochlorothiazide (HYZAAR) 100-25 MG tablet Take 1 tablet by mouth daily. 90 tablet 3  . magnesium 30 MG tablet Take 30 mg by mouth 2 (two) times daily.    . Multiple Vitamin (MULTIVITAMIN) tablet Take 1 tablet by mouth daily.    . sucralfate (CARAFATE) 1 g tablet 1 tab 1 hr ac and hs 120 tablet 0  . VICTOZA 18 MG/3ML SOPN INJECT 0.3 MLS (1.8 MG TOTAL) INTO THE SKIN DAILY BEFORE BREAKFAST. 9 mL 2  . vitamin B-12 (CYANOCOBALAMIN) 100 MCG tablet Take 100 mcg by mouth daily.    . Vitamin D, Cholecalciferol, 1000 UNITS TABS Take by mouth.     No current facility-administered medications on file prior to visit.    Allergies  Allergen Reactions  . Latex Hives    itching   Family History  Problem Relation Age of Onset  . Hypertension Mother   . Diabetes Mother   . Thyroid disease Mother   . Diabetes Father   . Cancer Father     Colon cancer  . Diabetes Sister    PE: There were no vitals taken for this visit. There is no height or weight on file to calculate BMI. Wt Readings from Last 3 Encounters:  04/09/16 179 lb (81.2 kg)  01/16/16 182 lb 6.4 oz (82.7 kg)  12/28/15 182 lb (82.6 kg)   Constitutional: overweight, in NAD Eyes: PERRLA, EOMI, no  exophthalmos ENT: moist mucous membranes, no thyromegaly, no cervical lymphadenopathy Cardiovascular: RRR, No MRG Respiratory: CTA B Gastrointestinal: abdomen soft, NT, ND, BS+ Musculoskeletal: no deformities, strength intact in all 4 Skin: moist, warm, no rashes Neurological: + slight tremor with outstretched hands (chronic), DTR normal in all 4  ASSESSMENT: 1. DM2, insulin-dependent, uncontrolled, with complications - DR  PLAN:  1. Patient with long-standing, uncontrolled diabetes, on better Victoza, Basaglar, Glipizide + added Jardiance at last visit. She saw improvement in CBGs after adding it but she ran out 2 mo ago >> can only get this in  1 week. Will restart then.  - she is eating high carb, high calorie snacks between meals and protein shakes after dinner >> advised to stop - reviewed last HbA1c >> 9.3% (stable, high) - I suggested to:  Patient Instructions  Please continue: - Basaglar 40 units at bedtime - Victoza 1.8 mg in am, before b'fast - Glipizide 10 mg 2x a day, before breakfast and before dinner  Restart: - Jardiance 25 mg daily before b'fast.  Limit PB for snack and stop protein shakes at night.  Please return in 3 months with your sugar log.   - continue checking sugars at different times of the day - check 2-3 times a day, rotating checks - given more logs - advised for yearly eye exams >> she is UTD - refused flu shot - checked HbA1c today >> 8.5% (better, but still high) - Return to clinic in 3 mo with sugar log   Philemon Kingdom, MD PhD [facility]

## 2016-08-22 NOTE — Patient Instructions (Addendum)
Please continue: - Basaglar 40 units at bedtime - Victoza 1.8 mg in am, before b'fast - Glipizide 10 mg 2x a day, before breakfast and before dinner  Restart: - Jardiance 25 mg daily before b'fast.  Limit PB for snack and stop protein shakes at night.  Please return in 3 months with your sugar log.

## 2016-09-01 ENCOUNTER — Other Ambulatory Visit: Payer: Self-pay | Admitting: Internal Medicine

## 2016-09-05 ENCOUNTER — Other Ambulatory Visit: Payer: Self-pay | Admitting: Family Medicine

## 2016-09-05 NOTE — Telephone Encounter (Signed)
Last ov 01/2016

## 2016-09-05 NOTE — Telephone Encounter (Signed)
Please have pt start 2000-4000u of otc vitamin D a day now that the high dose course is complete. Does not need more prescription vitamin D

## 2016-09-06 ENCOUNTER — Other Ambulatory Visit: Payer: Self-pay | Admitting: Internal Medicine

## 2016-09-06 ENCOUNTER — Other Ambulatory Visit: Payer: Self-pay | Admitting: Family Medicine

## 2016-09-06 NOTE — Addendum Note (Signed)
Addended by: Clarene CritchleyKOLLER, KAREN M on: 09/06/2016 12:38 PM   Modules accepted: Orders

## 2016-09-06 NOTE — Telephone Encounter (Signed)
l/m with dr Leandro Reasonershaws message

## 2016-09-07 ENCOUNTER — Other Ambulatory Visit: Payer: Self-pay | Admitting: Internal Medicine

## 2016-09-22 ENCOUNTER — Ambulatory Visit (INDEPENDENT_AMBULATORY_CARE_PROVIDER_SITE_OTHER): Payer: Commercial Managed Care - HMO | Admitting: Family Medicine

## 2016-09-22 VITALS — BP 149/93 | HR 78 | Temp 98.0°F | Resp 18 | Wt 183.4 lb

## 2016-09-22 DIAGNOSIS — J309 Allergic rhinitis, unspecified: Secondary | ICD-10-CM | POA: Diagnosis not present

## 2016-09-22 DIAGNOSIS — J4520 Mild intermittent asthma, uncomplicated: Secondary | ICD-10-CM

## 2016-09-22 DIAGNOSIS — J9801 Acute bronchospasm: Secondary | ICD-10-CM

## 2016-09-22 MED ORDER — ALBUTEROL SULFATE (2.5 MG/3ML) 0.083% IN NEBU
2.5000 mg | INHALATION_SOLUTION | Freq: Once | RESPIRATORY_TRACT | Status: AC
  Administered 2016-09-22: 2.5 mg via RESPIRATORY_TRACT

## 2016-09-22 MED ORDER — ALBUTEROL SULFATE HFA 108 (90 BASE) MCG/ACT IN AERS: 1.0000 | INHALATION_SPRAY | RESPIRATORY_TRACT | 0 refills | Status: DC | PRN

## 2016-09-22 NOTE — Progress Notes (Signed)
By signing my name below, I, Kristen [name], attest that this documentation has been prepared under the direction and in the presence of Kristen Ray, MD.  Electronically Signed: Verlee [name], Medical Scribe. 09/22/16. 5:37 PM.  Subjective:    Patient ID: Kristen [name], female    DOB: 03-28-1951, 66 y.o.   MRN: [id number]  HPI Chief Complaint  Patient presents with  . URI    HPI Comments: Kristen [name] is a 66 y.o. female with a PMHx of seasonal allergies who presents to the Primary Care at [facility] and [facility] complaining of cough onset 6 days ago. Reports associated sxs of fever 1 day but not recent, congestion, and sneezing. Took mucinex for little relief of her sxs, but nothing for her allergies. Pt was on a 4 day cruise to the [place] when she became sick. Pt reports flares of her cough when she was in the casino, which is also the smoking section on the cruise - no smoker at home. Pt does not have any sick contacts and she's not a smoker. Pt had asthma as a child, but not recently.  Denies calf swelling, calf pain, and recent fever.  Patient Active Problem List   Diagnosis Date Noted  . Vitamin D deficiency 01/20/2016  . Type 2 diabetes mellitus with retinopathy, with long-term current use of insulin ([place]) 10/30/2015  . Diabetic retinopathy ([place]) 02/03/2012  . HTN (hypertension) 01/12/2012  . Obesity, Class II, BMI 35-39.9, with comorbidity 01/12/2012  . Meralgia paresthetica    Past Medical History:  Diagnosis Date  . Diabetes mellitus   . Glaucoma   . HTN (hypertension)   . Meralgia paresthetica    Past Surgical History:  Procedure Laterality Date  . BREAST CYST EXCISION    . CHOLECYSTECTOMY  06/16/2008  . TONSILLECTOMY AND ADENOIDECTOMY     Allergies  Allergen Reactions  . Latex Hives    itching   Prior to Admission medications   Medication Sig Start Date End Date Taking? Authorizing Provider  Ascorbic Acid (VITAMIN C) 1000 MG tablet Take 1,000 mg by  mouth daily.   Yes Historical Provider, MD  Blood Glucose Monitoring Suppl (BLOOD GLUCOSE METER KIT AND SUPPLIES) KIT Test blood sugar as directed. 11/21/13  Yes Barton Fanny, MD  empagliflozin (JARDIANCE) 25 MG TABS tablet Take 25 mg by mouth daily. 04/09/16  Yes Philemon Kingdom, MD  ergocalciferol (VITAMIN D2) 50000 units capsule Take 1 capsule (50,000 Units total) by mouth once a week. 01/20/16  Yes Shawnee Knapp, MD  glipiZIDE (GLUCOTROL) 5 MG tablet TAKE 2 TABLETS (10 MG TOTAL) BY MOUTH 2 (TWO) TIMES DAILY BEFORE A MEAL. 09/08/16  Yes Philemon Kingdom, MD  glucose blood test strip Use as instructed 11/21/13  Yes Barton Fanny, MD  Insulin Glargine (BASAGLAR KWIKPEN) 100 UNIT/ML SOPN Inject 0.4 mLs (40 Units total) into the skin at bedtime. 12/28/15  Yes Philemon Kingdom, MD  Insulin Glargine (BASAGLAR KWIKPEN) 100 UNIT/ML SOPN INJECT 0.4 MLS (40 UNITS TOTAL) INTO THE SKIN AT BEDTIME. 09/08/16  Yes Philemon Kingdom, MD  Insulin Pen Needle (PEN NEEDLES) 31G X 6 MM MISC 2 each by Does not apply route daily. 09/14/15  Yes Philemon Kingdom, MD  Lancets MISC Use as directed. Lancets for meter, which ever insurance prefers 09/06/15  Yes Elby Beck, FNP  losartan-hydrochlorothiazide (HYZAAR) 100-25 MG tablet Take 1 tablet by mouth daily. 01/16/16  Yes Shawnee Knapp, MD  magnesium 30 MG tablet Take 30 mg by  mouth 2 (two) times daily.   Yes Historical Provider, MD  Multiple Vitamin (MULTIVITAMIN) tablet Take 1 tablet by mouth daily.   Yes Historical Provider, MD  sucralfate (CARAFATE) 1 g tablet 1 tab 1 hr ac and hs 01/11/16  Yes Chelle Jeffery, PA-C  VICTOZA 18 MG/3ML SOPN INJECT 0.3 MLS (1.8 MG TOTAL) INTO THE SKIN DAILY BEFORE BREAKFAST. 09/01/16  Yes Philemon Kingdom, MD  VICTOZA 18 MG/3ML SOPN INJECT 0.3 MLS (1.8 MG TOTAL) INTO THE SKIN DAILY BEFORE BREAKFAST. 09/08/16  Yes Philemon Kingdom, MD  vitamin B-12 (CYANOCOBALAMIN) 100 MCG tablet Take 100 mcg by mouth daily.   Yes Historical Provider, MD    Vitamin D, Cholecalciferol, 1000 UNITS TABS Take by mouth.   Yes Historical Provider, MD   Social History   Social History  . Marital status: Widowed    Spouse name: N/A  . Number of children: N/A  . Years of education: N/A   Occupational History  . Not on file.   Social History Main Topics  . Smoking status: Never Smoker  . Smokeless tobacco: Never Used  . Alcohol use No  . Drug use: No  . Sexual activity: Yes    Birth control/ protection: None     Comment: number of sex partners in the last 12 months  0   Other Topics Concern  . Not on file   Social History Narrative  . No narrative on file   Review of Systems  Constitutional: Negative for fever.  HENT: Positive for congestion and sneezing.   Respiratory: Positive for cough.   Cardiovascular: Negative for leg swelling.  Musculoskeletal: Negative for myalgias.   Objective:  Physical Exam  Constitutional: She appears well-developed and well-nourished. No distress.  HENT:  Head: Normocephalic and atraumatic.  Eyes: Conjunctivae are normal.  Neck: Neck supple.  Cardiovascular: Normal rate, regular rhythm and normal heart sounds.  Exam reveals no gallop and no friction rub.   No murmur heard. Pulmonary/Chest: Effort normal. No respiratory distress. She has no decreased breath sounds. She has wheezes (Diffuse expiratory). She has no rhonchi. She has no rales.  Good airflow  Neurological: She is alert.  Skin: Skin is warm and dry.  Psychiatric: She has a normal mood and affect. Her behavior is normal.  Nursing note and vitals reviewed.   Vitals:   09/22/16 1650  BP: (!) 149/93  Pulse: 78  Resp: 18  Temp: 98 F (36.7 C)  TempSrc: Oral  SpO2: 100%  Weight: 183 lb 6.4 oz (83.2 kg)  Body mass index is 33.01 kg/m. Assessment & Plan:    Kristen [name] is a 66 y.o. female Bronchospasm - Plan: albuterol (PROVENTIL) (2.5 MG/3ML) 0.083% nebulizer solution 2.5 mg  Allergic rhinitis, unspecified chronicity,  unspecified seasonality, unspecified trigger  Mild intermittent asthma without complication - Plan: albuterol (PROVENTIL) (2.5 MG/3ML) 0.083% nebulizer solution 2.5 mg  Suspected viral illness with also underlying allergies, now with secondary wheezing/bronchospasm, history of mild intermittent asthma without recent flares. Improves with albuterol inhaler in office.  -albuterol inhaler provided if needed for breakthrough wheezing. RTC precautions if frequent or persistent use needed. Symptomatic care discussed for allergies and upper respiratory infection. Consider stopping Mucinex as may trigger wheeze at times. RTC precautions  Meds ordered this encounter  Medications  . albuterol (PROVENTIL) (2.5 MG/3ML) 0.083% nebulizer solution 2.5 mg  . albuterol (PROVENTIL HFA;VENTOLIN HFA) 108 (90 Base) MCG/ACT inhaler    Sig: Inhale 1-2 puffs into the lungs every 4 (four) hours as needed  for wheezing or shortness of breath.    Dispense:  1 Inhaler    Refill:  0   Patient Instructions   You likely have a virus, that is causing wheezing. See information below on wheezing. Start albuterol 1-2 puffs every 4 hours as needed for wheezing, Zyrtec over-the-counter and Flonase over the counter for allergies. Try stopping Mucinex at this time as occasionally that can cause more wheeze. If you require albuterol more than 2-3 times per day, or persistently require albuterol frequently in the next 2-3 days, return for recheck as you may need to be on prednisone.  Return to the clinic or go to the nearest emergency room if any of your symptoms worsen or new symptoms occur.   Bronchospasm, Adult Bronchospasm is a tightening of the airways going into the lungs. During an episode, it may be harder to breathe. You may cough, and you may make a whistling sound when you breathe (wheeze). This condition often affects people with asthma. What are the causes? This condition is caused by swelling and irritation in the  airways. It can be triggered by:  An infection (common).  Seasonal allergies.  An allergic reaction.  Exercise.  Irritants. These include pollution, cigarette smoke, strong odors, aerosol sprays, and paint fumes.  Weather changes. Winds increase molds and pollens in the air. Cold air may cause swelling.  Stress and emotional upset. What are the signs or symptoms? Symptoms of this condition include:  Wheezing. If the episode was triggered by an allergy, wheezing may start right away or hours later.  Nighttime coughing.  Frequent or severe coughing with a simple cold.  Chest tightness.  Shortness of breath.  Decreased ability to exercise. How is this diagnosed? This condition is usually diagnosed with a review of your medical history and a physical exam. Tests, such as lung function tests, are sometimes done to look for other conditions. The need for a chest X-[name] depends on where the wheezing occurs and whether it is the first time you have wheezed. How is this treated? This condition may be treated with:  Inhaled medicines. These open up the airways and help you breathe. They can be taken with an inhaler or a nebulizer device.  Corticosteroid medicines. These may be given for severe bronchospasm, usually when it is associated with asthma.  Avoiding triggers, such as irritants, infection, or allergies. Follow these instructions at home: Medicines   Take over-the-counter and prescription medicines only as told by your health care provider.  If you need to use an inhaler or nebulizer to take your medicine, ask your health care provider to explain how to use it correctly. If you were given a spacer, always use it with your inhaler. Lifestyle   Reduce the number of triggers in your home. To do this:  Change your heating and air conditioning filter at least once a month.  Limit your use of fireplaces and wood stoves.  Do not smoke. Do not allow smoking in your  home.  Avoid using perfumes and fragrances.  Get rid of pests, such as roaches and mice, and their droppings.  Remove any mold from your home.  Keep your house clean and dust free. Use unscented cleaning products.  Replace carpet with wood, tile, or vinyl flooring. Carpet can trap dander and dust.  Use allergy-proof pillows, mattress covers, and box spring covers.  Wash bed sheets and blankets every week in hot water. Dry them in a dryer.  Use blankets that are made of  polyester or cotton.  Wash your hands often.  Do not allow pets in your bedroom.  Avoid breathing in cold air when you exercise. General instructions   Have a plan for seeking medical care. Know when to call your health care provider and local emergency services, and where to get emergency care.  Stay up to date on your immunizations.  When you have an episode of bronchospasm, stay calm. Try to relax and breathe more slowly.  If you have asthma, make sure you have an asthma action plan.  Keep all follow-up visits as told by your health care provider. This is important. Contact a health care provider if:  You have muscle aches.  You have chest pain.  The mucus that you cough up (sputum) changes from clear or white to yellow, green, gray, or bloody.  You have a fever.  Your sputum gets thicker. Get help right away if:  Your wheezing and coughing get worse, even after you take your prescribed medicines.  It gets even harder to breathe.  You develop severe chest pain. Summary  Bronchospasm is a tightening of the airways going into the lungs.  During an episode of bronchospasm, you may have a harder time breathing. You may cough and make a whistling sound when you breathe (wheeze).  Avoid exposure to triggers such as smoke, dust, mold, animal dander, and fragrances.  When you have an episode of bronchospasm, stay calm. Try to relax and breathe more slowly. This information is not intended to  replace advice given to you by your health care provider. Make sure you discuss any questions you have with your health care provider. Document Released: 06/05/2003 Document Revised: 05/29/2016 Document Reviewed: 05/29/2016 Elsevier Interactive Patient Education  2017 [employer]  Upper Respiratory Infection, Adult Most upper respiratory infections (URIs) are a viral infection of the air passages leading to the lungs. A URI affects the nose, throat, and upper air passages. The most common type of URI is nasopharyngitis and is typically referred to as "the common cold." URIs run their course and usually go away on their own. Most of the time, a URI does not require medical attention, but sometimes a bacterial infection in the upper airways can follow a viral infection. This is called a secondary infection. Sinus and middle ear infections are common types of secondary upper respiratory infections. Bacterial pneumonia can also complicate a URI. A URI can worsen asthma and chronic obstructive pulmonary disease (COPD). Sometimes, these complications can require emergency medical care and may be life threatening. What are the causes? Almost all URIs are caused by viruses. A virus is a type of germ and can spread from one person to another. What increases the risk? You may be at risk for a URI if:  You smoke.  You have chronic heart or lung disease.  You have a weakened defense (immune) system.  You are very young or very old.  You have nasal allergies or asthma.  You work in crowded or poorly ventilated areas.  You work in health care facilities or schools. What are the signs or symptoms? Symptoms typically develop 2-3 days after you come in contact with a cold virus. Most viral URIs last 7-10 days. However, viral URIs from the influenza virus (flu virus) can last 14-18 days and are typically more severe. Symptoms may include:  Runny or stuffy (congested)  nose.  Sneezing.  Cough.  Sore throat.  Headache.  Fatigue.  Fever.  Loss of appetite.  Pain in  your forehead, behind your eyes, and over your cheekbones (sinus pain).  Muscle aches. How is this diagnosed? Your health care provider may diagnose a URI by:  Physical exam.  Tests to check that your symptoms are not due to another condition such as:  Strep throat.  Sinusitis.  Pneumonia.  Asthma. How is this treated? A URI goes away on its own with time. It cannot be cured with medicines, but medicines may be prescribed or recommended to relieve symptoms. Medicines may help:  Reduce your fever.  Reduce your cough.  Relieve nasal congestion. Follow these instructions at home:  Take medicines only as directed by your health care provider.  Gargle warm saltwater or take cough drops to comfort your throat as directed by your health care provider.  Use a warm mist humidifier or inhale steam from a shower to increase air moisture. This may make it easier to breathe.  Drink enough fluid to keep your urine clear or pale yellow.  Eat soups and other clear broths and maintain good nutrition.  Rest as needed.  Return to work when your temperature has returned to normal or as your health care provider advises. You may need to stay home longer to avoid infecting others. You can also use a face mask and careful hand washing to prevent spread of the virus.  Increase the usage of your inhaler if you have asthma.  Do not use any tobacco products, including cigarettes, chewing tobacco, or electronic cigarettes. If you need help quitting, ask your health care provider. How is this prevented? The best way to protect yourself from getting a cold is to practice good hygiene.  Avoid oral or hand contact with people with cold symptoms.  Wash your hands often if contact occurs. There is no clear evidence that vitamin C, vitamin E, echinacea, or exercise reduces the chance of  developing a cold. However, it is always recommended to get plenty of rest, exercise, and practice good nutrition. Contact a health care provider if:  You are getting worse rather than better.  Your symptoms are not controlled by medicine.  You have chills.  You have worsening shortness of breath.  You have brown or red mucus.  You have yellow or brown nasal discharge.  You have pain in your face, especially when you bend forward.  You have a fever.  You have swollen neck glands.  You have pain while swallowing.  You have white areas in the back of your throat. Get help right away if:  You have severe or persistent:  Headache.  Ear pain.  Sinus pain.  Chest pain.  You have chronic lung disease and any of the following:  Wheezing.  Prolonged cough.  Coughing up blood.  A change in your usual mucus.  You have a stiff neck.  You have changes in your:  Vision.  Hearing.  Thinking.  Mood. This information is not intended to replace advice given to you by your health care provider. Make sure you discuss any questions you have with your health care provider. Document Released: 11/26/2000 Document Revised: 02/03/2016 Document Reviewed: 09/07/2013 Elsevier Interactive Patient Education  2017 [employer].     IF you received an x-[name] today, you will receive an invoice from [facility]. Please contact [facility] at [phone] with questions or concerns regarding your invoice.   IF you received labwork today, you will receive an invoice from [facility]. Please contact [facility] at [phone] with questions or concerns regarding your invoice.  Our billing staff will not be able to assist you with questions regarding bills from these companies.  You will be contacted with the lab results as soon as they are available. The fastest way to get your results is to activate your My Chart account. Instructions are located on the last page of  this paperwork. If you have not heard from [place] regarding the results in 2 weeks, please contact this office.       I personally performed the services described in this documentation, which was scribed in my presence. The recorded information has been reviewed and considered for accuracy and completeness, addended by me as needed, and agree with information above.  Signed,   Kristen Ray, MD [facility] [place].  09/24/16 3:15 PM

## 2016-09-22 NOTE — Patient Instructions (Addendum)
You likely have a virus, that is causing wheezing. See information below on wheezing. Start albuterol 1-2 puffs every 4 hours as needed for wheezing, Zyrtec over-the-counter and Flonase over the counter for allergies. Try stopping Mucinex at this time as occasionally that can cause more wheeze. If you require albuterol more than 2-3 times per day, or persistently require albuterol frequently in the next 2-3 days, return for recheck as you may need to be on prednisone.  Return to the clinic or go to the nearest emergency room if any of your symptoms worsen or new symptoms occur.   Bronchospasm, Adult Bronchospasm is a tightening of the airways going into the lungs. During an episode, it may be harder to breathe. You may cough, and you may make a whistling sound when you breathe (wheeze). This condition often affects people with asthma. What are the causes? This condition is caused by swelling and irritation in the airways. It can be triggered by:  An infection (common).  Seasonal allergies.  An allergic reaction.  Exercise.  Irritants. These include pollution, cigarette smoke, strong odors, aerosol sprays, and paint fumes.  Weather changes. Winds increase molds and pollens in the air. Cold air may cause swelling.  Stress and emotional upset. What are the signs or symptoms? Symptoms of this condition include:  Wheezing. If the episode was triggered by an allergy, wheezing may start right away or hours later.  Nighttime coughing.  Frequent or severe coughing with a simple cold.  Chest tightness.  Shortness of breath.  Decreased ability to exercise. How is this diagnosed? This condition is usually diagnosed with a review of your medical history and a physical exam. Tests, such as lung function tests, are sometimes done to look for other conditions. The need for a chest X-ray depends on where the wheezing occurs and whether it is the first time you have wheezed. How is this  treated? This condition may be treated with:  Inhaled medicines. These open up the airways and help you breathe. They can be taken with an inhaler or a nebulizer device.  Corticosteroid medicines. These may be given for severe bronchospasm, usually when it is associated with asthma.  Avoiding triggers, such as irritants, infection, or allergies. Follow these instructions at home: Medicines   Take over-the-counter and prescription medicines only as told by your health care provider.  If you need to use an inhaler or nebulizer to take your medicine, ask your health care provider to explain how to use it correctly. If you were given a spacer, always use it with your inhaler. Lifestyle   Reduce the number of triggers in your home. To do this:  Change your heating and air conditioning filter at least once a month.  Limit your use of fireplaces and wood stoves.  Do not smoke. Do not allow smoking in your home.  Avoid using perfumes and fragrances.  Get rid of pests, such as roaches and mice, and their droppings.  Remove any mold from your home.  Keep your house clean and dust free. Use unscented cleaning products.  Replace carpet with wood, tile, or vinyl flooring. Carpet can trap dander and dust.  Use allergy-proof pillows, mattress covers, and box spring covers.  Wash bed sheets and blankets every week in hot water. Dry them in a dryer.  Use blankets that are made of polyester or cotton.  Wash your hands often.  Do not allow pets in your bedroom.  Avoid breathing in cold air when you exercise. General  instructions   Have a plan for seeking medical care. Know when to call your health care provider and local emergency services, and where to get emergency care.  Stay up to date on your immunizations.  When you have an episode of bronchospasm, stay calm. Try to relax and breathe more slowly.  If you have asthma, make sure you have an asthma action plan.  Keep all  follow-up visits as told by your health care provider. This is important. Contact a health care provider if:  You have muscle aches.  You have chest pain.  The mucus that you cough up (sputum) changes from clear or white to yellow, green, gray, or bloody.  You have a fever.  Your sputum gets thicker. Get help right away if:  Your wheezing and coughing get worse, even after you take your prescribed medicines.  It gets even harder to breathe.  You develop severe chest pain. Summary  Bronchospasm is a tightening of the airways going into the lungs.  During an episode of bronchospasm, you may have a harder time breathing. You may cough and make a whistling sound when you breathe (wheeze).  Avoid exposure to triggers such as smoke, dust, mold, animal dander, and fragrances.  When you have an episode of bronchospasm, stay calm. Try to relax and breathe more slowly. This information is not intended to replace advice given to you by your health care provider. Make sure you discuss any questions you have with your health care provider. Document Released: 06/05/2003 Document Revised: 05/29/2016 Document Reviewed: 05/29/2016 Elsevier Interactive Patient Education  2017 [employer]  Upper Respiratory Infection, Adult Most upper respiratory infections (URIs) are a viral infection of the air passages leading to the lungs. A URI affects the nose, throat, and upper air passages. The most common type of URI is nasopharyngitis and is typically referred to as "the common cold." URIs run their course and usually go away on their own. Most of the time, a URI does not require medical attention, but sometimes a bacterial infection in the upper airways can follow a viral infection. This is called a secondary infection. Sinus and middle ear infections are common types of secondary upper respiratory infections. Bacterial pneumonia can also complicate a URI. A URI can worsen asthma and chronic obstructive  pulmonary disease (COPD). Sometimes, these complications can require emergency medical care and may be life threatening. What are the causes? Almost all URIs are caused by viruses. A virus is a type of germ and can spread from one person to another. What increases the risk? You may be at risk for a URI if:  You smoke.  You have chronic heart or lung disease.  You have a weakened defense (immune) system.  You are very young or very old.  You have nasal allergies or asthma.  You work in crowded or poorly ventilated areas.  You work in health care facilities or schools. What are the signs or symptoms? Symptoms typically develop 2-3 days after you come in contact with a cold virus. Most viral URIs last 7-10 days. However, viral URIs from the influenza virus (flu virus) can last 14-18 days and are typically more severe. Symptoms may include:  Runny or stuffy (congested) nose.  Sneezing.  Cough.  Sore throat.  Headache.  Fatigue.  Fever.  Loss of appetite.  Pain in your forehead, behind your eyes, and over your cheekbones (sinus pain).  Muscle aches. How is this diagnosed? Your health care provider may diagnose a URI  by:  Physical exam.  Tests to check that your symptoms are not due to another condition such as:  Strep throat.  Sinusitis.  Pneumonia.  Asthma. How is this treated? A URI goes away on its own with time. It cannot be cured with medicines, but medicines may be prescribed or recommended to relieve symptoms. Medicines may help:  Reduce your fever.  Reduce your cough.  Relieve nasal congestion. Follow these instructions at home:  Take medicines only as directed by your health care provider.  Gargle warm saltwater or take cough drops to comfort your throat as directed by your health care provider.  Use a warm mist humidifier or inhale steam from a shower to increase air moisture. This may make it easier to breathe.  Drink enough fluid to keep  your urine clear or pale yellow.  Eat soups and other clear broths and maintain good nutrition.  Rest as needed.  Return to work when your temperature has returned to normal or as your health care provider advises. You may need to stay home longer to avoid infecting others. You can also use a face mask and careful hand washing to prevent spread of the virus.  Increase the usage of your inhaler if you have asthma.  Do not use any tobacco products, including cigarettes, chewing tobacco, or electronic cigarettes. If you need help quitting, ask your health care provider. How is this prevented? The best way to protect yourself from getting a cold is to practice good hygiene.  Avoid oral or hand contact with people with cold symptoms.  Wash your hands often if contact occurs. There is no clear evidence that vitamin C, vitamin E, echinacea, or exercise reduces the chance of developing a cold. However, it is always recommended to get plenty of rest, exercise, and practice good nutrition. Contact a health care provider if:  You are getting worse rather than better.  Your symptoms are not controlled by medicine.  You have chills.  You have worsening shortness of breath.  You have brown or red mucus.  You have yellow or brown nasal discharge.  You have pain in your face, especially when you bend forward.  You have a fever.  You have swollen neck glands.  You have pain while swallowing.  You have white areas in the back of your throat. Get help right away if:  You have severe or persistent:  Headache.  Ear pain.  Sinus pain.  Chest pain.  You have chronic lung disease and any of the following:  Wheezing.  Prolonged cough.  Coughing up blood.  A change in your usual mucus.  You have a stiff neck.  You have changes in your:  Vision.  Hearing.  Thinking.  Mood. This information is not intended to replace advice given to you by your health care provider. Make  sure you discuss any questions you have with your health care provider. Document Released: 11/26/2000 Document Revised: 02/03/2016 Document Reviewed: 09/07/2013 Elsevier Interactive Patient Education  2017 [employer].     IF you received an x-ray today, you will receive an invoice from [facility]. Please contact [facility] at [phone] with questions or concerns regarding your invoice.   IF you received labwork today, you will receive an invoice from [facility]. Please contact [facility] at [phone] with questions or concerns regarding your invoice.   Our billing staff will not be able to assist you with questions regarding bills from these companies.  You will be contacted with the lab  results as soon as they are available. The fastest way to get your results is to activate your My Chart account. Instructions are located on the last page of this paperwork. If you have not heard from [place] regarding the results in 2 weeks, please contact this office.

## 2016-11-08 ENCOUNTER — Other Ambulatory Visit: Payer: Self-pay | Admitting: Internal Medicine

## 2016-11-21 ENCOUNTER — Encounter: Payer: Self-pay | Admitting: Internal Medicine

## 2016-11-21 ENCOUNTER — Ambulatory Visit (INDEPENDENT_AMBULATORY_CARE_PROVIDER_SITE_OTHER): Payer: Commercial Managed Care - HMO | Admitting: Internal Medicine

## 2016-11-21 VITALS — BP 126/88 | HR 77 | Wt 180.6 lb

## 2016-11-21 DIAGNOSIS — Z794 Long term (current) use of insulin: Secondary | ICD-10-CM | POA: Diagnosis not present

## 2016-11-21 DIAGNOSIS — E11319 Type 2 diabetes mellitus with unspecified diabetic retinopathy without macular edema: Secondary | ICD-10-CM

## 2016-11-21 LAB — POCT GLYCOSYLATED HEMOGLOBIN (HGB A1C): Hemoglobin A1C: 8.7

## 2016-11-21 MED ORDER — EMPAGLIFLOZIN 25 MG PO TABS: 25.0000 mg | ORAL_TABLET | Freq: Every day | ORAL | 3 refills | Status: DC

## 2016-11-21 NOTE — Progress Notes (Signed)
Patient ID: Kristen [name], female   DOB: Oct 06, 1950, 66 y.o.   MRN: [id number]  HPI: Kristen [name] is a 65 y.o.-year-old female, returning for f/u for DM2, dx in 2000, insulin-dependent since 2012, uncontrolled, with complications (DR). Last visit 6 mo ago.  She went on a Cruise this Spring and will going another one in October.  Last hemoglobin A1c was: Lab Results  Component Value Date   HGBA1C 8.5 08/22/2016   HGBA1C 9.3 04/09/2016   HGBA1C 9.3 10/30/2015   She is on: - Basaglar 40 units at bedtime - Victoza 1.8 mg in am, before b'fast - Glipizide 10 mg 2x a day, before breakfast and before dinner She was on Jardiance 25 mg daily before b'fast. - added 03/2016 - stopped 06/2016  >> sugars increased >> did not restart at last visit as advised She tried Metformin ER 500 mg >> still N/D/AP.  Pt checks her sugars 2x a day and they are: - am: 80-140 >> 110-130 >> 124-141 >> 138-159 >> 105-140 >> 133-148 - 2h after b'fast: n/c >> 180-203 >> n/c - before lunch: n/c  - 2h after lunch: n/c - before dinner: n/c >> 128-299 >> 165-179 >> 175-260 >> 210-220 >> 265-285 except when she has a low carb meal - 2h after dinner: 280-295 >> n/c (snack after dinner: fruit, icecream) - bedtime: n/c >>189-273 >> 179-195, 265 >> 160s >> 180 >> n/c - nighttime: 65 >> n/c No lows now: Lowest 105; she has hypoglycemia awareness at 70s.  Highest sugar was 285.  Glucometer: AccuChek  Pt's meals are - smaller meals since 04/2015: - Breakfast: yoghurt (stopped eggs); oatmeal + walnuts + raisins >> boiled egg, protein shakes - Snack: fruit  - Lunch: meat + salad + apple or apple sauce - Snack: PB and peanuts - Dinner: meat + veggies - Snacks: 2 - tuna fish + 6 crackers; peanuts; protein shakes  Still walking - 1 mi/day.   - no CKD, last BUN/creatinine:  Lab Results  Component Value Date   BUN 15 01/16/2016   CREATININE 0.66 01/16/2016  She is on Losartan. - last set of lipids: Lab Results   Component Value Date   CHOL 160 01/16/2016   HDL 52 01/16/2016   LDLCALC 80 01/16/2016   TRIG 138 01/16/2016   CHOLHDL 3.1 01/16/2016   - last eye exam was in 06/2016. She has DR. + Glaucoma. Dr. Phineas Real ([place] - denies numbness and tingling in her feet.  She also has a history of GERD, hypertension.  ROS: Constitutional: no weight gain/no weight loss, no fatigue, no subjective hyperthermia, no subjective hypothermia Eyes: no blurry vision, no xerophthalmia ENT: no sore throat, no nodules palpated in throat, no dysphagia, no odynophagia, no hoarseness Cardiovascular: no CP/no SOB/no palpitations/no leg swelling Respiratory: +cough/no SOB/+ wheezing Gastrointestinal: no N/no V/no D/no C/no acid reflux/+ AP Musculoskeletal: no muscle aches/no joint aches Skin: no rashes, no hair loss Neurological: no tremors/no numbness/no tingling/no dizziness  I reviewed pt's medications, allergies, PMH, social hx, family hx, and changes were documented in the history of present illness. Otherwise, unchanged from my initial visit note.  Past Medical History:  Diagnosis Date  . Diabetes mellitus   . Glaucoma   . HTN (hypertension)   . Meralgia paresthetica    Past Surgical History:  Procedure Laterality Date  . BREAST CYST EXCISION    . CHOLECYSTECTOMY  06/16/2008  . TONSILLECTOMY AND ADENOIDECTOMY     Social History   Social History  .  Marital Status: Widowed    Spouse Name: N/A  . Number of Children: 1   Occupational History  .    Social History Main Topics  . Smoking status: Never Smoker   . Smokeless tobacco: Not on file  . Alcohol Use: No  . Drug Use: No   Current Outpatient Prescriptions on File Prior to Visit  Medication Sig Dispense Refill  . albuterol (PROVENTIL HFA;VENTOLIN HFA) 108 (90 Base) MCG/ACT inhaler Inhale 1-2 puffs into the lungs every 4 (four) hours as needed for wheezing or shortness of breath. 1 Inhaler 0  . Ascorbic Acid (VITAMIN C) 1000 MG  tablet Take 1,000 mg by mouth daily.    . Blood Glucose Monitoring Suppl (BLOOD GLUCOSE METER KIT AND SUPPLIES) KIT Test blood sugar as directed. 1 each 0  . empagliflozin (JARDIANCE) 25 MG TABS tablet Take 25 mg by mouth daily. 30 tablet 2  . ergocalciferol (VITAMIN D2) 50000 units capsule Take 1 capsule (50,000 Units total) by mouth once a week. 12 capsule 1  . glipiZIDE (GLUCOTROL) 5 MG tablet TAKE 2 TABLETS (10 MG TOTAL) BY MOUTH 2 (TWO) TIMES DAILY BEFORE A MEAL. 120 tablet 1  . glucose blood test strip Use as instructed 100 each 9  . Insulin Glargine (BASAGLAR KWIKPEN) 100 UNIT/ML SOPN Inject 0.4 mLs (40 Units total) into the skin at bedtime. 5 mL 2  . Insulin Glargine (BASAGLAR KWIKPEN) 100 UNIT/ML SOPN INJECT 0.4 MLS (40 UNITS TOTAL) INTO THE SKIN AT BEDTIME. 15 pen 2  . Insulin Pen Needle (PEN NEEDLES) 31G X 6 MM MISC 2 each by Does not apply route daily. 100 each 11  . Lancets MISC Use as directed. Lancets for meter, which ever insurance prefers 100 each 2  . losartan-hydrochlorothiazide (HYZAAR) 100-25 MG tablet Take 1 tablet by mouth daily. 90 tablet 3  . magnesium 30 MG tablet Take 30 mg by mouth 2 (two) times daily.    . Multiple Vitamin (MULTIVITAMIN) tablet Take 1 tablet by mouth daily.    . sucralfate (CARAFATE) 1 g tablet 1 tab 1 hr ac and hs 120 tablet 0  . VICTOZA 18 MG/3ML SOPN INJECT 0.3 MLS (1.8 MG TOTAL) INTO THE SKIN DAILY BEFORE BREAKFAST. 9 pen 2  . VICTOZA 18 MG/3ML SOPN INJECT 0.3 MLS (1.8 MG TOTAL) INTO THE SKIN DAILY BEFORE BREAKFAST. 9 pen 2  . vitamin B-12 (CYANOCOBALAMIN) 100 MCG tablet Take 100 mcg by mouth daily.    . Vitamin D, Cholecalciferol, 1000 UNITS TABS Take by mouth.     No current facility-administered medications on file prior to visit.    Allergies  Allergen Reactions  . Latex Hives    itching   Family History  Problem Relation Age of Onset  . Hypertension Mother   . Diabetes Mother   . Thyroid disease Mother   . Diabetes Father   .  Cancer Father        Colon cancer  . Diabetes Sister    PE: BP 126/88 (BP Location: Left Arm, Patient Position: Sitting, Cuff Size: Normal)   Pulse 77   Wt 180 lb 9.6 oz (81.9 kg)   SpO2 99%   BMI 32.51 kg/m  Body mass index is 32.51 kg/m. Wt Readings from Last 3 Encounters:  11/21/16 180 lb 9.6 oz (81.9 kg)  09/22/16 183 lb 6.4 oz (83.2 kg)  08/22/16 182 lb (82.6 kg)   Constitutional: overweight, in NAD Eyes: PERRLA, EOMI, no exophthalmos ENT: moist mucous membranes, no thyromegaly,  no cervical lymphadenopathy Cardiovascular: RRR, No MRG Respiratory: CTA B Gastrointestinal: abdomen soft, NT, ND, BS+ Musculoskeletal: no deformities, strength intact in all 4 Skin: moist, warm, no rashes Neurological: no tremor with outstretched hands, DTR normal in all 4  ASSESSMENT: 1. DM2, insulin-dependent, uncontrolled, with complications - DR  PLAN:  1. Patient with long-standing, Uncontrolled diabetes, with worse control after she came off Jardiance. At last visit, we discussed about restarting it, especially since this is covered by her insurance. She is telling me that she never received the medication from her mail-order pharmacy... I sent another prescription and she agrees to start it now. - She noticed that her sugars are better if she eats a low carb diet, she is planning to start this - I suggested to:  Patient Instructions  Please continue: - Basaglar 40 units at bedtime - Victoza 1.8 mg in am, before b'fast - Glipizide 10 mg 2x a day, before breakfast and before dinner  Restart: - Jardiance 25 mg daily before b'fast.  Please return in 3 months with your sugar log.   - today, HbA1c is 8.7% (higher) - continue checking sugars at different times of the day - check 2x a day, rotating checks - advised for yearly eye exams >> she is UTD - Return to clinic in 3 mo with sugar log. She will need a BMP then.   Philemon Kingdom, MD PhD [facility]

## 2016-11-21 NOTE — Patient Instructions (Signed)
Please continue: - Basaglar 40 units at bedtime - Victoza 1.8 mg in am, before b'fast - Glipizide 10 mg 2x a day, before breakfast and before dinner  Restart: - Jardiance 25 mg daily before b'fast.  Please return in 3 months with your sugar log.

## 2016-12-07 ENCOUNTER — Other Ambulatory Visit: Payer: Self-pay | Admitting: Internal Medicine

## 2017-01-07 ENCOUNTER — Other Ambulatory Visit: Payer: Self-pay

## 2017-01-07 DIAGNOSIS — E669 Obesity, unspecified: Secondary | ICD-10-CM

## 2017-01-07 DIAGNOSIS — E1169 Type 2 diabetes mellitus with other specified complication: Secondary | ICD-10-CM

## 2017-01-09 ENCOUNTER — Other Ambulatory Visit: Payer: Self-pay

## 2017-01-09 DIAGNOSIS — E1169 Type 2 diabetes mellitus with other specified complication: Secondary | ICD-10-CM

## 2017-01-09 DIAGNOSIS — E669 Obesity, unspecified: Secondary | ICD-10-CM

## 2017-01-09 MED ORDER — BASAGLAR KWIKPEN 100 UNIT/ML ~~LOC~~ SOPN: 40.0000 [IU] | PEN_INJECTOR | Freq: Every day | SUBCUTANEOUS | 2 refills | Status: DC

## 2017-01-09 MED ORDER — LIRAGLUTIDE 18 MG/3ML ~~LOC~~ SOPN: 1.8000 mg | PEN_INJECTOR | Freq: Every day | SUBCUTANEOUS | 2 refills | Status: DC

## 2017-01-09 MED ORDER — GLIPIZIDE 5 MG PO TABS: 10.0000 mg | ORAL_TABLET | Freq: Two times a day (BID) | ORAL | 1 refills | Status: DC

## 2017-01-09 MED ORDER — PEN NEEDLES 31G X 6 MM MISC: 2.0000 | Freq: Every day | 11 refills | Status: DC

## 2017-01-13 ENCOUNTER — Other Ambulatory Visit: Payer: Self-pay

## 2017-01-13 MED ORDER — GLUCOSE BLOOD VI STRP: ORAL_STRIP | 5 refills | Status: DC

## 2017-01-23 ENCOUNTER — Other Ambulatory Visit: Payer: Self-pay | Admitting: Family Medicine

## 2017-01-27 ENCOUNTER — Other Ambulatory Visit: Payer: Self-pay | Admitting: Internal Medicine

## 2017-01-28 ENCOUNTER — Other Ambulatory Visit: Payer: Self-pay

## 2017-01-28 ENCOUNTER — Other Ambulatory Visit: Payer: Self-pay | Admitting: Internal Medicine

## 2017-01-28 ENCOUNTER — Telehealth: Payer: Self-pay | Admitting: Internal Medicine

## 2017-01-28 MED ORDER — LIRAGLUTIDE 18 MG/3ML ~~LOC~~ SOPN: 1.8000 mg | PEN_INJECTOR | Freq: Every day | SUBCUTANEOUS | 2 refills | Status: DC

## 2017-01-28 MED ORDER — GLUCOSE BLOOD VI STRP: ORAL_STRIP | 5 refills | Status: DC

## 2017-01-28 NOTE — Telephone Encounter (Signed)
Please advise, I submitted the strips and victoza; do you refill the losartan? I thought it came from PCP I believe?

## 2017-01-28 NOTE — Telephone Encounter (Signed)
Patient called to check the status of the losartan rx. I advised her that the nurse is waiting on Dr. Elvera LennoxGherghe approval due to her not being the PCP, patient is going to call her urgent care PCP.

## 2017-01-28 NOTE — Telephone Encounter (Signed)
MEDICATION: Losartan 100-25 mg tab  PHARMACY: [facility]  [phone]   IS THIS A 90 DAY SUPPLY : says she will start getting 90 supp b/c mail order  IS PATIENT OUT OF MEDICTAION: yes  IF NOT; HOW MUCH IS LEFT:   LAST APPOINTMENT DATE: 06/08  NEXT APPOINTMENT DATE: 09/14  OTHER COMMENTS: Patient states this script to [facility] on [address] and it was denied by Dr. Elvera LennoxGherghe. Patient is not sure if Dr. Elvera LennoxGherghe ever prescribed her Losartan. Has not been able to check bs since she left b/c she is out of strips. She has accu chek meter.  MEDICATION: Accu Chek Strps  PHARMACY: [facility]   IS THIS A 90 DAY SUPPLY : yes  IS PATIENT OUT OF MEDICTAION: yes  IF NOT; HOW MUCH IS LEFT:   LAST APPOINTMENT DATE: 06/08  NEXT APPOINTMENT DATE: 09/14   OTHER COMMENTS:    **Let patient know to contact pharmacy at the end of the day to make sure medication is ready. **  ** Please notify patient to allow 48-72 hours to process**  **Encourage patient to contact the pharmacy for refills or they can request  [facility]**  MEDICATION: Victoza 18mg /53ml SOPN  PHARMACY: [facility]   IS THIS A 90 DAY SUPPLY : yes  IS PATIENT OUT OF MEDICTAION: no  IF NOT; HOW MUCH IS LEFT:  1 left (two in the box)  LAST APPOINTMENT DATE: 06/08  NEXT APPOINTMENT DATE: 09/14  OTHER COMMENTS:    **Let patient know to contact pharmacy at the end of the day to make sure medication is ready. **  ** Please notify patient to allow 48-72 hours to process**  **Encourage patient to contact the pharmacy for refills or they can [facility]**

## 2017-01-29 ENCOUNTER — Other Ambulatory Visit: Payer: Self-pay

## 2017-01-29 ENCOUNTER — Ambulatory Visit (INDEPENDENT_AMBULATORY_CARE_PROVIDER_SITE_OTHER): Payer: Commercial Managed Care - HMO | Admitting: Family Medicine

## 2017-01-29 ENCOUNTER — Encounter: Payer: Self-pay | Admitting: Family Medicine

## 2017-01-29 VITALS — BP 147/89 | HR 76 | Temp 98.0°F | Resp 17 | Ht 62.5 in | Wt 189.4 lb

## 2017-01-29 DIAGNOSIS — I10 Essential (primary) hypertension: Secondary | ICD-10-CM | POA: Diagnosis not present

## 2017-01-29 DIAGNOSIS — E11319 Type 2 diabetes mellitus with unspecified diabetic retinopathy without macular edema: Secondary | ICD-10-CM | POA: Diagnosis not present

## 2017-01-29 DIAGNOSIS — Z794 Long term (current) use of insulin: Secondary | ICD-10-CM

## 2017-01-29 DIAGNOSIS — E559 Vitamin D deficiency, unspecified: Secondary | ICD-10-CM | POA: Diagnosis not present

## 2017-01-29 DIAGNOSIS — Z1211 Encounter for screening for malignant neoplasm of colon: Secondary | ICD-10-CM

## 2017-01-29 DIAGNOSIS — E669 Obesity, unspecified: Secondary | ICD-10-CM

## 2017-01-29 DIAGNOSIS — IMO0001 Reserved for inherently not codable concepts without codable children: Secondary | ICD-10-CM

## 2017-01-29 MED ORDER — GLUCOSE BLOOD VI STRP: ORAL_STRIP | 5 refills | Status: DC

## 2017-01-29 MED ORDER — LOSARTAN POTASSIUM-HCTZ 100-25 MG PO TABS: 1.0000 | ORAL_TABLET | Freq: Every day | ORAL | 3 refills | Status: DC

## 2017-01-29 NOTE — Telephone Encounter (Signed)
Ok to refill 

## 2017-01-29 NOTE — Patient Instructions (Signed)
     IF you received an x-ray today, you will receive an invoice from [facility]. Please contact  Radiology at [phone] with questions or concerns regarding your invoice.   IF you received labwork today, you will receive an invoice from [facility]. Please contact [facility] at [phone] with questions or concerns regarding your invoice.   Our billing staff will not be able to assist you with questions regarding bills from these companies.  You will be contacted with the lab results as soon as they are available. The fastest way to get your results is to activate your My Chart account. Instructions are located on the last page of this paperwork. If you have not heard from us regarding the results in 2 weeks, please contact this office.     

## 2017-01-29 NOTE — Telephone Encounter (Signed)
Please advise. Do we fill that RX? Thank you!

## 2017-01-29 NOTE — Telephone Encounter (Signed)
Refilled

## 2017-01-29 NOTE — Telephone Encounter (Signed)
Routing to you °
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
## 2017-01-30 ENCOUNTER — Other Ambulatory Visit: Payer: Self-pay

## 2017-01-30 LAB — LIPID PANEL
Chol/HDL Ratio: 3.4 ratio (ref 0.0–4.4)
Cholesterol, Total: 160 mg/dL (ref 100–199)
HDL: 47 mg/dL (ref >39)
LDL Calculated: 81 mg/dL (ref 0–99)
Triglycerides: 161 mg/dL — ABNORMAL HIGH (ref 0–149)
VLDL Cholesterol Cal: 32 mg/dL (ref 5–40)

## 2017-01-30 LAB — COMPREHENSIVE METABOLIC PANEL
ALT: 26 IU/L (ref 0–32)
AST: 23 IU/L (ref 0–40)
Albumin/Globulin Ratio: 1.7 (ref 1.2–2.2)
Albumin: 4.5 g/dL (ref 3.6–4.8)
Alkaline Phosphatase: 75 IU/L (ref 39–117)
BUN/Creatinine Ratio: 24 (ref 12–28)
BUN: 15 mg/dL (ref 8–27)
Bilirubin Total: <0.2 mg/dL (ref 0.0–1.2)
CO2: 22 mmol/L (ref 20–29)
Calcium: 9.5 mg/dL (ref 8.7–10.3)
Chloride: 99 mmol/L (ref 96–106)
Creatinine, Ser: 0.62 mg/dL (ref 0.57–1.00)
GFR calc Af Amer: 109 mL/min/{1.73_m2} (ref >59)
GFR calc non Af Amer: 95 mL/min/{1.73_m2} (ref >59)
Globulin, Total: 2.6 g/dL (ref 1.5–4.5)
Glucose: 175 mg/dL — ABNORMAL HIGH (ref 65–99)
Potassium: 4.2 mmol/L (ref 3.5–5.2)
Sodium: 138 mmol/L (ref 134–144)
Total Protein: 7.1 g/dL (ref 6.0–8.5)

## 2017-01-30 LAB — VITAMIN D 25 HYDROXY (VIT D DEFICIENCY, FRACTURES): Vit D, 25-Hydroxy: 30.5 ng/mL (ref 30.0–100.0)

## 2017-01-30 LAB — MICROALBUMIN / CREATININE URINE RATIO
Creatinine, Urine: 52.8 mg/dL
Microalb/Creat Ratio: 7.6 mg/g creat (ref 0.0–30.0)
Microalbumin, Urine: 4 ug/mL

## 2017-02-27 ENCOUNTER — Ambulatory Visit: Payer: Commercial Managed Care - HMO | Admitting: Internal Medicine

## 2017-04-17 ENCOUNTER — Telehealth: Payer: Self-pay | Admitting: Internal Medicine

## 2017-04-17 NOTE — Telephone Encounter (Signed)
Last seen in June and advised to F/U in 3 months then cancelled/denied/must schedule appt first/thx dmf

## 2017-04-23 ENCOUNTER — Ambulatory Visit: Payer: Commercial Managed Care - HMO | Admitting: Internal Medicine

## 2017-04-23 ENCOUNTER — Encounter: Payer: Self-pay | Admitting: Internal Medicine

## 2017-04-23 VITALS — BP 120/88 | HR 82 | Wt 188.0 lb

## 2017-04-23 DIAGNOSIS — Z794 Long term (current) use of insulin: Secondary | ICD-10-CM

## 2017-04-23 DIAGNOSIS — E11319 Type 2 diabetes mellitus with unspecified diabetic retinopathy without macular edema: Secondary | ICD-10-CM | POA: Diagnosis not present

## 2017-04-23 DIAGNOSIS — E66811 Obesity, class 1: Secondary | ICD-10-CM | POA: Insufficient documentation

## 2017-04-23 DIAGNOSIS — E669 Obesity, unspecified: Secondary | ICD-10-CM | POA: Diagnosis not present

## 2017-04-23 LAB — POCT GLYCOSYLATED HEMOGLOBIN (HGB A1C): Hemoglobin A1C: 9.4

## 2017-04-23 MED ORDER — INSULIN ASPART 100 UNIT/ML FLEXPEN: 8.0000 [IU] | PEN_INJECTOR | Freq: Three times a day (TID) | SUBCUTANEOUS | 3 refills | Status: DC

## 2017-04-23 NOTE — Addendum Note (Signed)
Addended by: Darene LamerHOMPSON, Karson Chicas T on: 04/23/2017 04:50 PM   Modules accepted: Orders

## 2017-04-23 NOTE — Progress Notes (Signed)
Patient ID: Kristen [name], female   DOB: 1950-12-09, 66 y.o.   MRN: [id number]  HPI: Kristen [name] is a 66 y.o.-year-old female, returning for f/u for DM2, dx in 2000, insulin-dependent since 2012, uncontrolled, with complications (DR). Last visit 5 mo ago.  Last hemoglobin A1c was: Lab Results  Component Value Date   HGBA1C 8.7 11/21/2016   HGBA1C 8.5 08/22/2016   HGBA1C 9.3 04/09/2016   She is on: - Basaglar 40 units at bedtime - Victoza 1.8 mg in am, before b'fast - Glipizide 10 mg 2x a day, before breakfast and before dinner She was on Jardiance 25 mg daily before b'fast. - added 03/2016 - stopped 06/2016  >> sugars increased >> at last visit, I advised her to restart >> did not do so as this is expensive. She tried Metformin ER 500 mg >> still N/D/AP. She was on Humalog before >> made her arms numb  Pt checks her sugars 2x a day >> higher: - am:  138-159 >> 105-140 >> 133-148 >> 157-160 - 2h after b'fast: n/c >> 180-203 >> n/c - before lunch: n/c  - 2h after lunch: n/c - before dinner:  210-220 >> 265-285 except when she has a low carb meal >> n/c - 2h after dinner: 280-295 >> n/c (snack after dinner: fruit, icecream) - bedtime: n/c >>189-273 >> 179-195, 265 >> 160s >> 180 >> n/c >> 219-306 - nighttime: 65 >> n/c Lowest 105 >> 137; she has hypoglycemia awareness at 70s.  Highest sugar was 285 >> 300s.  Glucometer: AccuChek  Pt's meals are - smaller meals since 04/2015: - Breakfast: yoghurt (stopped eggs); oatmeal + walnuts + raisins >> boiled egg, protein shakes - Snack: fruit  - Lunch: meat + salad + apple or apple sauce - Snack: PB and peanuts - Dinner: meat + veggies - Snacks: 2 - tuna fish + 6 crackers; peanuts; protein shakes  Still walking - 1 mi/day.   - No CKD, last BUN/creatinine:  Lab Results  Component Value Date   BUN 15 01/29/2017   CREATININE 0.62 01/29/2017  On Losartan.. - + HL; last set of lipids: Lab Results  Component Value Date   CHOL 160  01/29/2017   HDL 47 01/29/2017   LDLCALC 81 01/29/2017   TRIG 161 (H) 01/29/2017   CHOLHDL 3.4 01/29/2017   - last eye exam was in 06/2016 >> + DR. + Glaucoma. Dr. Phineas Real ([place] - denies numbness and tingling in her feet.  She also has a history of GERD, hypertension.  Since last visit, she started livers support supplement containing milk thistle.  Her bowel movements are more regular and she feels better.  ROS: Constitutional: no weight gain/no weight loss, no fatigue, no subjective hyperthermia, no subjective hypothermia Eyes: no blurry vision, no xerophthalmia ENT: no sore throat, no nodules palpated in throat, no dysphagia, no odynophagia, no hoarseness Cardiovascular: no CP/no SOB/no palpitations/no leg swelling Respiratory: no cough/no SOB/no wheezing Gastrointestinal: no N/no V/no D/no C/no acid reflux Musculoskeletal: no muscle aches/no joint aches Skin: no rashes, no hair loss Neurological: no tremors/no numbness/no tingling/no dizziness  I reviewed pt's medications, allergies, PMH, social hx, family hx, and changes were documented in the history of present illness. Otherwise, unchanged from my initial visit note.  Past Medical History:  Diagnosis Date  . Diabetes mellitus   . Glaucoma   . HTN (hypertension)   . Meralgia paresthetica    Past Surgical History:  Procedure Laterality Date  . BREAST CYST EXCISION    .  CHOLECYSTECTOMY  06/16/2008  . TONSILLECTOMY AND ADENOIDECTOMY     Social History   Social History  . Marital Status: Widowed    Spouse Name: N/A  . Number of Children: 1   Occupational History  .    Social History Main Topics  . Smoking status: Never Smoker   . Smokeless tobacco: Not on file  . Alcohol Use: No  . Drug Use: No   Current Outpatient Medications on File Prior to Visit  Medication Sig Dispense Refill  . albuterol (PROVENTIL HFA;VENTOLIN HFA) 108 (90 Base) MCG/ACT inhaler Inhale 1-2 puffs into the lungs every 4  (four) hours as needed for wheezing or shortness of breath. 1 Inhaler 0  . Ascorbic Acid (VITAMIN C) 1000 MG tablet Take 1,000 mg by mouth daily.    . Blood Glucose Monitoring Suppl (BLOOD GLUCOSE METER KIT AND SUPPLIES) KIT Test blood sugar as directed. 1 each 0  . empagliflozin (JARDIANCE) 25 MG TABS tablet Take 25 mg by mouth daily. (Patient not taking: Reported on 01/29/2017) 90 tablet 3  . ergocalciferol (VITAMIN D2) 50000 units capsule Take 1 capsule (50,000 Units total) by mouth once a week. 12 capsule 1  . glipiZIDE (GLUCOTROL) 5 MG tablet Take 2 tablets (10 mg total) by mouth 2 (two) times daily before a meal. 120 tablet 1  . glucose blood (ACCU-CHEK AVIVA) test strip Use as instructed to check sugar 2 times daily 200 each 5  . glucose blood (PRODIGY NO CODING BLOOD GLUC) test strip Use as instructed to check 2 times daily 200 each 5  . Insulin Glargine (BASAGLAR KWIKPEN) 100 UNIT/ML SOPN INJECT 0.4 MLS (40 UNITS TOTAL) INTO THE SKIN AT BEDTIME. 15 pen 2  . Insulin Glargine (BASAGLAR KWIKPEN) 100 UNIT/ML SOPN Inject 0.4 mLs (40 Units total) into the skin at bedtime. 15 pen 2  . Insulin Pen Needle (PEN NEEDLES) 31G X 6 MM MISC 2 each by Does not apply route daily. 100 each 11  . Lancets MISC Use as directed. Lancets for meter, which ever insurance prefers 100 each 2  . liraglutide (VICTOZA) 18 MG/3ML SOPN Inject 0.3 mLs (1.8 mg total) into the skin daily before breakfast. 9 pen 2  . losartan-hydrochlorothiazide (HYZAAR) 100-25 MG tablet Take 1 tablet by mouth daily. 90 tablet 3  . magnesium 30 MG tablet Take 30 mg by mouth 2 (two) times daily.    . Multiple Vitamin (MULTIVITAMIN) tablet Take 1 tablet by mouth daily.    . sucralfate (CARAFATE) 1 g tablet 1 tab 1 hr ac and hs 120 tablet 0  . vitamin B-12 (CYANOCOBALAMIN) 100 MCG tablet Take 100 mcg by mouth daily.    . Vitamin D, Cholecalciferol, 1000 UNITS TABS Take by mouth.     No current facility-administered medications on file prior to  visit.    Allergies  Allergen Reactions  . Latex Hives    itching   Family History  Problem Relation Age of Onset  . Hypertension Mother   . Diabetes Mother   . Thyroid disease Mother   . Diabetes Father   . Cancer Father        Colon cancer  . Diabetes Sister    PE: BP 120/88 (BP Location: Left Arm, Patient Position: Sitting)   Pulse 82   Wt 188 lb (85.3 kg)   SpO2 95%   BMI 33.84 kg/m  Body mass index is 33.84 kg/m. Wt Readings from Last 3 Encounters:  04/23/17 188 lb (85.3 kg)  01/29/17  189 lb 6.4 oz (85.9 kg)  11/21/16 180 lb 9.6 oz (81.9 kg)   Constitutional: overweight, in NAD Eyes: PERRLA, EOMI, no exophthalmos ENT: moist mucous membranes, no thyromegaly, no cervical lymphadenopathy Cardiovascular: RRR, No MRG Respiratory: CTA B Gastrointestinal: abdomen soft, NT, ND, BS+ Musculoskeletal: no deformities, strength intact in all 4 Skin: moist, warm, no rashes Neurological: no tremor with outstretched hands, DTR normal in all 4  ASSESSMENT: 1. DM2, insulin-dependent, uncontrolled, with complications - DR  2. Obesity class 1 BMI Classification:  < 18.5 underweight   18.5-24.9 normal weight   25.0-29.9 overweight   30.0-34.9 class I obesity   35.0-39.9 class II obesity   ? 40.0 class III obesity   PLAN:  1. Patient with long-standing, uncontrolled DM, on long acting insulin, GLP1 R agonist and Po medication. At last visit, we added back Jardiance as her DM control deteriorated after she stopped this. She tells me she did not start Jardiance   As this was too expensive.  As her sugars are higher than before and the increase throughout the day, we need better mealtime coverage.  Therefore, I suggested that she started NovoLog before each meal.   - We also discussed about improving her snacks to be more healthier.  She cannot tolerate nuts due to stomach irritation. - I suggested to:  Patient Instructions  Please continue: - Basaglar 40 units at  bedtime - Victoza 1.8 mg in am, before b'fast - Glipizide 10 mg 2x a day, before breakfast and before dinner  Please start Novolog 15 min before a meal - 8 units before a smaller meal - 10 units before a regular meal - 12 units before a larger meal  Please return in 3 months with your sugar log.   - today, HbA1c is 9.4%  (higher) - continue checking sugars at different times of the day - check 1-2x a day, rotating checks - advised for yearly eye exams >> she is UTD  - refuses flu shot today - Return to clinic in 3 mo with sugar log    2. Obesity  - she did not lose weight since last visit - unfortunately, we need to add NovoLog Which may increase her weight even more - Diet will be key we discussed about healthier changes in her diet  Philemon Kingdom, MD PhD [facility]

## 2017-04-23 NOTE — Patient Instructions (Addendum)
Please continue: - Basaglar 40 units at bedtime - Victoza 1.8 mg in am, before b'fast - Glipizide 10 mg 2x a day, before breakfast and before dinner  Please start Novolog 15 min before a meal - 8 units before a smaller meal - 10 units before a regular meal - 12 units before a larger meal  Please return in 3 months with your sugar log.

## 2017-04-27 NOTE — Telephone Encounter (Signed)
jardiance 25 mg is approved according to the pt

## 2017-04-28 ENCOUNTER — Telehealth: Payer: Self-pay

## 2017-04-28 ENCOUNTER — Other Ambulatory Visit: Payer: Self-pay

## 2017-04-28 ENCOUNTER — Telehealth: Payer: Self-pay | Admitting: Internal Medicine

## 2017-04-28 MED ORDER — EMPAGLIFLOZIN 25 MG PO TABS: 25.0000 mg | ORAL_TABLET | Freq: Every day | ORAL | 2 refills | Status: DC

## 2017-04-28 NOTE — Telephone Encounter (Signed)
Do we need to send Jardiance in for patient? Please advise. Thank you!

## 2017-04-28 NOTE — Telephone Encounter (Signed)
Great, yes, let's send this >> she may be able to decrease Novolog or even stop if starts this and sugars improve

## 2017-04-28 NOTE — Telephone Encounter (Signed)
Called and LVM advising that I had sent in for Jardiance. Advised to let us know how sugars are doing. Left call back number if any questions,  

## 2017-04-28 NOTE — Telephone Encounter (Signed)
Re: Jardian-please prescription for Kristen LeitzJardian to [facility]

## 2017-04-28 NOTE — Telephone Encounter (Signed)
Submitted

## 2017-04-28 NOTE — Telephone Encounter (Signed)
Called and LVM advising that I had sent in for Jardiance. Advised to let us know how sugars are doing. Left call back number if any questions,

## 2017-05-15 ENCOUNTER — Other Ambulatory Visit: Payer: Self-pay | Admitting: Internal Medicine

## 2017-05-15 NOTE — Telephone Encounter (Signed)
Need a refill  For Glipizide,   send to [facility] [place] - [phone] [place]  Send all medication [facility] [place] - [phone] [place]

## 2017-05-17 NOTE — Addendum Note (Signed)
Addended by: Sherren MochaSHAW, Sonu Kruckenberg N on: 05/17/2017 11:43 PM   Modules accepted: Orders

## 2017-06-24 ENCOUNTER — Other Ambulatory Visit: Payer: Self-pay | Admitting: Internal Medicine

## 2017-07-14 ENCOUNTER — Other Ambulatory Visit: Payer: Self-pay | Admitting: Family Medicine

## 2017-07-14 DIAGNOSIS — Z139 Encounter for screening, unspecified: Secondary | ICD-10-CM

## 2017-07-16 ENCOUNTER — Ambulatory Visit: Payer: Commercial Managed Care - HMO

## 2017-07-24 ENCOUNTER — Encounter: Payer: Self-pay | Admitting: Internal Medicine

## 2017-07-24 ENCOUNTER — Ambulatory Visit: Payer: Commercial Managed Care - HMO | Admitting: Internal Medicine

## 2017-07-24 VITALS — BP 132/84 | HR 78 | Ht 62.5 in | Wt 185.0 lb

## 2017-07-24 DIAGNOSIS — E11319 Type 2 diabetes mellitus with unspecified diabetic retinopathy without macular edema: Secondary | ICD-10-CM

## 2017-07-24 DIAGNOSIS — Z794 Long term (current) use of insulin: Secondary | ICD-10-CM

## 2017-07-24 DIAGNOSIS — E669 Obesity, unspecified: Secondary | ICD-10-CM

## 2017-07-24 LAB — POCT GLYCOSYLATED HEMOGLOBIN (HGB A1C): Hemoglobin A1C: 8.4

## 2017-07-24 MED ORDER — BASAGLAR KWIKPEN 100 UNIT/ML ~~LOC~~ SOPN: 30.0000 [IU] | PEN_INJECTOR | Freq: Every day | SUBCUTANEOUS | 2 refills | Status: DC

## 2017-07-24 MED ORDER — INSULIN LISPRO 200 UNIT/ML ~~LOC~~ SOPN: 20.0000 [IU] | PEN_INJECTOR | Freq: Two times a day (BID) | SUBCUTANEOUS | 11 refills | Status: DC

## 2017-07-24 NOTE — Patient Instructions (Addendum)
Please change: - Basaglar to 30 units in am - Humalog 20 units in am but increase to 25 before dinner - Victoza 1.8 mg in am, before b'fast - Jardiance 25 mg but move it before b'fast  Stop Glipizide.  Please return in 3 months with your sugar log.

## 2017-07-24 NOTE — Progress Notes (Signed)
Patient ID: Kristen [name], female   DOB: 1951/03/27, 67 y.o.   MRN: [id number]  HPI: Kristen [name] is a 67 y.o.-year-old female, returning for f/u for DM2, dx in 2000, insulin-dependent since 2012, uncontrolled, with complications (DR). Last visit 3 months ago.  Last hemoglobin A1c was: Lab Results  Component Value Date   HGBA1C 9.4 04/23/2017   HGBA1C 8.7 11/21/2016   HGBA1C 8.5 08/22/2016   She is on: - Basaglar 40 >> 35 units at bedtime >> am - Humalog 15 min before a meal  But taking 20 units 2x a day before meals - Victoza 1.8 mg in am, before b'fast - Glipizide 10 mg 2x a day, before breakfast  - Jardiance 25 mg before dinner She was on Jardiance 25 mg daily before b'fast. - added 03/2016 - stopped 06/2016  >> sugars increased >> at last visit, I advised her to restart >> did not do so as this is expensive. She tried Metformin ER 500 mg >> still N/D/AP. She was on Humalog before >> made her arms numb  Pt checks her sugars twice a day: - am:  138-159 >> 105-140 >> 133-148 >> 157-160 >> 61, 78, 82-145 - 2h after b'fast: n/c >> 180-203 >> n/c >> 152 - before lunch: n/c  - 2h after lunch: n/c - before dinner:  210-220 >> 265-285  >> n/c >> 192 - 2h after dinner: 280-295 >> n/c  - bedtime: 160s >> 180 >> n/c >> 219-306 >> 228, 293 - nighttime: 65 >> n/c Lowest 105 >> 137 >> 61; she has hypoglycemia awareness a in the 70s. Highest sugar was 285 >> 300s >> 293  Glucometer: AccuChek  Pt's meals are - smaller meals since 04/2015: - Breakfast: yoghurt (stopped eggs); oatmeal + walnuts + raisins >> boiled egg, protein shakes - Snack: fruit  - Lunch: meat + salad + apple or apple sauce - Snack: PB and peanuts - Dinner: meat + veggies - Snacks: 2 - tuna fish + 6 crackers; peanuts; protein shakes  Still walking -1 mile a day.   -No CKD, last BUN/creatinine:  Lab Results  Component Value Date   BUN 15 01/29/2017   CREATININE 0.62 01/29/2017  On losartan. - + HL; last set of  lipids: Lab Results  Component Value Date   CHOL 160 01/29/2017   HDL 47 01/29/2017   LDLCALC 81 01/29/2017   TRIG 161 (H) 01/29/2017   CHOLHDL 3.4 01/29/2017   - last eye exam was in 05/2017: + DR. + Glaucoma. Dr. Phineas Real ([place].) -She denies numbness and tingling in her feet.  She also has a history of GERD, HTN.  She is on a liver support supplement containing milk thistle.  Bowel movements more regular.  ROS: Constitutional: no weight gain/no weight loss, no fatigue, no subjective hyperthermia, no subjective hypothermia Eyes: no blurry vision, no xerophthalmia ENT: no sore throat, no nodules palpated in throat, no dysphagia, no odynophagia, no hoarseness Cardiovascular: no CP/no SOB/no palpitations/no leg swelling Respiratory: no cough/no SOB/no wheezing Gastrointestinal: no N/no V/no D/no C/no acid reflux Musculoskeletal: no muscle aches/no joint aches Skin: no rashes, no hair loss Neurological: no tremors/no numbness/no tingling/no dizziness  I reviewed pt's medications, allergies, PMH, social hx, family hx, and changes were documented in the history of present illness. Otherwise, unchanged from my initial visit note.  Past Medical History:  Diagnosis Date  . Diabetes mellitus   . Glaucoma   . HTN (hypertension)   . Meralgia paresthetica  Past Surgical History:  Procedure Laterality Date  . BREAST CYST EXCISION    . CHOLECYSTECTOMY  06/16/2008  . TONSILLECTOMY AND ADENOIDECTOMY     Social History   Social History  . Marital Status: Widowed    Spouse Name: N/A  . Number of Children: 1   Occupational History  .    Social History Main Topics  . Smoking status: Never Smoker   . Smokeless tobacco: Not on file  . Alcohol Use: No  . Drug Use: No   Current Outpatient Medications on File Prior to Visit  Medication Sig Dispense Refill  . albuterol (PROVENTIL HFA;VENTOLIN HFA) 108 (90 Base) MCG/ACT inhaler Inhale 1-2 puffs into the lungs every 4  (four) hours as needed for wheezing or shortness of breath. 1 Inhaler 0  . Ascorbic Acid (VITAMIN C) 1000 MG tablet Take 1,000 mg by mouth daily.    . Blood Glucose Monitoring Suppl (BLOOD GLUCOSE METER KIT AND SUPPLIES) KIT Test blood sugar as directed. 1 each 0  . empagliflozin (JARDIANCE) 25 MG TABS tablet Take 25 mg daily by mouth. 90 tablet 2  . glipiZIDE (GLUCOTROL) 5 MG tablet TAKE 2 TABLETS BY MOUTH  TWICE A DAY BEFORE MEALS 240 tablet 0  . glucose blood (ACCU-CHEK AVIVA) test strip Use as instructed to check sugar 2 times daily 200 each 5  . glucose blood (PRODIGY NO CODING BLOOD GLUC) test strip Use as instructed to check 2 times daily 200 each 5  . insulin aspart (NOVOLOG FLEXPEN) 100 UNIT/ML FlexPen Inject 8-12 Units 3 (three) times daily before meals into the skin. 30 mL 3  . Insulin Glargine (BASAGLAR KWIKPEN) 100 UNIT/ML SOPN Inject 0.4 mLs (40 Units total) into the skin at bedtime. 15 pen 2  . Insulin Pen Needle (PEN NEEDLES) 31G X 6 MM MISC 2 each by Does not apply route daily. 100 each 11  . Lancets MISC Use as directed. Lancets for meter, which ever insurance prefers 100 each 2  . liraglutide (VICTOZA) 18 MG/3ML SOPN Inject 0.3 mLs (1.8 mg total) into the skin daily before breakfast. 9 pen 2  . losartan-hydrochlorothiazide (HYZAAR) 100-25 MG tablet Take 1 tablet by mouth daily. 90 tablet 3  . magnesium 30 MG tablet Take 30 mg by mouth 2 (two) times daily.    . Multiple Vitamin (MULTIVITAMIN) tablet Take 1 tablet by mouth daily.    . sucralfate (CARAFATE) 1 g tablet 1 tab 1 hr ac and hs 120 tablet 0  . vitamin B-12 (CYANOCOBALAMIN) 100 MCG tablet Take 100 mcg by mouth daily.    . Vitamin D, Cholecalciferol, 1000 UNITS TABS Take 2,000 Units by mouth daily. otc     No current facility-administered medications on file prior to visit.    Allergies  Allergen Reactions  . Latex Hives    itching   Family History  Problem Relation Age of Onset  . Hypertension Mother   .  Diabetes Mother   . Thyroid disease Mother   . Diabetes Father   . Cancer Father        Colon cancer  . Diabetes Sister    PE: BP 132/84   Pulse 78   Ht 5' 2.5" (1.588 m)   Wt 185 lb (83.9 kg)   SpO2 95%   BMI 33.30 kg/m  There is no height or weight on file to calculate BMI. Wt Readings from Last 3 Encounters:  07/24/17 185 lb (83.9 kg)  04/23/17 188 lb (85.3 kg)  01/29/17  189 lb 6.4 oz (85.9 kg)   Constitutional: overweight, in NAD Eyes: PERRLA, EOMI, no exophthalmos ENT: moist mucous membranes, no thyromegaly, no cervical lymphadenopathy Cardiovascular: RRR, No MRG Respiratory: CTA B Gastrointestinal: abdomen soft, NT, ND, BS+ Musculoskeletal: no deformities, strength intact in all 4 Skin: moist, warm, no rashes Neurological: no tremor with outstretched hands, DTR normal in all 4  ASSESSMENT: 1. DM2, insulin-dependent, uncontrolled, with complications - DR  2. Obesity class 1 BMI Classification:  < 18.5 underweight   18.5-24.9 normal weight   25.0-29.9 overweight   30.0-34.9 class I obesity   35.0-39.9 class II obesity   ? 40.0 class III obesity   PLAN:  1. Patient with long-standing, uncontrolled, diabetes, on long-acting insulin, GLP-1 receptor agonist, glipizide, and now NovoLog, added at last visit.  We tried to add Jardiance in the past but this was too expensive and she could not afford it >> however, she started it in the new year.  At last visit, we also discussed about improving her snacks to be more healthier. Since last visit, she tried keto diet >> sugars were too low. She could not do w/o fruit and starchy veggies. - sugars at this visit are at goal or close to goal in am, but high at bedtime >> will increase insulin with dinner and also decrease basal insulin. Will move Jardiance in am and try to stop Glipizide. - I suggested to:  Patient Instructions  Please change: - Basaglar to 30 units in am - Humalog 20 units in am but increase to 25  before dinner - Victoza 1.8 mg in am, before b'fast - Jardiance 25 mg but move it before b'fast  Stop Glipizide.  Please return in 2-3 months with your sugar log.   - today, HbA1c is 8.4% (lower) - continue checking sugars at different times of the day - check 3x a day, rotating checks - advised for yearly eye exams >> she is UTD - Return to clinic in 3 mo with sugar log     2. Obesity  - She did not lose a significant amount of weight since last visit - again discussed about improving diet >> suggested a whole food plant-based diet rather than a keto diet - Jardiance should help  Philemon Kingdom, MD PhD [facility] Endocrinology

## 2017-07-30 ENCOUNTER — Encounter: Payer: Commercial Managed Care - HMO | Admitting: Family Medicine

## 2017-08-06 ENCOUNTER — Other Ambulatory Visit: Payer: Self-pay | Admitting: Family Medicine

## 2017-08-06 ENCOUNTER — Ambulatory Visit
Admission: RE | Admit: 2017-08-06 | Discharge: 2017-08-06 | Disposition: A | Discharge status: Home / Self Care | Payer: Commercial Managed Care - HMO | Source: Ambulatory Visit | Attending: Family Medicine | Admitting: Family Medicine

## 2017-08-06 DIAGNOSIS — Z139 Encounter for screening, unspecified: Secondary | ICD-10-CM

## 2017-08-13 ENCOUNTER — Encounter: Payer: 59 | Admitting: Family Medicine

## 2017-08-19 NOTE — Progress Notes (Signed)
This encounter was created in error - please disregard.

## 2017-09-01 ENCOUNTER — Ambulatory Visit (INDEPENDENT_AMBULATORY_CARE_PROVIDER_SITE_OTHER): Payer: 59 | Admitting: Physician Assistant

## 2017-09-01 ENCOUNTER — Encounter: Payer: Self-pay | Admitting: Physician Assistant

## 2017-09-01 VITALS — BP 124/74 | HR 69 | Temp 97.7°F | Resp 16 | Ht 62.5 in | Wt 179.4 lb

## 2017-09-01 DIAGNOSIS — Z0000 Encounter for general adult medical examination without abnormal findings: Secondary | ICD-10-CM

## 2017-09-01 DIAGNOSIS — Z794 Long term (current) use of insulin: Secondary | ICD-10-CM | POA: Diagnosis not present

## 2017-09-01 DIAGNOSIS — I10 Essential (primary) hypertension: Secondary | ICD-10-CM

## 2017-09-01 DIAGNOSIS — E11319 Type 2 diabetes mellitus with unspecified diabetic retinopathy without macular edema: Secondary | ICD-10-CM

## 2017-09-01 LAB — COMPREHENSIVE METABOLIC PANEL
ALT: 33 IU/L — ABNORMAL HIGH (ref 0–32)
AST: 25 IU/L (ref 0–40)
Albumin/Globulin Ratio: 1.9 (ref 1.2–2.2)
Albumin: 4.3 g/dL (ref 3.6–4.8)
Alkaline Phosphatase: 79 IU/L (ref 39–117)
BUN/Creatinine Ratio: 20 (ref 12–28)
BUN: 14 mg/dL (ref 8–27)
Bilirubin Total: 0.2 mg/dL (ref 0.0–1.2)
CO2: 20 mmol/L (ref 20–29)
Calcium: 9.3 mg/dL (ref 8.7–10.3)
Chloride: 104 mmol/L (ref 96–106)
Creatinine, Ser: 0.7 mg/dL (ref 0.57–1.00)
GFR calc Af Amer: 104 mL/min/{1.73_m2} (ref >59)
GFR calc non Af Amer: 91 mL/min/{1.73_m2} (ref >59)
Globulin, Total: 2.3 g/dL (ref 1.5–4.5)
Glucose: 185 mg/dL — ABNORMAL HIGH (ref 65–99)
Potassium: 4.3 mmol/L (ref 3.5–5.2)
Sodium: 141 mmol/L (ref 134–144)
Total Protein: 6.6 g/dL (ref 6.0–8.5)

## 2017-09-01 LAB — LIPID PANEL
Chol/HDL Ratio: 3.3 ratio (ref 0.0–4.4)
Cholesterol, Total: 135 mg/dL (ref 100–199)
HDL: 41 mg/dL
LDL Calculated: 79 mg/dL (ref 0–99)
Triglycerides: 77 mg/dL (ref 0–149)
VLDL Cholesterol Cal: 15 mg/dL (ref 5–40)

## 2017-09-01 NOTE — Patient Instructions (Addendum)
Being Mortal by Eda Keys, MD    IF you received an x-ray today, you will receive an invoice from [facility]. Please contact [facility] at [phone] with questions or concerns regarding your invoice.   IF you received labwork today, you will receive an invoice from [facility]. Please contact [facility] at [phone] with questions or concerns regarding your invoice.   Our billing staff will not be able to assist you with questions regarding bills from these companies.  You will be contacted with the lab results as soon as they are available. The fastest way to get your results is to activate your My Chart account. Instructions are located on the last page of this paperwork. If you have not heard from [place] regarding the results in 2 weeks, please contact this office.     . Preventive Care 68 Years and Older, Female Preventive care refers to lifestyle choices and visits with your health care provider that can promote health and wellness. What does preventive care include?  A yearly physical exam. This is also called an annual well check.  Dental exams once or twice a year.  Routine eye exams. Ask your health care provider how often you should have your eyes checked.  Personal lifestyle choices, including: ? Daily care of your teeth and gums. ? Regular physical activity. ? Eating a healthy diet. ? Avoiding tobacco and drug use. ? Limiting alcohol use. ? Practicing safe sex. ? Taking low-dose aspirin every day. ? Taking vitamin and mineral supplements as recommended by your health care provider. What happens during an annual well check? The services and screenings done by your health care provider during your annual well check will depend on your age, overall health, lifestyle risk factors, and family history of disease. Counseling Your health care provider may ask you questions about your:  Alcohol use.  Tobacco use.  Drug use.  Emotional  well-being.  Home and relationship well-being.  Sexual activity.  Eating habits.  History of falls.  Memory and ability to understand (cognition).  Work and work Statistician.  Reproductive health.  Screening You may have the following tests or measurements:  Height, weight, and BMI.  Blood pressure.  Lipid and cholesterol levels. These may be checked every 5 years, or more frequently if you are over 63 years old.  Skin check.  Lung cancer screening. You may have this screening every year starting at age 10 if you have a 30-pack-year history of smoking and currently smoke or have quit within the past 15 years.  Fecal occult blood test (FOBT) of the stool. You may have this test every year starting at age 68.  Flexible sigmoidoscopy or colonoscopy. You may have a sigmoidoscopy every 5 years or a colonoscopy every 10 years starting at age 72.  Hepatitis C blood test.  Hepatitis B blood test.  Sexually transmitted disease (STD) testing.  Diabetes screening. This is done by checking your blood sugar (glucose) after you have not eaten for a while (fasting). You may have this done every 1-3 years.  Bone density scan. This is done to screen for osteoporosis. You may have this done starting at age 81.  Mammogram. This may be done every 1-2 years. Talk to your health care provider about how often you should have regular mammograms.  Talk with your health care provider about your test results, treatment options, and if necessary, the need for more tests. Vaccines Your health care provider may recommend certain vaccines, such as:  Influenza vaccine.  This is recommended every year.  Tetanus, diphtheria, and acellular pertussis (Tdap, Td) vaccine. You may need a Td booster every 10 years.  Varicella vaccine. You may need this if you have not been vaccinated.  Zoster vaccine. You may need this after age 67.  Measles, mumps, and rubella (MMR) vaccine. You may need at least  one dose of MMR if you were born in 1957 or later. You may also need a second dose.  Pneumococcal 13-valent conjugate (PCV13) vaccine. One dose is recommended after age 30.  Pneumococcal polysaccharide (PPSV23) vaccine. One dose is recommended after age 23.  Meningococcal vaccine. You may need this if you have certain conditions.  Hepatitis A vaccine. You may need this if you have certain conditions or if you travel or work in places where you may be exposed to hepatitis A.  Hepatitis B vaccine. You may need this if you have certain conditions or if you travel or work in places where you may be exposed to hepatitis B.  Haemophilus influenzae type b (Hib) vaccine. You may need this if you have certain conditions.  Talk to your health care provider about which screenings and vaccines you need and how often you need them. This information is not intended to replace advice given to you by your health care provider. Make sure you discuss any questions you have with your health care provider. Document Released: 06/29/2015 Document Revised: 02/20/2016 Document Reviewed: 04/03/2015 Elsevier Interactive Patient Education  [employer].

## 2017-09-01 NOTE — Progress Notes (Signed)
Presents today for Welcome to Medicare Visit.   Interpreter used for this visit? no  Patient Care Team: Shawnee Knapp, MD as PCP - General (Family Medicine) Philemon Kingdom, MD as Consulting Physician (Internal Medicine)  Cervical Cancer Screening: last pap estimated 3-4 years ago, but no documentation found in the EMR (07/15/2011) Breast Cancer Screening: 05/26/16 Colorectal Cancer Screening: 05/2011. Has a Cologuard at her home, but hasn't sent it in yet. Bone Density Testing: not yet  HIV Screening: no longer a candidate Seasonal Influenza Vaccination: Declined Td/Tdap Vaccination: 09/08/12 Pneumococcal Vaccination: declined Zoster Vaccination: has not received this, but did have shingles about 20 years ago (after her husband's death). Frequency of Dental evaluation: only 2 teeth left, wears dentures, upper and lower plates. Frequency of Eye evaluation: Q6 months  Other items to address today: none   Cancer Screening: Cervical: No documented cervical cancer screening in this record.  Patient reports it has been 3 to 4 years. Breast: yes Colon: no  Prostate: n/a   Other Screening: Last screening for diabetes: has diabetes, sees Dr. Cruzita Lederer, A1C 07/24/2017 8.3% Last lipid screening: 01/29/2017: LDL 81, TG 161   ADVANCE DIRECTIVES: Discussed: yes On File: no Materials Provided: no, declined   Immunization status:  Immunization History  Administered Date(s) Administered  . Tdap 09/08/2012     Health Maintenance Due  Topic Date Due  . Fecal DNA (Cologuard)  06/14/2001  . DEXA SCAN  06/14/2016      Patient Active Problem List   Diagnosis Date Noted  . Class 1 obesity 04/23/2017  . Vitamin D deficiency 01/20/2016  . Type 2 diabetes mellitus with retinopathy, with long-term current use of insulin ([place]) 10/30/2015  . Diabetic retinopathy ([place]) 02/03/2012  . HTN (hypertension) 01/12/2012  . Obesity, Class II, BMI 35-39.9, with comorbidity 01/12/2012  .  Meralgia paresthetica      Past Medical History:  Diagnosis Date  . Diabetes mellitus   . Glaucoma   . HTN (hypertension)   . Meralgia paresthetica      Past Surgical History:  Procedure Laterality Date  . BREAST CYST EXCISION    . CHOLECYSTECTOMY  06/16/2008  . TONSILLECTOMY AND ADENOIDECTOMY       Family History  Problem Relation Age of Onset  . Hypertension Mother   . Diabetes Mother   . Thyroid disease Mother   . Diabetes Father   . Cancer Father        Colon cancer  . Diabetes Sister      Social History   Socioeconomic History  . Marital status: Widowed    Spouse name: Not on file  . Number of children: 4  . Years of education: Not on file  . Highest education level: Some college, no degree  Social Needs  . Financial resource strain: Not hard at all  . Food insecurity - worry: Never true  . Food insecurity - inability: Never true  . Transportation needs - medical: No  . Transportation needs - non-medical: No  Occupational History  . 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 Smoking status: Never Smoker  . Smokeless tobacco: Never Used  Substance and Sexual Activity  . Alcohol use: No  . Drug use: No  . Sexual activity: Yes    Birth control/protection: None    Comment: number of sex partners in the last 12 months  0  Other Topics Concern  .  Not on file  Social History Narrative   Lives alone, though her son stays with her as needed.   4 adult children, 1 biological, 3 adopted.    Allergies  Allergen Reactions  . Latex Hives    itching     Prior to Admission medications   Medication Sig Start Date End Date Taking? Authorizing Provider  albuterol (PROVENTIL HFA;VENTOLIN HFA) 108 (90 Base) MCG/ACT inhaler Inhale 1-2 puffs into the lungs every 4 (four) hours as needed for wheezing or shortness of breath. 09/22/16   Wendie Agreste, MD  Ascorbic Acid (VITAMIN C) 1000 MG tablet Take  1,000 mg by mouth daily.    [provider]  Blood Glucose Monitoring Suppl (BLOOD GLUCOSE METER KIT AND SUPPLIES) KIT Test blood sugar as directed. 11/21/13   Barton Fanny, MD  empagliflozin (JARDIANCE) 25 MG TABS tablet Take 25 mg daily by mouth. 04/28/17   Philemon Kingdom, MD  glucose blood (ACCU-CHEK AVIVA) test strip Use as instructed to check sugar 2 times daily 01/29/17   Shawnee Knapp, MD  glucose blood (PRODIGY NO CODING BLOOD GLUC) test strip Use as instructed to check 2 times daily 01/29/17   Shawnee Knapp, MD  Insulin Glargine (BASAGLAR KWIKPEN) 100 UNIT/ML SOPN Inject 0.3 mLs (30 Units total) into the skin daily. 07/24/17   Philemon Kingdom, MD  Insulin Lispro (HUMALOG KWIKPEN) 200 UNIT/ML SOPN Inject 20-25 Units into the skin 2 (two) times daily before a meal. 07/24/17   Philemon Kingdom, MD  Insulin Pen Needle (PEN NEEDLES) 31G X 6 MM MISC 2 each by Does not apply route daily. 01/09/17   Philemon Kingdom, MD  Lancets MISC Use as directed. Lancets for meter, which ever insurance prefers 09/06/15   Elby Beck, FNP  liraglutide (VICTOZA) 18 MG/3ML SOPN Inject 0.3 mLs (1.8 mg total) into the skin daily before breakfast. 01/28/17   Philemon Kingdom, MD  losartan-hydrochlorothiazide (HYZAAR) 100-25 MG tablet Take 1 tablet by mouth daily. 01/29/17   Shawnee Knapp, MD  magnesium 30 MG tablet Take 30 mg by mouth 2 (two) times daily.    [provider]  Multiple Vitamin (MULTIVITAMIN) tablet Take 1 tablet by mouth daily.    [provider]  sucralfate (CARAFATE) 1 g tablet 1 tab 1 hr ac and hs 01/11/16   Jeffery, Chelle, PA-C  vitamin B-12 (CYANOCOBALAMIN) 100 MCG tablet Take 100 mcg by mouth daily.    [provider]  Vitamin D, Cholecalciferol, 1000 UNITS TABS Take 2,000 Units by mouth daily. otc    [provider]     Depression screen [facility] 2/9 01/29/2017 09/22/2016 01/16/2016 07/18/2015 04/16/2015  Decreased Interest 0 0 0 0 0  Down, Depressed,  Hopeless 0 0 0 0 0  PHQ - 2 Score 0 0 0 0 0     Fall Risk  01/29/2017 09/22/2016 01/16/2016 07/18/2015 04/16/2015  Falls in the past year? _0      Functional Status Survey:  Independent with ADLs and management of her home.   Home Environment: Lives alone.  Urinary Incontinence?: No   ELECTROCARDIOGRAM: Sinus rhythm with a rate of 57 bpm.  PR 194, QT 408.  Compared to tracing of 09/08/2012.  Previous EKG showed a short PR interval, rate was 70.    PHYSICAL EXAM: BP 124/74   Pulse 69   Temp 97.7 F (36.5 C)   Resp 16   Ht 5' 2.5" (1.588 m)   Wt 179 lb  6.4 oz (81.4 kg)   SpO2 99%   BMI 32.29 kg/m    Wt Readings from Last 3 Encounters:  09/01/17 179 lb 6.4 oz (81.4 kg)  07/24/17 185 lb (83.9 kg)  04/23/17 188 lb (85.3 kg)      Visual Acuity Screening   Right eye Left eye Both eyes  Without correction: 20/30 20/30 20/20  With correction:         Physical Exam  Constitutional: She is oriented to person, place, and time. She appears well-developed and well-nourished. She is active and cooperative. No distress.  HENT:  Head: Normocephalic and atraumatic.  Right Ear: Hearing, tympanic membrane, external ear and ear canal normal.  Left Ear: Hearing, tympanic membrane, external ear and ear canal normal.  Nose: Nose normal.  Mouth/Throat: Uvula is midline, oropharynx is clear and moist and mucous membranes are normal. No oral lesions. No uvula swelling. No oropharyngeal exudate.  Eyes: Pupils are equal, round, and reactive to light. Conjunctivae, EOM and lids are normal. Right eye exhibits no discharge. Left eye exhibits no discharge. No scleral icterus.  Fundoscopic exam:      The right eye shows no hemorrhage and no papilledema. The right eye shows red reflex.       The left eye shows no hemorrhage and no papilledema. The left eye shows red reflex.  Neck: Normal range of motion, full passive range of motion without pain and phonation normal. Neck supple. No  thyromegaly present.  Cardiovascular: Normal rate, regular rhythm, normal heart sounds and intact distal pulses. Exam reveals no gallop and no friction rub.  No murmur heard. Respiratory: Effort normal and breath sounds normal.  GI: Soft. Normal appearance and bowel sounds are normal. There is no hepatosplenomegaly. There is no tenderness.  Musculoskeletal:       Cervical back: Normal.       Thoracic back: Normal.       Lumbar back: Normal.  Lymphadenopathy:       Head (right side): No submandibular and no tonsillar adenopathy present.       Head (left side): No submandibular and no tonsillar adenopathy present.    She has no cervical adenopathy.       Right: No supraclavicular adenopathy present.       Left: No supraclavicular adenopathy present.  Neurological: She is alert and oriented to person, place, and time. She has normal strength. No cranial nerve deficit or sensory deficit.  Skin: Skin is warm and dry. No rash noted. She is not diaphoretic.  Psychiatric: She has a normal mood and affect. Her speech is normal and behavior is normal. Judgment and thought content normal. Cognition and memory are normal.     Education/Counseling provided on diet and exercise, prevention of chronic diseases, smoking/tobacco cessation. Reviewed "Covered Medicare Preventive Services."    ASSESSMENT/PLAN: Problem List Items Addressed This Visit    HTN (hypertension)    Well-controlled.  Continue losartan HCTZ.      Relevant Orders   Comprehensive metabolic panel (Completed)   EKG 12-Lead (Completed)   Type 2 diabetes mellitus with retinopathy, with long-term current use of insulin ([place])    Managed by endocrinology.      Relevant Orders   Comprehensive metabolic panel (Completed)   Lipid panel (Completed)    Other Visit Diagnoses    Welcome to Medicare preventive visit    -  Primary   Age-appropriate health guidance provided.   Relevant Orders   EKG 12-Lead (Completed)  Return  for CPE (breast and pap)  with Dr. Brigitte Pulse, at your convenience.   Fara Chute, PA-C [facility]

## 2017-09-04 ENCOUNTER — Other Ambulatory Visit: Payer: Self-pay | Admitting: Internal Medicine

## 2017-09-26 ENCOUNTER — Other Ambulatory Visit: Payer: Self-pay

## 2017-09-26 ENCOUNTER — Telehealth: Payer: Self-pay | Admitting: Family Medicine

## 2017-09-26 ENCOUNTER — Encounter: Payer: Self-pay | Admitting: Family Medicine

## 2017-09-26 ENCOUNTER — Ambulatory Visit (INDEPENDENT_AMBULATORY_CARE_PROVIDER_SITE_OTHER): Payer: 59 | Admitting: Family Medicine

## 2017-09-26 VITALS — BP 132/82 | HR 77 | Temp 98.0°F | Resp 16 | Ht 61.42 in | Wt 179.0 lb

## 2017-09-26 DIAGNOSIS — E2839 Other primary ovarian failure: Secondary | ICD-10-CM

## 2017-09-26 DIAGNOSIS — Z0000 Encounter for general adult medical examination without abnormal findings: Secondary | ICD-10-CM

## 2017-09-26 DIAGNOSIS — Z1389 Encounter for screening for other disorder: Secondary | ICD-10-CM

## 2017-09-26 DIAGNOSIS — Z130 Encounter for screening for diseases of the blood and blood-forming organs and certain disorders involving the immune mechanism: Secondary | ICD-10-CM

## 2017-09-26 DIAGNOSIS — Z124 Encounter for screening for malignant neoplasm of cervix: Secondary | ICD-10-CM

## 2017-09-26 DIAGNOSIS — Z136 Encounter for screening for cardiovascular disorders: Secondary | ICD-10-CM | POA: Diagnosis not present

## 2017-09-26 DIAGNOSIS — Z1329 Encounter for screening for other suspected endocrine disorder: Secondary | ICD-10-CM

## 2017-09-26 DIAGNOSIS — Z113 Encounter for screening for infections with a predominantly sexual mode of transmission: Secondary | ICD-10-CM | POA: Diagnosis not present

## 2017-09-26 DIAGNOSIS — Z1383 Encounter for screening for respiratory disorder NEC: Secondary | ICD-10-CM

## 2017-09-26 LAB — POCT URINALYSIS DIP (MANUAL ENTRY)
Bilirubin, UA: NEGATIVE
Blood, UA: NEGATIVE
Glucose, UA: NEGATIVE mg/dL
Ketones, POC UA: NEGATIVE mg/dL
Leukocytes, UA: NEGATIVE
Nitrite, UA: NEGATIVE
Protein Ur, POC: NEGATIVE mg/dL
Spec Grav, UA: 1.015 (ref 1.010–1.025)
Urobilinogen, UA: 0.2 E.U./dL
pH, UA: 6 (ref 5.0–8.0)

## 2017-09-26 NOTE — Progress Notes (Addendum)
Subjective:  By signing my name below, I, Moises Blood, attest that this documentation has been prepared under the direction and in the presence of Delman Cheadle, MD. Electronically Signed: Moises Blood, [place]. 09/26/2017 , 8:42 AM .  Patient was seen in Room 1 .   Patient ID: Kristen [name], female    DOB: [birth year], 67 y.o.   MRN: [id number] Chief Complaint  Patient presents with  . Annual Exam    pt states she needs a pap and breast exam    HPI Primary Preventative Screenings: Cervical Cancer: Had cervical biopsy in 2002 which showed benign nabothian cyst. Has not had pap smear in many years, will do last one today, last done 03/24/11 showed specimen was unsatisfactory for evaluation with too few cells for evaluation; prior to that was 07/07/2006 which was negative.  Breast Cancer: Has been doing mammogram every 2 years with last being 2 months prior at [facility], which was normal.  Colorectal Cancer: patient agreed to Colo-guard which was ordered in 01/2017 and not yet done. She didn't have Colo-guard done because she didn't know how to return the sample.  STI testing: declines as not sexually active.  Bone Density: No prior DEXA bone scan.  Cardiac: Weight/Blood sugar/Diet/Exercise: BMI Readings from Last 3 Encounters:  09/26/17 33.36 kg/m  09/01/17 32.29 kg/m  07/24/17 33.30 kg/m   Lab Results  Component Value Date   HGBA1C 8.4 07/24/2017   TSH: last done over 4 years ago.  OTC/Vit/Supp/Herbal: She is on B-12 supplement but no prior levels.  Dentist/Optho: Followed by Dr. Phineas Real at [facility] for diabetic retinopathy and glaucoma; last seen in Dec 2018, follow up in June 2019.  Hep C screening: Negative in 2009.  Immunizations:  Immunization History  Administered Date(s) Administered  . Tdap 09/08/2012   Due for Prevnar-13, but declined. But recommend discussion further at her next visit.   Chronic Medical Conditions: DM: Followed by Dr. Cruzita Lederer.  Patient has refused to start statin prior despite indication for diabetes. Micro albumin normal 01/2017. Hasn't been on metformin in 2 years.  Vitamin D deficiency: She takes 2000 units daily of Vitamin D.   Past Medical History:  Diagnosis Date  . Diabetes mellitus   . Glaucoma   . HTN (hypertension)   . Meralgia paresthetica    Past Surgical History:  Procedure Laterality Date  . BREAST CYST EXCISION    . CHOLECYSTECTOMY  06/16/2008  . TONSILLECTOMY AND ADENOIDECTOMY     Current Outpatient Medications on File Prior to Visit  Medication Sig Dispense Refill  . albuterol (PROVENTIL HFA;VENTOLIN HFA) 108 (90 Base) MCG/ACT inhaler Inhale 1-2 puffs into the lungs every 4 (four) hours as needed for wheezing or shortness of breath. 1 Inhaler 0  . Ascorbic Acid (VITAMIN C) 1000 MG tablet Take 1,000 mg by mouth daily.    . Blood Glucose Monitoring Suppl (BLOOD GLUCOSE METER KIT AND SUPPLIES) KIT Test blood sugar as directed. 1 each 0  . empagliflozin (JARDIANCE) 25 MG TABS tablet Take 25 mg daily by mouth. 90 tablet 2  . glucose blood (ACCU-CHEK AVIVA) test strip Use as instructed to check sugar 2 times daily 200 each 5  . glucose blood (PRODIGY NO CODING BLOOD GLUC) test strip Use as instructed to check 2 times daily 200 each 5  . Insulin Glargine (BASAGLAR KWIKPEN) 100 UNIT/ML SOPN INJECT SUBCUTANEOUSLY 40  UNITS AT BEDTIME 45 mL 1  . Insulin Lispro (HUMALOG KWIKPEN) 200 UNIT/ML SOPN Inject 20-25 Units  into the skin 2 (two) times daily before a meal. 5 pen 11  . Insulin Pen Needle (PEN NEEDLES) 31G X 6 MM MISC 2 each by Does not apply route daily. 100 each 11  . Lancets MISC Use as directed. Lancets for meter, which ever insurance prefers 100 each 2  . losartan-hydrochlorothiazide (HYZAAR) 100-25 MG tablet Take 1 tablet by mouth daily. 90 tablet 3  . magnesium 30 MG tablet Take 30 mg by mouth 2 (two) times daily.    . Multiple Vitamin (MULTIVITAMIN) tablet Take 1 tablet by mouth daily.    .  sucralfate (CARAFATE) 1 g tablet 1 tab 1 hr ac and hs 120 tablet 0  . vitamin B-12 (CYANOCOBALAMIN) 100 MCG tablet Take 100 mcg by mouth daily.    . Vitamin D, Cholecalciferol, 1000 UNITS TABS Take 2,000 Units by mouth daily. otc     No current facility-administered medications on file prior to visit.    Allergies  Allergen Reactions  . Latex Hives    itching   Family History  Problem Relation Age of Onset  . Hypertension Mother   . Diabetes Mother   . Thyroid disease Mother   . Diabetes Father   . Cancer Father        Colon cancer  . Diabetes Sister    Social History   Socioeconomic History  . Marital status: Widowed    Spouse name: Not on file  . Number of children: 4  . Years of education: Not on file  . Highest education level: Some college, no degree  Occupational History  . Occupation: Engineering geologist: [employer]    Comment: [place]  . Financial resource strain: Not hard at all  . Food insecurity:    Worry: Never true    Inability: Never true  . Transportation needs:    Medical: No    Non-medical: No  Tobacco Use  . Smoking status: Never Smoker  . Smokeless tobacco: Never Used  Substance and Sexual Activity  . Alcohol use: No  . Drug use: No  . Sexual activity: Yes    Birth control/protection: None    Comment: number of sex partners in the last 12 months  0  Lifestyle  . Physical activity:    Days per week: 3 days    Minutes per session: 90 min  . Stress: Not at all  Relationships  . Social connections:    Talks on phone: More than three times a week    Gets together: More than three times a week    Attends religious service: More than 4 times per year    Active member of club or organization: Yes    Attends meetings of clubs or organizations: More than 4 times per year    Relationship status: Widowed  Other Topics Concern  . Not on file  Social History Narrative   Lives alone, though her son stays with her  as needed.   4 adult children, 1 biological, 3 adopted.   Depression screen [facility] 2/9 09/26/2017 09/01/2017 01/29/2017 09/22/2016 01/16/2016  Decreased Interest 0 0 0 0 0  Down, Depressed, Hopeless 0 0 0 0 0  PHQ - 2 Score 0 0 0 0 0   Review of Systems  Constitutional: Negative for chills, fatigue, fever and unexpected weight change.  Respiratory: Negative for cough.   Gastrointestinal: Negative for constipation, diarrhea, nausea and vomiting.  Skin: Negative for rash and  wound.  Neurological: Negative for dizziness, weakness and headaches.       Objective:   Physical Exam  Constitutional: She is oriented to person, place, and time. She appears well-developed and well-nourished. No distress.  HENT:  Head: Normocephalic and atraumatic.  Right Ear: Tympanic membrane, external ear and ear canal normal.  Left Ear: Tympanic membrane, external ear and ear canal normal.  Nose: Mucosal edema present. No rhinorrhea.  Mouth/Throat: Uvula is midline, oropharynx is clear and moist and mucous membranes are normal. No posterior oropharyngeal erythema.  Eyes: Pupils are equal, round, and reactive to light. Conjunctivae and EOM are normal. Right eye exhibits no discharge. Left eye exhibits no discharge. No scleral icterus.  Neck: Normal range of motion. Neck supple. No thyromegaly present.  Cardiovascular: Normal rate, regular rhythm, normal heart sounds and intact distal pulses.  Pulmonary/Chest: Effort normal and breath sounds normal. No respiratory distress. No breast swelling, tenderness, discharge or bleeding.  Abdominal: Soft. Bowel sounds are normal. There is no tenderness.  Genitourinary: Vagina normal and uterus normal. No breast swelling, tenderness, discharge or bleeding. Cervix exhibits no motion tenderness and no friability. Right adnexum displays no mass and no tenderness. Left adnexum displays no mass and no tenderness.  Musculoskeletal: Normal range of motion. She exhibits no edema.    Lymphadenopathy:    She has no cervical adenopathy.  Neurological: She is alert and oriented to person, place, and time. She has normal reflexes.  Skin: Skin is warm and dry. She is not diaphoretic. No erythema.  Psychiatric: She has a normal mood and affect. Her behavior is normal.  Nursing note and vitals reviewed.   BP 132/82   Pulse 77   Temp 98 F (36.7 C) (Oral)   Resp 16   Ht 5' 1.42" (1.56 m)   Wt 179 lb (81.2 kg)   SpO2 96%   BMI 33.36 kg/m      Assessment & Plan:   1. Annual physical exam   2. Screening for cervical cancer   3. Routine screening for STI (sexually transmitted infection)   4. Estrogen deficiency   5. Screening for cardiovascular, respiratory, and genitourinary diseases   6. Screening for deficiency anemia   7. Screening for thyroid disorder     Orders Placed This Encounter  Procedures  . DG Bone Density    [employer]//Epic ORDER     Standing Status:   Future    Standing Expiration Date:   11/27/2018    Order Specific Question:   Reason for Exam (SYMPTOM  OR DIAGNOSIS REQUIRED)    Answer:   estrogen deficiency    Order Specific Question:   Preferred imaging location?    Answer:   [facility]  . CBC with Differential/Platelet  . TSH  . POCT urinalysis dipstick   I personally performed the services described in this documentation, which was scribed in my presence. The recorded information has been reviewed and considered, and addended by me as needed.   Delman Cheadle, M.D.  Primary Care at [facility] [address] [place], [place] [address] [phone] phone [phone] fax  03/21/18 9:34 PM

## 2017-09-26 NOTE — Telephone Encounter (Signed)
Called pt. To inform her that the prescription for her insulin she requested is filled by her diabetics doctor Carlus Pavlovristina Gherghe. I was unable to contact her and inform her of this. If the pt. Calls again please let her know that she needs to call Dr. Charlean SanfilippoGherghe's office and have them [facility] the [facility] to [facility].

## 2017-09-26 NOTE — Patient Instructions (Addendum)
You need to schedule your mammogram and your DEXA bone scan in next Febuary.  Please call [place] at [phone] to schedule.     IF you received an x-ray today, you will receive an invoice from [facility]. Please contact [facility] at [phone] with questions or concerns regarding your invoice.   IF you received labwork today, you will receive an invoice from [facility]. Please contact [facility] at [phone] with questions or concerns regarding your invoice.   Our billing staff will not be able to assist you with questions regarding bills from these companies.  You will be contacted with the lab results as soon as they are available. The fastest way to get your results is to activate your My Chart account. Instructions are located on the last page of this paperwork. If you have not heard from [place] regarding the results in 2 weeks, please contact this office.      Health Maintenance for Postmenopausal Women Menopause is a normal process in which your reproductive ability comes to an end. This process happens gradually over a span of months to years, usually between the ages of 66 and 57. Menopause is complete when you have missed 12 consecutive menstrual periods. It is important to talk with your health care provider about some of the most common conditions that affect postmenopausal women, such as heart disease, cancer, and bone loss (osteoporosis). Adopting a healthy lifestyle and getting preventive care can help to promote your health and wellness. Those actions can also lower your chances of developing some of these common conditions. What should I know about menopause? During menopause, you may experience a number of symptoms, such as:  Moderate-to-severe hot flashes.  Night sweats.  Decrease in sex drive.  Mood swings.  Headaches.  Tiredness.  Irritability.  Memory problems.  Insomnia.  Choosing to treat or not to  treat menopausal changes is an individual decision that you make with your health care provider. What should I know about hormone replacement therapy and supplements? Hormone therapy products are effective for treating symptoms that are associated with menopause, such as hot flashes and night sweats. Hormone replacement carries certain risks, especially as you become older. If you are thinking about using estrogen or estrogen with progestin treatments, discuss the benefits and risks with your health care provider. What should I know about heart disease and stroke? Heart disease, heart attack, and stroke become more likely as you age. This may be due, in part, to the hormonal changes that your body experiences during menopause. These can affect how your body processes dietary fats, triglycerides, and cholesterol. Heart attack and stroke are both medical emergencies. There are many things that you can do to help prevent heart disease and stroke:  Have your blood pressure checked at least every 1-2 years. High blood pressure causes heart disease and increases the risk of stroke.  If you are 23-70 years old, ask your health care provider if you should take aspirin to prevent a heart attack or a stroke.  Do not use any tobacco products, including cigarettes, chewing tobacco, or electronic cigarettes. If you need help quitting, ask your health care provider.  It is important to eat a healthy diet and maintain a healthy weight. ? Be sure to include plenty of vegetables, fruits, low-fat dairy products, and lean protein. ? Avoid eating foods that are high in solid fats, added sugars, or salt (sodium).  Get regular exercise. This is one of the most important things  that you can do for your health. ? Try to exercise for at least 150 minutes each week. The type of exercise that you do should increase your heart rate and make you sweat. This is known as moderate-intensity exercise. ? Try to do strengthening  exercises at least twice each week. Do these in addition to the moderate-intensity exercise.  Know your numbers.Ask your health care provider to check your cholesterol and your blood glucose. Continue to have your blood tested as directed by your health care provider.  What should I know about cancer screening? There are several types of cancer. Take the following steps to reduce your risk and to catch any cancer development as early as possible. Breast Cancer  Practice breast self-awareness. ? This means understanding how your breasts normally appear and feel. ? It also means doing regular breast self-exams. Let your health care provider know about any changes, no matter how small.  If you are 31 or older, have a clinician do a breast exam (clinical breast exam or CBE) every year. Depending on your age, family history, and medical history, it may be recommended that you also have a yearly breast X-ray (mammogram).  If you have a family history of breast cancer, talk with your health care provider about genetic screening.  If you are at high risk for breast cancer, talk with your health care provider about having an MRI and a mammogram every year.  Breast cancer (BRCA) gene test is recommended for women who have family members with BRCA-related cancers. Results of the assessment will determine the need for genetic counseling and BRCA1 and for BRCA2 testing. BRCA-related cancers include these types: ? Breast. This occurs in males or females. ? Ovarian. ? Tubal. This may also be called fallopian tube cancer. ? Cancer of the abdominal or pelvic lining (peritoneal cancer). ? Prostate. ? Pancreatic.  Cervical, Uterine, and Ovarian Cancer Your health care provider may recommend that you be screened regularly for cancer of the pelvic organs. These include your ovaries, uterus, and vagina. This screening involves a pelvic exam, which includes checking for microscopic changes to the surface of  your cervix (Pap test).  For women ages 21-65, health care providers may recommend a pelvic exam and a Pap test every three years. For women ages 32-65, they may recommend the Pap test and pelvic exam, combined with testing for human papilloma virus (HPV), every five years. Some types of HPV increase your risk of cervical cancer. Testing for HPV may also be done on women of any age who have unclear Pap test results.  Other health care providers may not recommend any screening for nonpregnant women who are considered low risk for pelvic cancer and have no symptoms. Ask your health care provider if a screening pelvic exam is right for you.  If you have had past treatment for cervical cancer or a condition that could lead to cancer, you need Pap tests and screening for cancer for at least 20 years after your treatment. If Pap tests have been discontinued for you, your risk factors (such as having a new sexual partner) need to be reassessed to determine if you should start having screenings again. Some women have medical problems that increase the chance of getting cervical cancer. In these cases, your health care provider may recommend that you have screening and Pap tests more often.  If you have a family history of uterine cancer or ovarian cancer, talk with your health care provider about genetic screening.  If you have vaginal bleeding after reaching menopause, tell your health care provider.  There are currently no reliable tests available to screen for ovarian cancer.  Lung Cancer Lung cancer screening is recommended for adults 11-106 years old who are at high risk for lung cancer because of a history of smoking. A yearly low-dose CT scan of the lungs is recommended if you:  Currently smoke.  Have a history of at least 30 pack-years of smoking and you currently smoke or have quit within the past 15 years. A pack-year is smoking an average of one pack of cigarettes per day for one year.  Yearly  screening should:  Continue until it has been 15 years since you quit.  Stop if you develop a health problem that would prevent you from having lung cancer treatment.  Colorectal Cancer  This type of cancer can be detected and can often be prevented.  Routine colorectal cancer screening usually begins at age 42 and continues through age 64.  If you have risk factors for colon cancer, your health care provider may recommend that you be screened at an earlier age.  If you have a family history of colorectal cancer, talk with your health care provider about genetic screening.  Your health care provider may also recommend using home test kits to check for hidden blood in your stool.  A small camera at the end of a tube can be used to examine your colon directly (sigmoidoscopy or colonoscopy). This is done to check for the earliest forms of colorectal cancer.  Direct examination of the colon should be repeated every 5-10 years until age 58. However, if early forms of precancerous polyps or small growths are found or if you have a family history or genetic risk for colorectal cancer, you may need to be screened more often.  Skin Cancer  Check your skin from head to toe regularly.  Monitor any moles. Be sure to tell your health care provider: ? About any new moles or changes in moles, especially if there is a change in a mole's shape or color. ? If you have a mole that is larger than the size of a pencil eraser.  If any of your family members has a history of skin cancer, especially at a young age, talk with your health care provider about genetic screening.  Always use sunscreen. Apply sunscreen liberally and repeatedly throughout the day.  Whenever you are outside, protect yourself by wearing long sleeves, pants, a wide-brimmed hat, and sunglasses.  What should I know about osteoporosis? Osteoporosis is a condition in which bone destruction happens more quickly than new bone creation.  After menopause, you may be at an increased risk for osteoporosis. To help prevent osteoporosis or the bone fractures that can happen because of osteoporosis, the following is recommended:  If you are 10-10 years old, get at least 1,000 mg of calcium and at least 600 mg of vitamin D per day.  If you are older than age 7 but younger than age 47, get at least 1,200 mg of calcium and at least 600 mg of vitamin D per day.  If you are older than age 1, get at least 1,200 mg of calcium and at least 800 mg of vitamin D per day.  Smoking and excessive alcohol intake increase the risk of osteoporosis. Eat foods that are rich in calcium and vitamin D, and do weight-bearing exercises several times each week as directed by your health care provider. What should  I know about how menopause affects my mental health? Depression may occur at any age, but it is more common as you become older. Common symptoms of depression include:  Low or sad mood.  Changes in sleep patterns.  Changes in appetite or eating patterns.  Feeling an overall lack of motivation or enjoyment of activities that you previously enjoyed.  Frequent crying spells.  Talk with your health care provider if you think that you are experiencing depression. What should I know about immunizations? It is important that you get and maintain your immunizations. These include:  Tetanus, diphtheria, and pertussis (Tdap) booster vaccine.  Influenza every year before the flu season begins.  Pneumonia vaccine.  Shingles vaccine.  Your health care provider may also recommend other immunizations. This information is not intended to replace advice given to you by your health care provider. Make sure you discuss any questions you have with your health care provider. Document Released: 07/25/2005 Document Revised: 12/21/2015 Document Reviewed: 03/06/2015 Elsevier Interactive Patient Education  2018 [employer].

## 2017-09-27 LAB — TSH: TSH: 2.66 u[IU]/mL (ref 0.450–4.500)

## 2017-09-27 LAB — CBC WITH DIFFERENTIAL/PLATELET
Basophils Absolute: 0 10*3/uL (ref 0.0–0.2)
Basos: 1 %
EOS (ABSOLUTE): 0.1 10*3/uL (ref 0.0–0.4)
Eos: 2 %
Hematocrit: 44.4 % (ref 34.0–46.6)
Hemoglobin: 14.6 g/dL (ref 11.1–15.9)
Immature Grans (Abs): 0 10*3/uL (ref 0.0–0.1)
Immature Granulocytes: 0 %
Lymphocytes Absolute: 1.7 10*3/uL (ref 0.7–3.1)
Lymphs: 34 %
MCH: 30 pg (ref 26.6–33.0)
MCHC: 32.9 g/dL (ref 31.5–35.7)
MCV: 91 fL (ref 79–97)
Monocytes Absolute: 0.4 10*3/uL (ref 0.1–0.9)
Monocytes: 9 %
Neutrophils Absolute: 2.7 10*3/uL (ref 1.4–7.0)
Neutrophils: 54 %
Platelets: 183 10*3/uL (ref 150–379)
RBC: 4.86 x10E6/uL (ref 3.77–5.28)
RDW: 14 % (ref 12.3–15.4)
WBC: 5 10*3/uL (ref 3.4–10.8)

## 2017-09-30 LAB — PAP IG AND HPV HIGH-RISK
HPV, high-risk: NEGATIVE
PAP Smear Comment: 0

## 2017-10-09 ENCOUNTER — Telehealth: Payer: Self-pay | Admitting: Internal Medicine

## 2017-10-09 NOTE — Telephone Encounter (Signed)
Pt called stating she needs a Rx pt never received for Insulin Lispro (HUMALOG KWIKPEN) 200 UNIT/ML SOPN at all.   Pt needs a new meter accu check  [facility] [place] [facility] [id number]  Call pt @ [phone]. Thank you!

## 2017-10-11 NOTE — Assessment & Plan Note (Signed)
Managed by endocrinology.

## 2017-10-11 NOTE — Assessment & Plan Note (Signed)
Well-controlled.  Continue losartan/HCTZ. 

## 2017-10-12 MED ORDER — INSULIN LISPRO 200 UNIT/ML ~~LOC~~ SOPN: 20.0000 [IU] | PEN_INJECTOR | Freq: Two times a day (BID) | SUBCUTANEOUS | 11 refills | Status: DC

## 2017-10-12 MED ORDER — ACCU-CHEK GUIDE ME W/DEVICE KIT: 1.0000 | PACK | 0 refills | Status: DC

## 2017-10-12 NOTE — Telephone Encounter (Signed)
Called pt to inform Rx's sent as requested. No answer.  

## 2017-10-19 ENCOUNTER — Telehealth: Payer: Self-pay

## 2017-10-19 NOTE — Telephone Encounter (Signed)
Placed PA form for  Accu[employer]k Guide Me on MD desk.

## 2017-10-21 NOTE — Telephone Encounter (Signed)
Faxed PA to [facility]. Fax#[phone]

## 2017-10-22 ENCOUNTER — Ambulatory Visit: Payer: 59 | Admitting: Internal Medicine

## 2017-10-22 ENCOUNTER — Encounter: Payer: Self-pay | Admitting: Internal Medicine

## 2017-10-22 VITALS — BP 116/84 | HR 73 | Ht 61.42 in | Wt 186.2 lb

## 2017-10-22 DIAGNOSIS — E11319 Type 2 diabetes mellitus with unspecified diabetic retinopathy without macular edema: Secondary | ICD-10-CM

## 2017-10-22 DIAGNOSIS — Z794 Long term (current) use of insulin: Secondary | ICD-10-CM | POA: Diagnosis not present

## 2017-10-22 DIAGNOSIS — E669 Obesity, unspecified: Secondary | ICD-10-CM | POA: Diagnosis not present

## 2017-10-22 LAB — POCT GLYCOSYLATED HEMOGLOBIN (HGB A1C): Hemoglobin A1C: 8.6 % — AB (ref 4.0–5.6)

## 2017-10-22 NOTE — Progress Notes (Signed)
Patient ID: Kristen [name], female   DOB: 1951-03-06, 67 y.o.   MRN: [id number]  HPI: Kristen [name] is a 67 y.o.-year-old female, returning for f/u for DM2, dx in 2000, insulin-dependent since 2012, uncontrolled, with complications (DR). Last visit 3 months ago.  She was on a cruise 2 weeks ago >> sugars higher. Also, since last visit, she had to come off Victoza 2/2 price. She increased Humalog and Basaglar since.  Reviewed latest HbA1c: Lab Results  Component Value Date   HGBA1C 8.4 07/24/2017   HGBA1C 9.4 04/23/2017   HGBA1C 8.7 11/21/2016   She is on: - Basaglar 30 >> 40 units in the morning - Humalog 20 units in am and 25 units before dinner >> 25 units 3x a day (ran out for 1-2 weeks) - Jardiance 25 mg before breakfast She stopped  Victoza b/c $$$ She was on Jardiance 25 mg daily before b'fast. - added 03/2016 - stopped 06/2016  >> sugars increased >> at last visit, I advised her to restart >> did not do so as this is expensive. She tried Metformin ER 500 mg >> still N/D/AP.  Pt checks her sugars twice a day: - am:  133-148 >> 157-160 >> 61, 78, 82-145 >> 85-143 - 2h after b'fast: n/c >> 180-203 >> n/c >> 152  >> n/c - before lunch: n/c  - 2h after lunch: n/c - before dinner:  265-285  >> n/c >> 192 >> n/c - 2h after dinner: 280-295 >> n/c  - bedtime: n/c >> 219-306 >> 228, 293 >> 174-295 - nighttime: 65 >> n/c Lowest 105 >> 137 >> 61 >> 85; she has hypoglycemia awareness a in the 70s. Highest sugar was 285 >> 300s >> 293 >> 295.  Glucometer: AccuChek  Pt's meals are - smaller meals since 04/2015: - Breakfast: yoghurt (stopped eggs); oatmeal + walnuts + raisins >> boiled egg, protein shakes - Snack: fruit  - Lunch: meat + salad + apple or apple sauce - Snack: PB and peanuts - Dinner: meat + veggies - Snacks: 2 - tuna fish + 6 crackers; peanuts; protein shakes  She plans to start exercising again.   -No CKD, last BUN/creatinine:  Lab Results  Component Value Date   BUN  14 09/01/2017   CREATININE 0.70 09/01/2017  On losartan. -+ History of HL; last set of lipids: Lab Results  Component Value Date   CHOL 135 09/01/2017   HDL 41 09/01/2017   LDLCALC 79 09/01/2017   TRIG 77 09/01/2017   CHOLHDL 3.3 09/01/2017  She is not on a statin. - last eye exam was in 05/2017: + DR: + DR. + Glaucoma. Dr. Phineas Real ([place].) -No numbness and tingling in her feet.  She also has a history of GERD, HTN.  She is on a liver support supplement containing milk thistle.  Bowel movements more regular.  ROS: Constitutional: no weight gain/no weight loss, no fatigue, no subjective hyperthermia, no subjective hypothermia Eyes: no blurry vision, no xerophthalmia ENT: no sore throat, no nodules palpated in throat, no dysphagia, no odynophagia, no hoarseness Cardiovascular: no CP/no SOB/no palpitations/no leg swelling Respiratory: no cough/no SOB/no wheezing Gastrointestinal: no N/no V/no D/no C/no acid reflux Musculoskeletal: no muscle aches/no joint aches Skin: no rashes, no hair loss Neurological: no tremors/no numbness/no tingling/no dizziness  I reviewed pt's medications, allergies, PMH, social hx, family hx, and changes were documented in the history of present illness. Otherwise, unchanged from my initial visit note.  Past Medical History:  Diagnosis  Date  . Diabetes mellitus   . Glaucoma   . HTN (hypertension)   . Meralgia paresthetica    Past Surgical History:  Procedure Laterality Date  . BREAST CYST EXCISION    . CHOLECYSTECTOMY  06/16/2008  . TONSILLECTOMY AND ADENOIDECTOMY     Social History   Social History  . Marital Status: Widowed    Spouse Name: N/A  . Number of Children: 1   Occupational History  .    Social History Main Topics  . Smoking status: Never Smoker   . Smokeless tobacco: Not on file  . Alcohol Use: No  . Drug Use: No   Current Outpatient Medications on File Prior to Visit  Medication Sig Dispense Refill  .  albuterol (PROVENTIL HFA;VENTOLIN HFA) 108 (90 Base) MCG/ACT inhaler Inhale 1-2 puffs into the lungs every 4 (four) hours as needed for wheezing or shortness of breath. 1 Inhaler 0  . Ascorbic Acid (VITAMIN C) 1000 MG tablet Take 1,000 mg by mouth daily.    . Blood Glucose Monitoring Suppl (ACCU-CHEK GUIDE ME) w/Device KIT 1 each by Does not apply route as directed. 1 kit 0  . empagliflozin (JARDIANCE) 25 MG TABS tablet Take 25 mg daily by mouth. 90 tablet 2  . glucose blood (ACCU-CHEK AVIVA) test strip Use as instructed to check sugar 2 times daily 200 each 5  . glucose blood (PRODIGY NO CODING BLOOD GLUC) test strip Use as instructed to check 2 times daily 200 each 5  . Insulin Glargine (BASAGLAR KWIKPEN) 100 UNIT/ML SOPN INJECT SUBCUTANEOUSLY 40  UNITS AT BEDTIME 45 mL 1  . Insulin Lispro (HUMALOG KWIKPEN) 200 UNIT/ML SOPN Inject 20-25 Units into the skin 2 (two) times daily before a meal. 5 pen 11  . Insulin Pen Needle (PEN NEEDLES) 31G X 6 MM MISC 2 each by Does not apply route daily. 100 each 11  . Lancets MISC Use as directed. Lancets for meter, which ever insurance prefers 100 each 2  . losartan-hydrochlorothiazide (HYZAAR) 100-25 MG tablet Take 1 tablet by mouth daily. 90 tablet 3  . magnesium 30 MG tablet Take 30 mg by mouth 2 (two) times daily.    . Multiple Vitamin (MULTIVITAMIN) tablet Take 1 tablet by mouth daily.    . sucralfate (CARAFATE) 1 g tablet 1 tab 1 hr ac and hs 120 tablet 0  . vitamin B-12 (CYANOCOBALAMIN) 100 MCG tablet Take 100 mcg by mouth daily.    . Vitamin D, Cholecalciferol, 1000 UNITS TABS Take 2,000 Units by mouth daily. otc     No current facility-administered medications on file prior to visit.    Allergies  Allergen Reactions  . Latex Hives    itching   Family History  Problem Relation Age of Onset  . Hypertension Mother   . Diabetes Mother   . Thyroid disease Mother   . Diabetes Father   . Cancer Father        Colon cancer  . Diabetes Sister     PE: BP 116/84   Pulse 73   Ht 5' 1.42" (1.56 m)   Wt 186 lb 3.2 oz (84.5 kg)   SpO2 95%   BMI 34.70 kg/m  Body mass index is 34.7 kg/m. Wt Readings from Last 3 Encounters:  10/22/17 186 lb 3.2 oz (84.5 kg)  09/26/17 179 lb (81.2 kg)  09/01/17 179 lb 6.4 oz (81.4 kg)   Constitutional: overweight, in NAD Eyes: PERRLA, EOMI, no exophthalmos ENT: moist mucous membranes, no thyromegaly,  no cervical lymphadenopathy Cardiovascular: RRR, No MRG Respiratory: CTA B Gastrointestinal: abdomen soft, NT, ND, BS+ Musculoskeletal: no deformities, strength intact in all 4 Skin: moist, warm, no rashes Neurological: no tremor with outstretched hands, DTR normal in all 4  ASSESSMENT: 1. DM2, insulin-dependent, uncontrolled, with complications - DR  2. Obesity class 1 BMI Classification:  < 18.5 underweight   18.5-24.9 normal weight   25.0-29.9 overweight   30.0-34.9 class I obesity   35.0-39.9 class II obesity   ? 40.0 class III obesity   PLAN:  1. Patient with long-standing, uncontrolled, type 2 diabetes, on long-acting insulin, GLP-1 receptor agonist, and NovoLog, now off glipizide, which we started last visit.  She could not afford Jardiance in the past but she restarted it in the new year.  We discussed at length at last visit about improving her diet and I suggested healthier snacks and meals.  She did try a keto diet in the past but her sugars were too low.  Also, she did not like not eating fruit and starchy veggies.  - At last visit, her sugars were at goal or close to goal in the morning, but high at bedtime.  At that time, we increased Humalog insulin with dinner and decreased basal insulin.  We also moved Jardiance in the morning. - at this visit, sugars are high especially at bedtime (postdinner snacks) as she had to come off Victoza. She did increase Humalog, but this is still not enough. She also had a 1-2 week period in which she ran out Humalog! - discussed about  checking with her insurance whether other GLP1 R agonists are covered - for now, I advised her either to stop snacking at night after dinner or to cover the snacks with Humalog.  - I suggested to:  Patient Instructions  Please continue: - Basaglar 40 units in the morning - Humalog 3x a day 25 before meals - Jardiance 25 mg before breakfast  If you have a late snack, you may need to cover this with insulin: 5 units.  Please check with the pharmacy or your insurance if the following medicines are covered: - Trulicity (once a week) - Ozempic (once a week) - Bydureon (once a week) - Victoza (once a day)  Please return in 3 months with your sugar log.   - today, HbA1c is 8.6% (higher) - continue checking sugars at different times of the day - check 3x a day, rotating checks - advised for yearly eye exams >> she is UTD - Return to clinic in 3 mo with sugar log     2. Obesity  - At last visit, I suggested a whole food plant-based diet >> did not start - Jardiance should also help and I hope we can restart a GLP1 R agonist - also advised to stop snacking at night  Philemon Kingdom, MD PhD [facility] Endocrinology

## 2017-10-22 NOTE — Patient Instructions (Addendum)
Please continue: - Basaglar 40 units in the morning - Humalog 3x a day 25 before meals - Jardiance 25 mg before breakfast  If you have a late snack, you may need to cover this with insulin: 5 units.  Please check with the pharmacy or your insurance if the following medicines are covered: - Trulicity (once a week) - Ozempic (once a week) - Bydureon (once a week) - Victoza (once a day)  Please return in 3 months with your sugar log.

## 2017-11-24 ENCOUNTER — Telehealth: Payer: Self-pay

## 2017-11-24 ENCOUNTER — Telehealth: Payer: Self-pay | Admitting: Internal Medicine

## 2017-11-24 NOTE — Telephone Encounter (Signed)
Patient wants Kristen [name] to know that she does have a meter and is using it

## 2017-11-24 NOTE — Telephone Encounter (Signed)
Noted  

## 2017-11-24 NOTE — Telephone Encounter (Signed)
Received a notice that the pt was unable to get her meter covered back in May, called pt to confirm but had to LVM

## 2017-12-06 ENCOUNTER — Other Ambulatory Visit: Payer: Self-pay | Admitting: Family Medicine

## 2017-12-25 ENCOUNTER — Other Ambulatory Visit: Payer: Self-pay | Admitting: Internal Medicine

## 2018-02-10 ENCOUNTER — Other Ambulatory Visit: Payer: Self-pay | Admitting: Internal Medicine

## 2018-02-24 ENCOUNTER — Telehealth (INDEPENDENT_AMBULATORY_CARE_PROVIDER_SITE_OTHER): Payer: 59

## 2018-02-24 DIAGNOSIS — Z794 Long term (current) use of insulin: Secondary | ICD-10-CM

## 2018-02-24 DIAGNOSIS — E11319 Type 2 diabetes mellitus with unspecified diabetic retinopathy without macular edema: Secondary | ICD-10-CM

## 2018-02-24 NOTE — Telephone Encounter (Signed)
-----   Message from Carlus Pavlov, MD sent at 02/24/2018 12:55 PM EDT ----- C, Can you please enter this pt's HbA1c from 10/22/2017: 8.6%/ Ty, C

## 2018-02-25 ENCOUNTER — Ambulatory Visit: Payer: 59 | Admitting: Internal Medicine

## 2018-02-25 ENCOUNTER — Encounter: Payer: Self-pay | Admitting: Internal Medicine

## 2018-02-25 ENCOUNTER — Telehealth: Payer: Self-pay | Admitting: Endocrinology

## 2018-02-25 VITALS — BP 112/68 | HR 70 | Ht 61.42 in | Wt 185.0 lb

## 2018-02-25 DIAGNOSIS — E11319 Type 2 diabetes mellitus with unspecified diabetic retinopathy without macular edema: Secondary | ICD-10-CM | POA: Diagnosis not present

## 2018-02-25 DIAGNOSIS — E669 Obesity, unspecified: Secondary | ICD-10-CM

## 2018-02-25 DIAGNOSIS — Z794 Long term (current) use of insulin: Secondary | ICD-10-CM | POA: Diagnosis not present

## 2018-02-25 DIAGNOSIS — E1169 Type 2 diabetes mellitus with other specified complication: Secondary | ICD-10-CM | POA: Diagnosis not present

## 2018-02-25 LAB — POCT GLYCOSYLATED HEMOGLOBIN (HGB A1C): Hemoglobin A1C: 6.9 % — AB (ref 4.0–5.6)

## 2018-02-25 MED ORDER — GLUCOSE BLOOD VI STRP: ORAL_STRIP | 5 refills | Status: DC

## 2018-02-25 MED ORDER — EMPAGLIFLOZIN 25 MG PO TABS: 25.0000 mg | ORAL_TABLET | Freq: Every day | ORAL | 2 refills | Status: DC

## 2018-02-25 MED ORDER — PEN NEEDLES 31G X 6 MM MISC: 2.0000 | Freq: Every day | 11 refills | Status: DC

## 2018-02-25 MED ORDER — LANCETS MISC: 2 refills | Status: DC

## 2018-02-25 MED ORDER — INSULIN LISPRO 200 UNIT/ML ~~LOC~~ SOPN: 20.0000 [IU] | PEN_INJECTOR | Freq: Two times a day (BID) | SUBCUTANEOUS | 11 refills | Status: DC

## 2018-02-25 NOTE — Patient Instructions (Addendum)
Please continue: - Basaglar 45  units in the morning - Humalog 20 units 2x a day  before meals - Jardiance 25 mg before breakfast  KEEP UP THE GOOD WORK!  Please return in 3-4 months with your sugar log.

## 2018-02-25 NOTE — Telephone Encounter (Signed)
glipiZIDE (GLUCOTROL) 5 MG tablet °  °Patients daughter stated patient was instructed to take a half a pill when sugar is over 200.  °Pt sugar was 220 and they gave her the half.   °  °Daughter would like to know if her sugars are still high tonight should she take another. °  °Please advise °

## 2018-02-25 NOTE — Telephone Encounter (Signed)
Please disregard, Placed upon error

## 2018-02-25 NOTE — Progress Notes (Signed)
Patient ID: Kristen [name], female   DOB: Jan 02, 1951, 67 y.o.   MRN: [id number]  HPI: [name] is a 67 y.o.-year-old female, returning for f/u for DM2, dx in 2000, insulin-dependent since 2012, uncontrolled, with complications (DR). Last visit 4 months ago.  She started to exercise since last visit (goes to the gym, lifting weights and cardio) and she also cut down carbs and eats more plant-based foods.  Sugars have greatly improved.  She is, however, disappointed that she did not lose weight.  Reviewed latest HbA1c: Lab Results  Component Value Date   HGBA1C 8.6 (A) 10/22/2017   HGBA1C 8.4 07/24/2017   HGBA1C 9.4 04/23/2017   HGBA1C 8.7 11/21/2016   HGBA1C 8.5 08/22/2016   HGBA1C 9.3 04/09/2016   HGBA1C 9.3 10/30/2015   HGBA1C 8.6 07/18/2015   HGBA1C 9.9 04/16/2015   HGBA1C 9.5 09/21/2014   HGBA1C 11.4 05/18/2014   HGBA1C 9.4 08/26/2013   HGBA1C 9.6 11/24/2012   HGBA1C 9.4 09/08/2012   HGBA1C 8.4 01/08/2012   She is on: - Basaglar 45  units in the morning - Humalog 20 units 2x a day  before meals, after she cut back her lunch - Jardiance 25 mg before breakfast If you have a late snack, you may need to cover this with Humalog insulin: 5 units. She stopped  Victoza b/c $$$ She was on Jardiance 25 mg daily before b'fast. - added 03/2016 - stopped 06/2016  >> sugars increased >> at last visit, I advised her to restart >> did not do so as this is expensive. She tried Metformin ER 500 mg >> still N/D/AP.  Pt checks her sugars twice a day: - am: 157-160 >> 61, 78, 82-145 >> 85-143 >> 110-152 - 2h after b'fast: n/c >> 180-203 >> n/c >> 152  >> n/c >> 267 - before lunch: n/c  - 2h after lunch: n/c - before dinner:  265-285  >> n/c >> 192 >> n/c > 73, 110-173 - 2h after dinner: 280-295 >> n/c  - bedtime: n/c >> 219-306 >> 228, 293 >> 174-295 >>124,  194 - nighttime: 65 >> n/c Lowest 85 >> 60; she has hypoglycemia awareness in the 70s. Highest sugar was 295 >> 267  (OJ).  Glucometer: AccuChek  -No CKD, last BUN/creatinine:  Lab Results  Component Value Date   BUN 14 09/01/2017   CREATININE 0.70 09/01/2017  On losartan. -+ HL; last set of lipids: Lab Results  Component Value Date   CHOL 135 09/01/2017   HDL 41 09/01/2017   LDLCALC 79 09/01/2017   TRIG 77 09/01/2017   CHOLHDL 3.3 09/01/2017  She is not on a statin. - last eye exam was in 05/2017: + DR, + glaucoma Dr. Phineas Real ([place].) - She denies numbness and tingling in her feet.  She also has a history of GERD, HTN.  ROS: Constitutional: no weight gain/no weight loss, no fatigue, no subjective hyperthermia, no subjective hypothermia Eyes: no blurry vision, no xerophthalmia ENT: no sore throat, no nodules palpated in throat, no dysphagia, no odynophagia, no hoarseness Cardiovascular: no CP/no SOB/no palpitations/no leg swelling Respiratory: no cough/no SOB/no wheezing Gastrointestinal: no N/no V/no D/no C/no acid reflux Musculoskeletal: no muscle aches/no joint aches Skin: no rashes, no hair loss Neurological: no tremors/no numbness/no tingling/no dizziness  I reviewed pt's medications, allergies, PMH, social hx, family hx, and changes were documented in the history of present illness. Otherwise, unchanged from my initial visit note.  Past Medical History:  Diagnosis Date  .  Diabetes mellitus   . Glaucoma   . HTN (hypertension)   . Meralgia paresthetica    Past Surgical History:  Procedure Laterality Date  . BREAST CYST EXCISION    . CHOLECYSTECTOMY  06/16/2008  . TONSILLECTOMY AND ADENOIDECTOMY     Social History   Social History  . Marital Status: Widowed    Spouse Name: N/A  . Number of Children: 1   Occupational History  .    Social History Main Topics  . Smoking status: Never Smoker   . Smokeless tobacco: Not on file  . Alcohol Use: No  . Drug Use: No   Current Outpatient Medications on File Prior to Visit  Medication Sig Dispense Refill  .  albuterol (PROVENTIL HFA;VENTOLIN HFA) 108 (90 Base) MCG/ACT inhaler Inhale 1-2 puffs into the lungs every 4 (four) hours as needed for wheezing or shortness of breath. 1 Inhaler 0  . Ascorbic Acid (VITAMIN C) 1000 MG tablet Take 1,000 mg by mouth daily.    . Blood Glucose Monitoring Suppl (ACCU-CHEK GUIDE ME) w/Device KIT 1 each by Does not apply route as directed. 1 kit 0  . glucose blood (ACCU-CHEK AVIVA) test strip Use as instructed to check sugar 2 times daily 200 each 5  . glucose blood (PRODIGY NO CODING BLOOD GLUC) test strip Use as instructed to check 2 times daily 200 each 5  . Insulin Glargine (BASAGLAR KWIKPEN) 100 UNIT/ML SOPN INJECT SUBCUTANEOUSLY 40  UNITS AT BEDTIME 45 mL 1  . Insulin Lispro (HUMALOG KWIKPEN) 200 UNIT/ML SOPN Inject 20-25 Units into the skin 2 (two) times daily before a meal. (Patient taking differently: Inject 20-25 Units into the skin 3 (three) times daily before meals. ) 5 pen 11  . Insulin Pen Needle (PEN NEEDLES) 31G X 6 MM MISC 2 each by Does not apply route daily. 100 each 11  . JARDIANCE 25 MG TABS tablet TAKE 1 TABLET BY MOUTH  DAILY 90 tablet 2  . Lancets MISC Use as directed. Lancets for meter, which ever insurance prefers 100 each 2  . losartan-hydrochlorothiazide (HYZAAR) 100-25 MG tablet TAKE 1 TABLET BY MOUTH  DAILY 90 tablet 3  . magnesium 30 MG tablet Take 30 mg by mouth 2 (two) times daily.    . Multiple Vitamin (MULTIVITAMIN) tablet Take 1 tablet by mouth daily.    . sucralfate (CARAFATE) 1 g tablet 1 tab 1 hr ac and hs 120 tablet 0  . vitamin B-12 (CYANOCOBALAMIN) 100 MCG tablet Take 100 mcg by mouth daily.    . Vitamin D, Cholecalciferol, 1000 UNITS TABS Take 2,000 Units by mouth daily. otc     No current facility-administered medications on file prior to visit.    Allergies  Allergen Reactions  . Latex Hives    itching   Family History  Problem Relation Age of Onset  . Hypertension Mother   . Diabetes Mother   . Thyroid disease Mother    . Diabetes Father   . Cancer Father        Colon cancer  . Diabetes Sister    PE: BP 112/68   Pulse 70   Ht 5' 1.42" (1.56 m)   Wt 185 lb (83.9 kg)   SpO2 95%   BMI 34.48 kg/m  Body mass index is 34.48 kg/m. Wt Readings from Last 3 Encounters:  02/25/18 185 lb (83.9 kg)  10/22/17 186 lb 3.2 oz (84.5 kg)  09/26/17 179 lb (81.2 kg)   Constitutional: overweight, in NAD Eyes:  PERRLA, EOMI, no exophthalmos ENT: moist mucous membranes, no thyromegaly, no cervical lymphadenopathy Cardiovascular: RRR, No MRG Respiratory: CTA B Gastrointestinal: abdomen soft, NT, ND, BS+ Musculoskeletal: no deformities, strength intact in all 4 Skin: moist, warm, no rashes Neurological: no tremor with outstretched hands, DTR normal in all 4  ASSESSMENT: 1. DM2, insulin-dependent, uncontrolled, with complications - DR  2. Obesity class 1 BMI Classification:  < 18.5 underweight   18.5-24.9 normal weight   25.0-29.9 overweight   30.0-34.9 class I obesity   35.0-39.9 class II obesity   ? 40.0 class III obesity   PLAN:  1. Patient with long-standing, uncontrolled, type 2 diabetes, on basal-bolus insulin regimen and Jardiance.  She was on Victoza before but had to come off due to cost.  At last visit, sugars were at goal, or close to goal in the morning but high at bedtime.  At that time, we discussed him about improving her diet, and especially her snacks.  I also asked her to check with her insurance whether a GLP-1 receptor agonist is covered or not. - However, at this visit, her sugars improved significantly after she started to exercise and to improve her diet.  Even though she did not lose weight, the improvement in sugars is evident.  Her sugars are not all at goal, but much improved, therefore, for now, I suggested to continue the current regimen.  However, if she continues to do so well, I anticipate that we will need to decrease the doses of insulin soon.  I advised her to let me know  if her sugars start to drop so we can back off both Basaglar and Humalog. - I suggested to:  Patient Instructions  Please continue: - Basaglar 45  units in the morning - Humalog 20 units 2x a day  before meals - Jardiance 25 mg before breakfast  KEEP UP THE GOOD WORK!  Please return in 3-4 months with your sugar log.   - today, HbA1c is 6.9% (best in the long while, significantly improved) - continue checking sugars at different times of the day - check 2-3x a day, rotating checks - advised for yearly eye exams >> she is UTD - Return to clinic in 3-4 mo with sugar log      2. Obesity  - Did not lose weight since last visit - At last visit, I suggested a whole food plant-based diet >> she is mostly plant foods and has not been eating meat in the last month  - time spent with the patient: 25 minutes, of which >50% was spent in obtaining information about her symptoms, reviewing her previous labs, evaluations, and treatments, counseling her about her diabetes (and also her obesity) , and developing a plan to further investigate and treat it  Philemon Kingdom, MD PhD [facility] Endocrinology

## 2018-03-22 ENCOUNTER — Encounter: Payer: Self-pay | Admitting: *Deleted

## 2018-04-20 ENCOUNTER — Other Ambulatory Visit: Payer: Self-pay | Admitting: Internal Medicine

## 2018-06-07 ENCOUNTER — Other Ambulatory Visit: Payer: Self-pay | Admitting: Family Medicine

## 2018-06-07 MED ORDER — LOSARTAN POTASSIUM-HCTZ 100-25 MG PO TABS: 1.0000 | ORAL_TABLET | Freq: Every day | ORAL | 0 refills | Status: DC

## 2018-06-07 NOTE — Telephone Encounter (Signed)
Patient was advised we do not have medication samples

## 2018-06-07 NOTE — Telephone Encounter (Signed)
Copied from CRM [id number]. Topic: General - Other >> Jun 07, 2018  9:16 AM Darletta MollLander, Lumin L wrote: Reason for CRM: Wants to know if there are losartan hctz sample at the office?

## 2018-06-07 NOTE — Telephone Encounter (Signed)
Pt has an appt on 07-08-18

## 2018-06-07 NOTE — Telephone Encounter (Signed)
Copied from CRM [id number]. Topic: Quick Communication - Rx Refill/Question >> Jun 07, 2018  9:14 AM Darletta MollLander, Lumin L wrote: Medication: losartan-hydrochlorothiazide (HYZAAR) 100-25 MG tablet   Has the patient contacted their pharmacy? No. (Agent: If no, request that the patient contact the pharmacy for the refill.) (Agent: If yes, when and what did the pharmacy advise?) new pharmacy  Preferred Pharmacy (with phone number or street name): [id number] [place]- [place], [place] - [address] [place] [id number] Phone: [phone] Fax: [phone]  Agent: Please be advised that RX refills may take up to 3 business days. We ask that you follow-up with your pharmacy.

## 2018-06-07 NOTE — Telephone Encounter (Signed)
Courtesy refill. Message left to call and make an appointment.

## 2018-06-10 ENCOUNTER — Ambulatory Visit: Payer: 59 | Admitting: Internal Medicine

## 2018-06-10 ENCOUNTER — Encounter: Payer: Self-pay | Admitting: Internal Medicine

## 2018-06-10 VITALS — BP 118/70 | HR 64 | Ht 61.5 in | Wt 186.0 lb

## 2018-06-10 DIAGNOSIS — E11319 Type 2 diabetes mellitus with unspecified diabetic retinopathy without macular edema: Secondary | ICD-10-CM

## 2018-06-10 DIAGNOSIS — Z794 Long term (current) use of insulin: Secondary | ICD-10-CM

## 2018-06-10 DIAGNOSIS — E669 Obesity, unspecified: Secondary | ICD-10-CM

## 2018-06-10 LAB — POCT GLYCOSYLATED HEMOGLOBIN (HGB A1C): Hemoglobin A1C: 6.7 % — AB (ref 4.0–5.6)

## 2018-06-10 NOTE — Progress Notes (Signed)
Patient ID: Kristen [name], female   DOB: [birth year], 67 y.o.   MRN: [id number]  HPI: Kristen [name] is a 67 y.o.-year-old female, returning for f/u for DM2, dx in 2000, insulin-dependent since 2012, uncontrolled, with complications (DR). Last visit 3.5 months ago.  She started to exercise before last visit-goes to the gym, lifts weights and also does cardio.  She also cut down on her carbs and eating more plant-based foods.  At last visit, sugars were greatly improved and her HbA1c was better in a long while, at 6.9%.  She continues with her diet and exercise.  She was traveling to [place] for 2 weeks interrupted her exercise, however, she did not feel that her sugars worsened significantly during that period.  Reviewed latest HbA1c: Lab Results  Component Value Date   HGBA1C 6.9 (A) 02/25/2018   HGBA1C 8.6 (A) 10/22/2017   HGBA1C 8.4 07/24/2017   HGBA1C 9.4 04/23/2017   HGBA1C 8.7 11/21/2016   HGBA1C 8.5 08/22/2016   HGBA1C 9.3 04/09/2016   HGBA1C 9.3 10/30/2015   HGBA1C 8.6 07/18/2015   HGBA1C 9.9 04/16/2015   HGBA1C 9.5 09/21/2014   HGBA1C 11.4 05/18/2014   HGBA1C 9.4 08/26/2013   HGBA1C 9.6 11/24/2012   HGBA1C 9.4 09/08/2012   HGBA1C 8.4 01/08/2012   She is on: - Basaglar 45 units in a.m. - Humalog 20 units 2-3x a day  before meals, not taking with lunch if small meal - Jardiance 25 mg before breakfast If you have a late snack, you may need to cover this with Humalog insulin: 5 units. She stopped  Victoza b/c $$$ She was on Jardiance 25 mg daily before b'fast. - added 03/2016 - stopped 06/2016  >> sugars increased >> at last visit, I advised her to restart >> did not do so as this is expensive. She tried Metformin ER 500 mg >> still N/D/AP.  Pt checks her sugars 2x a day: - am:61, 78, 82-145 >> 85-143 >> 110-152 >> 75-150 - 2h after b'fast:  152  >> n/c >> 267 >> n/c - before lunch: n/c >> 140s (has an am snack) - 2h after lunch: n/c >> 165-200 - before dinner:  192 >> n/c >  73, 110-173 >> n/c - 2h after dinner: 280-295 >> n/c  - bedtime: 228, 293 >> 174-295 >> 124,  194 >> 175-200 - nighttime: 65 >> n/c Lowest 85 >> 60 >> 40s (exercise); she has hypoglycemia awareness in the 70s. Highest sugar was 295 >> 267 (OJ) >> 295 (party).  Glucometer: AccuChek  -no CKD, last BUN/creatinine:  Lab Results  Component Value Date   BUN 14 09/01/2017   CREATININE 0.70 09/01/2017  On losartan. -+ HL; last set of lipids: Lab Results  Component Value Date   CHOL 135 09/01/2017   HDL 41 09/01/2017   LDLCALC 79 09/01/2017   TRIG 77 09/01/2017   CHOLHDL 3.3 09/01/2017  Not on a statin. - last eye exam was in 05/2018: + DR, + glaucoma, + small cataract. Dr. Phineas Real ([place] She was referred to a retina specialist. - She denies numbness and tingling in her feet.  She also has a history of GERD, HTN.  ROS: Constitutional: no weight gain/no weight loss, no fatigue, no subjective hyperthermia, no subjective hypothermia Eyes: + R eye blurry vision, no xerophthalmia ENT: no sore throat, no nodules palpated in neck, no dysphagia, no odynophagia, no hoarseness Cardiovascular: no CP/no SOB/no palpitations/no leg swelling Respiratory: no cough/no SOB/no wheezing Gastrointestinal: no N/no V/no  D/no C/no acid reflux Musculoskeletal: no muscle aches/no joint aches Skin: no rashes, no hair loss Neurological: no tremors/no numbness/no tingling/no dizziness  I reviewed pt's medications, allergies, PMH, social hx, family hx, and changes were documented in the history of present illness. Otherwise, unchanged from my initial visit note.  Past Medical History:  Diagnosis Date  . Diabetes mellitus   . Glaucoma   . HTN (hypertension)   . Meralgia paresthetica    Past Surgical History:  Procedure Laterality Date  . BREAST CYST EXCISION    . CHOLECYSTECTOMY  06/16/2008  . TONSILLECTOMY AND ADENOIDECTOMY     Social History   Social History  . Marital Status: Widowed     Spouse Name: N/A  . Number of Children: 1   Occupational History  .    Social History Main Topics  . Smoking status: Never Smoker   . Smokeless tobacco: Not on file  . Alcohol Use: No  . Drug Use: No   Current Outpatient Medications on File Prior to Visit  Medication Sig Dispense Refill  . albuterol (PROVENTIL HFA;VENTOLIN HFA) 108 (90 Base) MCG/ACT inhaler Inhale 1-2 puffs into the lungs every 4 (four) hours as needed for wheezing or shortness of breath. 1 Inhaler 0  . Ascorbic Acid (VITAMIN C) 1000 MG tablet Take 1,000 mg by mouth daily.    . Blood Glucose Monitoring Suppl (ACCU-CHEK GUIDE ME) w/Device KIT 1 each by Does not apply route as directed. 1 kit 0  . empagliflozin (JARDIANCE) 25 MG TABS tablet Take 25 mg by mouth daily. 90 tablet 2  . glucose blood (ONE TOUCH ULTRA TEST) test strip USE AS INSTRUCTED TO CHECK  SUGAR 3 TIMES DAILY 300 each 1  . glucose blood (PRODIGY NO CODING BLOOD GLUC) test strip Use as instructed to check 2 times daily 200 each 5  . Insulin Glargine (BASAGLAR KWIKPEN) 100 UNIT/ML SOPN INJECT SUBCUTANEOUSLY 40  UNITS AT BEDTIME 45 mL 1  . Insulin Lispro (HUMALOG KWIKPEN) 200 UNIT/ML SOPN Inject 20-25 Units into the skin 2 (two) times daily before a meal. 5 pen 11  . Insulin Pen Needle (PEN NEEDLES) 31G X 6 MM MISC 2 each by Does not apply route daily. 100 each 11  . Lancets MISC Use as directed. Lancets for meter, which ever insurance prefers 100 each 2  . losartan-hydrochlorothiazide (HYZAAR) 100-25 MG tablet Take 1 tablet by mouth daily. 30 tablet 0  . magnesium 30 MG tablet Take 30 mg by mouth 2 (two) times daily.    . Multiple Vitamin (MULTIVITAMIN) tablet Take 1 tablet by mouth daily.    . sucralfate (CARAFATE) 1 g tablet 1 tab 1 hr ac and hs 120 tablet 0  . vitamin B-12 (CYANOCOBALAMIN) 100 MCG tablet Take 100 mcg by mouth daily.    . Vitamin D, Cholecalciferol, 1000 UNITS TABS Take 2,000 Units by mouth daily. otc     No current  facility-administered medications on file prior to visit.    Allergies  Allergen Reactions  . Latex Hives    itching   Family History  Problem Relation Age of Onset  . Hypertension Mother   . Diabetes Mother   . Thyroid disease Mother   . Diabetes Father   . Cancer Father        Colon cancer  . Diabetes Sister    PE: BP 118/70   Pulse 64   Ht 5' 1.5" (1.562 m) Comment: measured  Wt 186 lb (84.4 kg)   SpO2  97%   BMI 34.58 kg/m  Body mass index is 34.58 kg/m. Wt Readings from Last 3 Encounters:  06/10/18 186 lb (84.4 kg)  02/25/18 185 lb (83.9 kg)  10/22/17 186 lb 3.2 oz (84.5 kg)   Constitutional: overweight, in NAD Eyes: PERRLA, EOMI, no exophthalmos ENT: moist mucous membranes, no thyromegaly, no cervical lymphadenopathy Cardiovascular: RRR, No MRG Respiratory: CTA B Gastrointestinal: abdomen soft, NT, ND, BS+ Musculoskeletal: no deformities, strength intact in all 4 Skin: moist, warm, no rashes Neurological: + L hand tremor with outstretched hands, DTR normal in all 4  ASSESSMENT: 1. DM2, insulin-dependent, uncontrolled, with complications - DR  2. Obesity class 1 BMI Classification:  < 18.5 underweight   18.5-24.9 normal weight   25.0-29.9 overweight   30.0-34.9 class I obesity   35.0-39.9 class II obesity   ? 40.0 class III obesity   PLAN:  1. Patient with longstanding, uncontrolled, type 2 diabetes, on basal-bolus insulin regimen and also Jardiance.  She was on Victoza before but had to come off due to cost.  She started to improve her diet before last visit and also to exercise consistently.  Her sugars greatly improved and her HbA1c was 6.9%, better in a long while.  She was disappointed that she did not lose weight.  We did not change her regimen at that time. -At this visit, sugars remain controlled, with occasional spikes after meals, but overall sugars at target.  We will not change the regimen for now, but we discussed about the possibility of  adding a GLP-1 receptor agonist to replace some of the insulin that she is taking.  She will check with her insurance whether these are covered. -She had a low blood sugar after exercise-he exercises between 3 and 5:30 PM, when she gets out of work.  Since this is far from lunch, we discussed about adding a snack of 15 g of carbs before exercise. - I suggested to:  Patient Instructions  Please continue: - Basaglar 45 units in the morning - Humalog 20 units 2x a day  before meals - Jardiance 25 mg before breakfast  You may need a 15g carb snack before exercise.  Please check with the pharmacy or your insurance if the following medicines are covered: - Trulicity (once a week) - Ozempic (once a week) - Bydureon (once a week) - Victoza (once a day)  Please return in 4 months with your sugar log.   - today, HbA1c is 6.9% (stable, great!) - continue checking sugars at different times of the day - check 2x a day, rotating checks - advised for yearly eye exams >> she is UTD - Return to clinic in 3-4 mo with sugar log       2. Obesity  -No weight loss since last visit -We will continue her SGLT2 inhibitor, which should also help with weight loss -We also discussed about adding a GLP-1 receptor agonist and hopefully decreasing the dose of her Humalog/Basaglar.  I gave her a list of GLP-1 receptor agonist and she will check with her insurance to see which ones are covered.   Philemon Kingdom, MD PhD [facility] Endocrinology

## 2018-06-10 NOTE — Patient Instructions (Addendum)
Please continue: - Basaglar 45 units in the morning - Humalog 20 units 2x a day  before meals - Jardiance 25 mg before breakfast  You may need a 15g carb snack before exercise.  Please check with the pharmacy or your insurance if the following medicines are covered: - Trulicity (once a week) - Ozempic (once a week) - Bydureon (once a week) - Victoza (once a day)  Please return in 4 months with your sugar log.

## 2018-07-01 ENCOUNTER — Other Ambulatory Visit: Payer: Self-pay | Admitting: Internal Medicine

## 2018-07-02 ENCOUNTER — Other Ambulatory Visit: Payer: Self-pay | Admitting: Family Medicine

## 2018-07-02 DIAGNOSIS — Z1231 Encounter for screening mammogram for malignant neoplasm of breast: Secondary | ICD-10-CM

## 2018-07-06 ENCOUNTER — Telehealth: Payer: Self-pay | Admitting: Family Medicine

## 2018-07-06 NOTE — Telephone Encounter (Signed)
LVM for pt to call the office and reschedule her appt that was scheduled with Dr. Clelia Croft on 07/08/18. Due to Dr. Clelia Croft being on emergency leave, she will not be back until 07/26/18 or shortly after. The pt may either make an appt with another provider or if it can wait, she may schedule with Dr. Clelia Croft 07/26/18 or after. Thank you!

## 2018-07-08 ENCOUNTER — Ambulatory Visit: Payer: 59 | Admitting: Family Medicine

## 2018-07-09 ENCOUNTER — Telehealth: Payer: Self-pay | Admitting: Internal Medicine

## 2018-07-09 NOTE — Telephone Encounter (Signed)
Notified patient of message from Dr. Elvera Lennox, patient expressed understanding and agreement. No further questions.  Will also send instructions to her MyChart.

## 2018-07-09 NOTE — Telephone Encounter (Signed)
Start at 0.6 mg for 3-4 days, then 1.2 mg for 3-4 days, then 1.8 mg daily in am. May need to decrease Humalog if she restarts this.

## 2018-07-09 NOTE — Telephone Encounter (Signed)
Patient went ahead and started it and is now vomiting. pls advise.

## 2018-07-09 NOTE — Telephone Encounter (Signed)
She needs to stop it  - if she started at a high dose, she needs to wait ~2 weeks and may restart as detailed in previous msg

## 2018-07-09 NOTE — Telephone Encounter (Signed)
Patient took RX this morning and is currently throwing up. Please Advise, thanks

## 2018-07-09 NOTE — Telephone Encounter (Signed)
Patient would like a call back from the nurse about how many units of victoza she was taking. She stated a Friend gave her some of this and she wanted this information so she could take this,   Please advise

## 2018-08-13 ENCOUNTER — Other Ambulatory Visit: Payer: Self-pay | Admitting: Internal Medicine

## 2018-08-26 ENCOUNTER — Other Ambulatory Visit: Payer: Self-pay

## 2018-08-26 ENCOUNTER — Other Ambulatory Visit: Payer: 59

## 2018-08-26 ENCOUNTER — Ambulatory Visit
Admission: RE | Admit: 2018-08-26 | Discharge: 2018-08-26 | Disposition: A | Discharge status: Home / Self Care | Payer: 59 | Source: Ambulatory Visit | Attending: Family Medicine | Admitting: Family Medicine

## 2018-08-26 DIAGNOSIS — Z1231 Encounter for screening mammogram for malignant neoplasm of breast: Secondary | ICD-10-CM

## 2018-09-13 ENCOUNTER — Telehealth: Payer: Self-pay

## 2018-09-13 NOTE — Telephone Encounter (Signed)
Jardiance not covered by insurance.  Please advise if you would like to try a PA.

## 2018-09-13 NOTE — Telephone Encounter (Signed)
Let's try Farxiga 10 mg first: #30 with 11 refills

## 2018-09-14 ENCOUNTER — Other Ambulatory Visit: Payer: Self-pay | Admitting: Family Medicine

## 2018-09-14 DIAGNOSIS — E2839 Other primary ovarian failure: Secondary | ICD-10-CM

## 2018-09-14 NOTE — Telephone Encounter (Signed)
Marcelline Deist is not covered either.

## 2018-09-14 NOTE — Telephone Encounter (Signed)
OK, let's try a PA for Jardiance.

## 2018-09-20 NOTE — Telephone Encounter (Signed)
Prior authorization for London Pepper has been approved by patient's insurance.  Coverage is effective until 09/16/2019  Approval letter has been sent to scanning.

## 2018-09-27 ENCOUNTER — Other Ambulatory Visit: Payer: 59

## 2018-10-14 ENCOUNTER — Other Ambulatory Visit: Payer: Self-pay

## 2018-10-14 ENCOUNTER — Encounter: Payer: Self-pay | Admitting: Internal Medicine

## 2018-10-14 ENCOUNTER — Ambulatory Visit (INDEPENDENT_AMBULATORY_CARE_PROVIDER_SITE_OTHER): Payer: 59 | Admitting: Internal Medicine

## 2018-10-14 DIAGNOSIS — E669 Obesity, unspecified: Secondary | ICD-10-CM

## 2018-10-14 DIAGNOSIS — Z794 Long term (current) use of insulin: Secondary | ICD-10-CM | POA: Diagnosis not present

## 2018-10-14 DIAGNOSIS — E11319 Type 2 diabetes mellitus with unspecified diabetic retinopathy without macular edema: Secondary | ICD-10-CM | POA: Diagnosis not present

## 2018-10-14 MED ORDER — INSULIN LISPRO 200 UNIT/ML ~~LOC~~ SOPN: 20.0000 [IU] | PEN_INJECTOR | Freq: Every day | SUBCUTANEOUS | 3 refills | Status: DC

## 2018-10-14 MED ORDER — BASAGLAR KWIKPEN 100 UNIT/ML ~~LOC~~ SOPN: PEN_INJECTOR | SUBCUTANEOUS | 3 refills | Status: DC

## 2018-10-14 MED ORDER — EMPAGLIFLOZIN 25 MG PO TABS: 25.0000 mg | ORAL_TABLET | Freq: Every day | ORAL | 3 refills | Status: DC

## 2018-10-14 NOTE — Progress Notes (Signed)
Patient ID: Kristen [name], female   DOB: Jul 01, 1950, 68 y.o.   MRN: [id number]  Patient location: Home My location: Office  Referring Provider: Shawnee Knapp, MD  I connected with the patient on 10/14/18 at  3:57 PM EDT by a video enabled telemedicine application and verified that I am speaking with the correct person.   I discussed the limitations of evaluation and management by telemedicine and the availability of in person appointments. The patient expressed understanding and agreed to proceed.   Details of the encounter are shown below.  HPI: Kristen [name] is a 68 y.o.-year-old female, returning for f/u for DM2, dx in 2000, insulin-dependent since 2012, uncontrolled, with complications (DR). Last visit 4 months ago.  Approximately a year ago, she started to exercise (lifting weights and also cardio).  She also cut down on her carbs and was eating more plant-based foods.  Her sugars improved significantly, as did her HbA1c.  She still exercises 15-20 min 3x a week. She cut down further on carbs.  Reviewed latest HbA1c: Lab Results  Component Value Date   HGBA1C 6.7 (A) 06/10/2018   HGBA1C 6.9 (A) 02/25/2018   HGBA1C 8.6 (A) 10/22/2017   HGBA1C 8.4 07/24/2017   HGBA1C 9.4 04/23/2017   HGBA1C 8.7 11/21/2016   HGBA1C 8.5 08/22/2016   HGBA1C 9.3 04/09/2016   HGBA1C 9.3 10/30/2015   HGBA1C 8.6 07/18/2015   HGBA1C 9.9 04/16/2015   HGBA1C 9.5 09/21/2014   HGBA1C 11.4 05/18/2014   HGBA1C 9.4 08/26/2013   HGBA1C 9.6 11/24/2012   HGBA1C 9.4 09/08/2012   HGBA1C 8.4 01/08/2012   She is on: - Basaglar 45 >> 40 units in the morning - Humalog 20 units 2x a day  before meals >> now only in am with b'fast - Jardiance 25 mg before breakfast She stopped  Victoza b/c $$$ She was on Jardiance 25 mg daily before b'fast. - added 03/2016 - stopped 06/2016  >> sugars increased >> at last visit, I advised her to restart >> did not do so as this was expensive. She tried Metformin ER 500 mg >> still  N/D/AP.  Pt checks her sugars 2x a day: - am: 85-143 >> 110-152 >> 75-150 >> 75, 100-130 - 2h after b'fast:  152  >> n/c >> 267 >> n/c - before lunch: n/c >> 140s (has an am snack) >> 130-135 - 2h after lunch: n/c >> 165-200 >> 130-165, 180  - before dinner:  192 >> n/c > 73, 110-173 >> n/c >> 125-132 - 2h after dinner: 280-295 >> n/c  - bedtime: 174-295 >> 124,  194 >> 175-200 >> 173, 180 - nighttime: 65 >> n/c Lowest 85 >> 60 >> 40s (exercise) >> 75; she has hypoglycemia awareness in the 70s. Highest sugar was 295 >> 267 (OJ) >> 295 (party) >> 240.  Glucometer: AccuChek  -No CKD, last BUN/creatinine:  Lab Results  Component Value Date   BUN 14 09/01/2017   CREATININE 0.70 09/01/2017  On losartan. -+ HL; last set of lipids: Lab Results  Component Value Date   CHOL 135 09/01/2017   HDL 41 09/01/2017   LDLCALC 79 09/01/2017   TRIG 77 09/01/2017   CHOLHDL 3.3 09/01/2017  Not on statin. - last eye exam was in 05/2018: + DR, + glaucoma, + small cataract. Dr. Phineas Real ([place].). She was referred to a retina specialist. -No numbness and tingling in her feet.  She also has a history of GERD, HTN.  ROS: Constitutional: no weight  gain/+ weight loss (5 lbs), no fatigue, no subjective hyperthermia, no subjective hypothermia Eyes: no blurry vision, no xerophthalmia ENT: no sore throat, no nodules palpated in neck, no dysphagia, no odynophagia, no hoarseness Cardiovascular: no CP/no SOB/no palpitations/no leg swelling Respiratory: no cough/no SOB/no wheezing Gastrointestinal: no N/no V/no D/no C/no acid reflux Musculoskeletal: no muscle aches/no joint aches Skin: no rashes, no hair loss Neurological: no tremors/no numbness/no tingling/no dizziness  I reviewed pt's medications, allergies, PMH, social hx, family hx, and changes were documented in the history of present illness. Otherwise, unchanged from my initial visit note.  Past Medical History:  Diagnosis Date  .  Diabetes mellitus   . Glaucoma   . HTN (hypertension)   . Meralgia paresthetica    Past Surgical History:  Procedure Laterality Date  . BREAST CYST EXCISION    . CHOLECYSTECTOMY  06/16/2008  . TONSILLECTOMY AND ADENOIDECTOMY     Social History   Social History  . Marital Status: Widowed    Spouse Name: N/A  . Number of Children: 1   Occupational History  .    Social History Main Topics  . Smoking status: Never Smoker   . Smokeless tobacco: Not on file  . Alcohol Use: No  . Drug Use: No   Current Outpatient Medications on File Prior to Visit  Medication Sig Dispense Refill  . albuterol (PROVENTIL HFA;VENTOLIN HFA) 108 (90 Base) MCG/ACT inhaler Inhale 1-2 puffs into the lungs every 4 (four) hours as needed for wheezing or shortness of breath. 1 Inhaler 0  . Ascorbic Acid (VITAMIN C) 1000 MG tablet Take 1,000 mg by mouth daily.    Marland Kitchen aspirin EC 81 MG tablet Take 81 mg by mouth daily.    . Blood Glucose Monitoring Suppl (ACCU-CHEK GUIDE ME) w/Device KIT 1 each by Does not apply route as directed. 1 kit 0  . empagliflozin (JARDIANCE) 25 MG TABS tablet Take 25 mg by mouth daily. 90 tablet 2  . glucose blood (ONE TOUCH ULTRA TEST) test strip USE AS INSTRUCTED TO CHECK  SUGAR 3 TIMES DAILY 300 each 1  . glucose blood (PRODIGY NO CODING BLOOD GLUC) test strip Use as instructed to check 2 times daily 200 each 5  . Insulin Glargine (BASAGLAR KWIKPEN) 100 UNIT/ML SOPN INJECT SUBCUTANEOUSLY 40  UNITS AT BEDTIME 45 mL 1  . Insulin Lispro (HUMALOG KWIKPEN) 200 UNIT/ML SOPN Inject 20-25 Units into the skin 2 (two) times daily before a meal. 5 pen 11  . Insulin Pen Needle (PEN NEEDLES) 31G X 6 MM MISC 2 each by Does not apply route daily. 100 each 11  . Lancets (ONETOUCH DELICA PLUS [id number]) MISC TEST 3 TIMES DAILY 300 each 1  . losartan-hydrochlorothiazide (HYZAAR) 100-25 MG tablet Take 1 tablet by mouth daily. 30 tablet 0  . magnesium 30 MG tablet Take 30 mg by mouth 2 (two) times daily.     . Multiple Vitamin (MULTIVITAMIN) tablet Take 1 tablet by mouth daily.    . vitamin B-12 (CYANOCOBALAMIN) 100 MCG tablet Take 100 mcg by mouth daily.    . Vitamin D, Cholecalciferol, 1000 UNITS TABS Take 2,000 Units by mouth daily. otc     No current facility-administered medications on file prior to visit.    Allergies  Allergen Reactions  . Latex Hives    itching   Family History  Problem Relation Age of Onset  . Hypertension Mother   . Diabetes Mother   . Thyroid disease Mother   . Diabetes Father   .  Cancer Father        Colon cancer  . Diabetes Sister    PE: There were no vitals taken for this visit. There is no height or weight on file to calculate BMI. Wt Readings from Last 3 Encounters:  06/10/18 186 lb (84.4 kg)  02/25/18 185 lb (83.9 kg)  10/22/17 186 lb 3.2 oz (84.5 kg)   Constitutional:  in NAD  The physical exam was not performed (virtual visit).  ASSESSMENT: 1. DM2, insulin-dependent, uncontrolled, with complications - DR  2. Obesity class 1 BMI Classification:  < 18.5 underweight   18.5-24.9 normal weight   25.0-29.9 overweight   30.0-34.9 class I obesity   35.0-39.9 class II obesity   ? 40.0 class III obesity   PLAN:  1. Patient with longstanding, uncontrolled, type 2 diabetes, on basal-bolus insulin regimen and Jardiance.  Vania Rea is still expensive.  She was on Victoza in the past but had to come off due to cost.  At last visit, she was doing a good job with exercise and diet and even dropping her sugars after exercise.  I advised her to be ready to use a 15 g carb snack before exercise, when she gets out of work.  We did not change her regimen at that time.  However, I did advise her to check with her insurance whether the GLP-1 receptor agonist are not covered. -Since last visit, she started to reduce her carbs even more and she had to reduce the dose of Humalog due to persistent lows in the 70s.  She is now taking it once a day, before  breakfast -At this visit, sugars are at goal most of the time, with slightly high sugars after dinner, but still in the range.  We discussed about increasing exercise by adding light activity in the second half of the day and, I advised her that if she does develop lower blood sugars at night or in the morning, we will need to decrease the Basaglar dose.  I did not increase her Humalog dose.  We did discuss that if a GLP-1 receptor agonist would be covered, we may be able to replace her a.m. Humalog with this. - I suggested to:  Patient Instructions  Please continue: - Basaglar 40 units in the morning - Humalog 20 units 1x a day before b'fast - Jardiance 25 mg before breakfast  Please check with the pharmacy or your insurance if the following medicines are covered: - Trulicity (once a week) - Ozempic (once a week) - Bydureon (once a week) - Victoza (once a day)  Please return in 4 months with your sugar log.   -We will check another HbA1c when she returns to the clinic - continue checking sugars at different times of the day - check 2x a day, rotating checks - advised for yearly eye exams >> she is UTD - Return to clinic in 4 mo with sugar log        2. Obesity  - lost 5 lbs since last visit -Continue SGLT 2 inhibitor, which is also help with weight loss -At last visit, I gave her a list of GLP-1 receptor agonists to check with her insurance whether they are covered.  She did not check yet.  We discussed that if one is covered, we can switch from Humalog to this, with multiple benefits including weight loss   Philemon Kingdom, MD PhD [facility] Endocrinology

## 2018-10-14 NOTE — Patient Instructions (Signed)
Please continue: - Basaglar 45 units in the morning - Humalog 20 units 1x a day before b'fast - Jardiance 25 mg before breakfast  Please check with the pharmacy or your insurance if the following medicines are covered: - Trulicity (once a week) - Ozempic (once a week) - Bydureon (once a week) - Victoza (once a day)  Please return in 4 months with your sugar log.

## 2018-12-02 ENCOUNTER — Other Ambulatory Visit: Payer: 59

## 2019-01-26 ENCOUNTER — Ambulatory Visit
Admission: RE | Admit: 2019-01-26 | Discharge: 2019-01-26 | Disposition: A | Discharge status: Home / Self Care | Payer: 59 | Source: Ambulatory Visit | Attending: Family Medicine | Admitting: Family Medicine

## 2019-01-26 ENCOUNTER — Other Ambulatory Visit: Payer: Self-pay

## 2019-01-26 DIAGNOSIS — E2839 Other primary ovarian failure: Secondary | ICD-10-CM

## 2019-02-18 ENCOUNTER — Ambulatory Visit: Payer: 59 | Admitting: Family Medicine

## 2019-02-18 ENCOUNTER — Other Ambulatory Visit: Payer: Self-pay | Admitting: Internal Medicine

## 2019-03-02 ENCOUNTER — Ambulatory Visit (INDEPENDENT_AMBULATORY_CARE_PROVIDER_SITE_OTHER): Payer: 59 | Admitting: Family Medicine

## 2019-03-02 ENCOUNTER — Encounter: Payer: Self-pay | Admitting: Family Medicine

## 2019-03-02 ENCOUNTER — Other Ambulatory Visit: Payer: Self-pay

## 2019-03-02 VITALS — BP 128/64 | HR 61 | Temp 97.3°F | Ht 61.5 in | Wt 175.0 lb

## 2019-03-02 DIAGNOSIS — I10 Essential (primary) hypertension: Secondary | ICD-10-CM

## 2019-03-02 DIAGNOSIS — Z800 Family history of malignant neoplasm of digestive organs: Secondary | ICD-10-CM

## 2019-03-02 DIAGNOSIS — Z794 Long term (current) use of insulin: Secondary | ICD-10-CM

## 2019-03-02 DIAGNOSIS — E11319 Type 2 diabetes mellitus with unspecified diabetic retinopathy without macular edema: Secondary | ICD-10-CM

## 2019-03-02 LAB — MICROALBUMIN / CREATININE URINE RATIO
Creatinine,U: 29.5 mg/dL
Microalb Creat Ratio: 2.4 mg/g (ref 0.0–30.0)
Microalb, Ur: <0.7 mg/dL (ref 0.0–1.9)

## 2019-03-02 LAB — CBC WITH DIFFERENTIAL/PLATELET
Basophils Absolute: 0 10*3/uL (ref 0.0–0.1)
Basophils Relative: 0.8 % (ref 0.0–3.0)
Eosinophils Absolute: 0.1 10*3/uL (ref 0.0–0.7)
Eosinophils Relative: 2.4 % (ref 0.0–5.0)
HCT: 42.5 % (ref 36.0–46.0)
Hemoglobin: 14.2 g/dL (ref 12.0–15.0)
Lymphocytes Relative: 35.3 % (ref 12.0–46.0)
Lymphs Abs: 2 10*3/uL (ref 0.7–4.0)
MCHC: 33.4 g/dL (ref 30.0–36.0)
MCV: 91.2 fl (ref 78.0–100.0)
Monocytes Absolute: 0.4 10*3/uL (ref 0.1–1.0)
Monocytes Relative: 7.1 % (ref 3.0–12.0)
Neutro Abs: 3 10*3/uL (ref 1.4–7.7)
Neutrophils Relative %: 54.4 % (ref 43.0–77.0)
Platelets: 167 10*3/uL (ref 150.0–400.0)
RBC: 4.67 Mil/uL (ref 3.87–5.11)
RDW: 13.7 % (ref 11.5–15.5)
WBC: 5.6 10*3/uL (ref 4.0–10.5)

## 2019-03-02 LAB — LIPID PANEL
Cholesterol: 143 mg/dL (ref 0–200)
HDL: 49.2 mg/dL (ref >39.00)
LDL Cholesterol: 79 mg/dL (ref 0–99)
NonHDL: 93.43
Total CHOL/HDL Ratio: 3
Triglycerides: 72 mg/dL (ref 0.0–149.0)
VLDL: 14.4 mg/dL (ref 0.0–40.0)

## 2019-03-02 LAB — COMPREHENSIVE METABOLIC PANEL
ALT: 29 U/L (ref 0–35)
AST: 23 U/L (ref 0–37)
Albumin: 4.5 g/dL (ref 3.5–5.2)
Alkaline Phosphatase: 81 U/L (ref 39–117)
BUN: 15 mg/dL (ref 6–23)
CO2: 25 mEq/L (ref 19–32)
Calcium: 9.6 mg/dL (ref 8.4–10.5)
Chloride: 98 mEq/L (ref 96–112)
Creatinine, Ser: 0.62 mg/dL (ref 0.40–1.20)
GFR: 115.93 mL/min (ref >60.00)
Glucose, Bld: 120 mg/dL — ABNORMAL HIGH (ref 70–99)
Potassium: 3.3 mEq/L — ABNORMAL LOW (ref 3.5–5.1)
Sodium: 135 mEq/L (ref 135–145)
Total Bilirubin: 0.6 mg/dL (ref 0.2–1.2)
Total Protein: 7 g/dL (ref 6.0–8.3)

## 2019-03-02 LAB — TSH: TSH: 2.47 u[IU]/mL (ref 0.35–4.50)

## 2019-03-02 LAB — HEMOGLOBIN A1C: Hgb A1c MFr Bld: 7.6 % — ABNORMAL HIGH (ref 4.6–6.5)

## 2019-03-02 MED ORDER — HUMALOG KWIKPEN 200 UNIT/ML ~~LOC~~ SOPN: 20.0000 [IU] | PEN_INJECTOR | Freq: Every day | SUBCUTANEOUS | 3 refills | Status: DC

## 2019-03-02 MED ORDER — LOSARTAN POTASSIUM-HCTZ 100-25 MG PO TABS: 1.0000 | ORAL_TABLET | Freq: Every day | ORAL | 1 refills | Status: DC

## 2019-03-02 NOTE — Progress Notes (Signed)
Phone: [phone]  Subjective:  Patient presents today for establishing care. 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   Hypertension: Here for follow up of hypertension.  Currently on losartan-hctz. Takes medication as prescribed and denies any side effects. Exercise includes walking. Weight has been stable. Denies any chest pain, headaches, shortness of breath, vision changes, swelling in lower extremities.   Diabetes: Patient is here for follow up of type 2 diabetes. First diagnosed 2001.   Currently on the following medications basaglar 40 units, jardiance, and humalog 20-25 units Q breakfast. She stopped the humalog completely and then stopped the basaglar about 5 weeks ago on her own accord. She will occasionally take this on the weekends. She started intermittent fasting and didn't want her sugars to drop too low. Takes medications as prescribed. Last A1C was 6.7, 8 months ago. Currently exercising and following diabetic diet. Sugars range from 90 to 140 in the AM. Denies any hypoglycemic events. Denies any vision changes, nausea, vomiting, abdominal pain, ulcers/paraesthesia in feet, polyuria, polydipsia or polyphagia. Denies any chest pain, shortness of breath. Was seeing endocrinology for this and last seen April of this year.   Father had colon cancer at age 60 years of age. Can not afford a colonoscopy.   Declines flu/pneumonia shot.   Preventive Screening-Counseling & Management  Vision screen: sees ophthamologist (last seen 01/2019) No exam data present  Advanced directives: none  Smoking Status: Never Smoker Second Hand Smoking status: No smokers in home  Risk Factors Regular exercise: none currently Diet: well balanced-intermittent fasting  Fall Risk: None  Fall Risk  03/02/2019 09/26/2017 09/01/2017 01/29/2017 09/22/2016  Falls in the past year? 0 No No No No  Number  falls in past yr: 0 - - - -  Injury with Fall? 0 - - - -   Opioid use history:  no long term opioids use  Cardiac risk factors:  advanced age (older than 83 for men, 48 for women) yes Hyperlipidemia no No diabetes. yes Family History: yes-mom and dad both diabetic  Depression Screen None. PHQ2 0  Depression screen [facility] 2/9 03/02/2019 09/26/2017 09/01/2017 01/29/2017 09/22/2016  Decreased Interest 0 0 0 0 0  Down, Depressed, Hopeless 0 0 0 0 0  PHQ - 2 Score 0 0 0 0 0    Activities of Daily Living Independent ADLs and IADLs yes  Hearing Difficulties: -patient declines  Cognitive Testing No reported trouble.  2/3  Normal 3 word recall Mini-cog: 4/5  List the Names of Other Physician/Practitioners you currently use: -Dr Renne Crigler Dr. Iona Hansen -  Immunization History  Administered Date(s) Administered  . Tdap 09/08/2012   Required Immunizations needed today declines flu/pneumonia   Screening tests- up to date Health Maintenance Due  Topic Date Due  . Fecal DNA (Cologuard)  06/14/2001  . FOOT EXAM  01/29/2018  . OPHTHALMOLOGY EXAM  05/16/2018  . HEMOGLOBIN A1C  12/10/2018   Review of Systems  Constitutional: Negative for chills, fever and malaise/fatigue.  HENT: Negative for hearing loss and sore throat.   Eyes: Negative for blurred vision and double vision.  Respiratory: Negative for cough, shortness of breath and wheezing.   Cardiovascular: Negative for chest pain, palpitations and leg swelling.  Gastrointestinal: Negative for abdominal pain, blood in stool, nausea and vomiting.  Genitourinary: Negative for dysuria and hematuria.  Musculoskeletal: Negative for falls.  Skin: Negative for rash.  Neurological: Negative for dizziness  and weakness.  Psychiatric/Behavioral: Negative for memory loss and suicidal ideas. The patient is not nervous/anxious and does not have insomnia.      The following were reviewed and entered/updated in epic: Past Medical History:   Diagnosis Date  . Diabetes mellitus   . Glaucoma   . HTN (hypertension)   . Meralgia paresthetica    Patient Active Problem List   Diagnosis Date Noted  . 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) 03/02/2019  . Class 1 obesity 04/23/2017  . Vitamin D deficiency 01/20/2016  . Diabetic retinopathy ([place]) 02/03/2012  . HTN (hypertension) 01/12/2012  . Meralgia paresthetica    Past Surgical History:  Procedure Laterality Date  . BREAST CYST EXCISION    . CHOLECYSTECTOMY  06/16/2008  . TONSILLECTOMY AND ADENOIDECTOMY      Family History  Problem Relation Age of Onset  . Hypertension Mother   . Diabetes Mother   . Thyroid disease Mother   . Diabetes Father   . Cancer Father        Colon cancer  . Diabetes Sister     Medications- reviewed and updated Current Outpatient Medications  Medication Sig Dispense Refill  . albuterol (PROVENTIL HFA;VENTOLIN HFA) 108 (90 Base) MCG/ACT inhaler Inhale 1-2 puffs into the lungs every 4 (four) hours as needed for wheezing or shortness of breath. 1 Inhaler 0  . Ascorbic Acid (VITAMIN C) 1000 MG tablet Take 1,000 mg by mouth daily.    Marland Kitchen aspirin EC 81 MG tablet Take 81 mg by mouth daily.    . Blood Glucose Monitoring Suppl (ACCU-CHEK GUIDE ME) w/Device KIT 1 each by Does not apply route as directed. 1 kit 0  . empagliflozin (JARDIANCE) 25 MG TABS tablet Take 25 mg by mouth daily. 90 tablet 3  . glucose blood (PRODIGY NO CODING BLOOD GLUC) test strip Use as instructed to check 2 times daily 200 each 5  . Insulin Glargine (BASAGLAR KWIKPEN) 100 UNIT/ML SOPN INJECT SUBCUTANEOUSLY 40 units in am 45 mL 3  . Insulin Lispro (HUMALOG KWIKPEN) 200 UNIT/ML SOPN Inject 20-25 Units into the skin daily before breakfast. 10 pen 3  . Insulin Pen Needle (PEN NEEDLES) 31G X 6 MM MISC 2 each by Does not apply route daily. 100 each 11  . Lancets (ONETOUCH DELICA PLUS JMEQAS34H) MISC TEST 3 TIMES DAILY 300  each 1  . losartan-hydrochlorothiazide (HYZAAR) 100-25 MG tablet Take 1 tablet by mouth daily. 90 tablet 1  . magnesium 30 MG tablet Take 30 mg by mouth 2 (two) times daily.    . Multiple Vitamin (MULTIVITAMIN) tablet Take 1 tablet by mouth daily.    Glory Rosebush ULTRA test strip USE AS INSTRUCTED TO CHECK  SUGAR 3 TIMES DAILY 300 strip 3  . vitamin B-12 (CYANOCOBALAMIN) 100 MCG tablet Take 100 mcg by mouth daily.    . Vitamin D, Cholecalciferol, 1000 UNITS TABS Take 2,000 Units by mouth daily. otc     No current facility-administered medications for this visit.     Allergies-reviewed and updated Allergies  Allergen Reactions  . Latex Hives    itching    Social History   Socioeconomic History  . Marital status: Widowed    Spouse name: Not on file  . Number of children: 4  . Years of education: Not on file  . Highest education level: Some college, no degree  Occupational History  . Occupation: Engineering geologist: [employer]    Comment: Radio producer  Social Needs  . Financial resource strain: Not hard at all  . Food insecurity    Worry: Never true    Inability: Never true  . Transportation needs    Medical: No    Non-medical: No  Tobacco Use  . Smoking status: Never Smoker  . Smokeless tobacco: Never Used  Substance and Sexual Activity  . Alcohol use: No  . Drug use: No  . Sexual activity: Yes    Birth control/protection: None    Comment: number of sex partners in the last 12 months  0  Lifestyle  . Physical activity    Days per week: 3 days    Minutes per session: 90 min  . Stress: Not at all  Relationships  . Social connections    Talks on phone: More than three times a week    Gets together: More than three times a week    Attends religious service: More than 4 times per year    Active member of club or organization: Yes    Attends meetings of clubs or organizations: More than 4 times per year    Relationship status: Widowed  Other Topics  Concern  . Not on file  Social History Narrative   Lives alone, though her son stays with her as needed.   4 adult children, 1 biological, 3 adopted.    Objective: BP 128/64 (BP Location: Left Arm, Patient Position: Sitting, Cuff Size: Large)   Pulse 61   Temp (!) 97.3 F (36.3 C) (Skin)   Ht 5' 1.5" (1.562 m)   Wt 175 lb (79.4 kg)   LMP  (LMP Unknown)   SpO2 97%   BMI 32.53 kg/m   Lifestyle  Physical activity  . Days per week: 3 days  . Minutes per session: 90 min    .Physical Exam  Vitals reviewed. Constitutional: She is oriented to person, place, and time. She appears well-developed and well-nourished.  HENT:  Head: Normocephalic and atraumatic.  Right Ear: External ear normal.  Left Ear: External ear normal.  Mouth/Throat: Oropharynx is clear and moist.  Eyes: Pupils are equal, round, and reactive to light. Conjunctivae and EOM are normal.  Neck: Normal range of motion. Neck supple. No thyromegaly present.  Cardiovascular: Normal rate, regular rhythm and normal heart sounds.  No murmur heard. Respiratory: Effort normal and breath sounds normal.  GI: Soft. Bowel sounds are normal.  Neurological: She is alert and oriented to person, place, and time. She has normal reflexes.  Skin: Skin is warm and dry.  Psychiatric: She has a normal mood and affect. Her behavior is normal.   Assessment/Plan: Depression screen [facility] 2/9 03/02/2019 09/26/2017 09/01/2017 01/29/2017 09/22/2016  Decreased Interest 0 0 0 0 0  Down, Depressed, Hopeless 0 0 0 0 0  PHQ - 2 Score 0 0 0 0 0   Fall Risk  03/02/2019 09/26/2017 09/01/2017 01/29/2017 09/22/2016  Falls in the past year? 0 No No No No  Number falls in past yr: 0 - - - -  Injury with Fall? 0 - - - -     Assessment and plan  1. Essential hypertension Blood pressure is to goal. Continue current anti-hypertensive medications. Refills given and routine lab work will be done today. Recommended routine exercise and healthy diet including DASH  diet and mediterranean diet. Encouraged weight loss. F/u in 6 months.   - Lipid panel  2. Type 2 diabetes mellitus with  retinopathy of right eye, with long-term current use of insulin, macular edema presence unspecified, unspecified retinopathy severity ([place]) Stopped her insulin on her own accord with intermittent fasting. a1c was to goal on last check with insulin. We will see what she is running today. Prefer she f/u with endo if is going to stop her medication. If well controlled we can continue to see her, but I don't like that she just stopped her medication without lettin Dr. Renne Crigler know. Labs today. Declines flu and pneumonia shot. utd on her eye exam. Discussed statin drug and she declines this as well. Precautions given especially for hypoglycemia with her intermittent fasting although hopefully not as big of a risk without all of her insulin. F/u in 3 months. From last note it looks like her insurance didn't cover any of the glp1 drugs.  - Hemoglobin A1c - CBC with Differential/Platelet - Comprehensive metabolic panel - TSH - Microalbumin / creatinine urine ratio - Lipid panel  3. Family history of colon cancer in father REALLY, REALLY needs a colonoscopy. Discussed risk are high with AA ethnicity and family hx in her father. Discussed not a candidate for cologaurd with family history. Worth the payments for the screening. Referral done and advised she see about setting up a payment plan.  - Ambulatory referral to Gastroenterology     Future Appointments  Date Time Provider [place]  06/03/2019  1:20 PM Orma Flaming, MD [facility]   Return in about 3 months (around 06/01/2019) for diabetes .   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 Insulin Lispro (HUMALOG KWIKPEN) 200 UNIT/ML SOPN    Sig: Inject 20-25 Units into the skin daily before breakfast.    Dispense:  10 pen    Refill:  3  . losartan-hydrochlorothiazide (HYZAAR) 100-25 MG tablet    Sig: Take 1 tablet by mouth daily.    Dispense:  90 tablet    Refill:  1    Return precautions advised. Orma Flaming, MD

## 2019-03-02 NOTE — Patient Instructions (Signed)
Labs today. Ordered referral to GI for colonoscopy. Check with insurance and it's worth the monthly payment with your history!   See you back in 3 months!   So nice to meet you!  Dr. Rogers Blocker

## 2019-03-07 ENCOUNTER — Telehealth: Payer: Self-pay | Admitting: Internal Medicine

## 2019-03-07 MED ORDER — ONETOUCH ULTRA VI STRP: ORAL_STRIP | 4 refills | Status: AC

## 2019-03-07 NOTE — Telephone Encounter (Signed)
RX sent

## 2019-03-07 NOTE — Telephone Encounter (Signed)
MEDICATION: Ultra One Touch  PHARMACY:  [facility]  IS THIS A 90 DAY SUPPLY :   IS PATIENT OUT OF MEDICATION:   IF NOT; HOW MUCH IS LEFT:   LAST APPOINTMENT DATE: @9 /09/2018  NEXT APPOINTMENT DATE:@Visit  date not found  DO WE HAVE YOUR PERMISSION TO LEAVE A DETAILED MESSAGE:  OTHER COMMENTS:    **Let patient know to contact pharmacy at the end of the day to make sure medication is ready. **  ** Please notify patient to allow 48-72 hours to process**  **Encourage patient to contact the pharmacy for refills or they can request refills through [facility]**

## 2019-03-08 MED ORDER — ONETOUCH ULTRA 2 W/DEVICE KIT: PACK | 0 refills | Status: AC

## 2019-03-08 NOTE — Addendum Note (Signed)
Addended by: Cardell Peach I on: 03/08/2019 10:33 AM   Modules accepted: Orders

## 2019-03-08 NOTE — Telephone Encounter (Signed)
Message did not specify meter.

## 2019-03-08 NOTE — Telephone Encounter (Signed)
Incorrect Rx sent. Patient requests RX for a new meter to be sent to the [facility] listed below

## 2019-03-08 NOTE — Telephone Encounter (Signed)
Meter sent

## 2019-03-10 ENCOUNTER — Ambulatory Visit (INDEPENDENT_AMBULATORY_CARE_PROVIDER_SITE_OTHER): Payer: 59 | Admitting: Family Medicine

## 2019-03-10 ENCOUNTER — Encounter: Payer: Self-pay | Admitting: Family Medicine

## 2019-03-10 VITALS — Ht 61.5 in | Wt 166.0 lb

## 2019-03-10 DIAGNOSIS — Z794 Long term (current) use of insulin: Secondary | ICD-10-CM

## 2019-03-10 DIAGNOSIS — E11319 Type 2 diabetes mellitus with unspecified diabetic retinopathy without macular edema: Secondary | ICD-10-CM

## 2019-03-10 NOTE — Progress Notes (Signed)
Patient: Kristen [name] MRN: [id number] DOB: [birth year] PCP: Orland Mustard, MD     I connected with Kristen [name] on 03/10/19 at 2:06pm by a video enabled telemedicine application and verified that I am speaking with the correct person using two identifiers.  Location patient: Home Location provider: [facility], Office Persons participating in this virtual visit: Kristen [name] and Dr. Artis Flock   I discussed the limitations of evaluation and management by telemedicine and the availability of in person appointments. The patient expressed understanding and agreed to proceed.   Subjective:  Chief Complaint  Patient presents with  . discuss lab results  . Diabetes    HPI: The patient is a 68 y.o. female who presents today for follow up on diabetes. She was a new patient that was being followed by endocrine. She recently decided to do intermittent fasting and stopped her diabetic medication. A1c Was 7.6. She can not tolerate metformin and GLP-1 drugs were too expensive and insurance did not cover any of them. She is still taking her jardiance.  She tells me today she has really been working hard on diet. Sugars are as follows:  Fasting: 114-130 Pre prandial: 114-120 2 hour post prandial: 200 She also still takes her novolog prior to a meal with carbs. She is still doing intermittent fasting with more weight loss. She is down to 166.   -also discussed statin medication again. She still declines.   Review of Systems  Constitutional: Negative for chills, fatigue and fever.  HENT: Negative for dental problem, ear pain, hearing loss and trouble swallowing.   Eyes: Negative for visual disturbance.  Respiratory: Negative for cough, chest tightness and shortness of breath.   Cardiovascular: Negative for chest pain, palpitations and leg swelling.  Gastrointestinal: Negative for abdominal pain, blood in stool, diarrhea, nausea and vomiting.  Endocrine: Negative for cold intolerance, polydipsia,  polyphagia and polyuria.  Genitourinary: Negative for dysuria and hematuria.  Musculoskeletal: Negative for arthralgias.  Skin: Negative for rash.  Neurological: Negative for dizziness and headaches.  Psychiatric/Behavioral: Negative for dysphoric mood and sleep disturbance. The patient is not nervous/anxious.     Allergies Patient is allergic to latex.  Past Medical History Patient  has a past medical history of Colon polyps, Diabetes mellitus, Glaucoma, History of chicken pox, HTN (hypertension), and Meralgia paresthetica.  Surgical History Patient  has a past surgical history that includes Breast cyst excision; Tonsillectomy and adenoidectomy; and Cholecystectomy (06/16/2008).  Family History Pateint's family history includes Cancer in her father; Diabetes in her father, mother, and sister; Hypertension in her mother; Thyroid disease in her mother.  Social History Patient  reports that she has never smoked. She has never used smokeless tobacco. She reports that she does not drink alcohol or use drugs.    Objective: Vitals:   03/10/19 1352  Weight: 166 lb (75.3 kg)  Height: 5' 1.5" (1.562 m)    Body mass index is 30.86 kg/m.  Physical Exam Vitals signs reviewed.  Constitutional:      Appearance: Normal appearance.  HENT:     Head: Normocephalic and atraumatic.  Pulmonary:     Effort: Pulmonary effort is normal.  Neurological:     General: No focal deficit present.     Mental Status: She is alert and oriented to person, place, and time.  Psychiatric:        Mood and Affect: Mood normal.        Behavior: Behavior normal.        Assessment/plan: 1. Type  2 diabetes mellitus with right eye affected by retinopathy without macular edema, with long-term current use of insulin, unspecified retinopathy severity ([place]) We are going to give her 3 months to continue her diet and jardiance alone. Did discuss I have some concerns as intermittent fasting is not a lifelong diet.  Also have concerns that her a1c may continue to go up, but her sugars are reasonable. She is continuing to lose weight and will start back with her exercise. She is determined to stay off insulin. Discussed I will give her 3 months to do this and see her back with labs and appointment at that time. I am cautiously optimistic. Again discussed statins and she again refuses.  Hypoglycemic precautions given.    Return in about 3 months (around 06/09/2019).   Orma Flaming, MD [place]  03/10/2019

## 2019-03-22 ENCOUNTER — Other Ambulatory Visit: Payer: Self-pay

## 2019-03-22 MED ORDER — INSULIN GLARGINE 100 UNIT/ML SOLOSTAR PEN: PEN_INJECTOR | SUBCUTANEOUS | 11 refills | Status: DC

## 2019-05-02 ENCOUNTER — Encounter: Payer: Self-pay | Admitting: Internal Medicine

## 2019-06-03 ENCOUNTER — Ambulatory Visit: Payer: 59 | Admitting: Family Medicine

## 2019-07-12 ENCOUNTER — Other Ambulatory Visit: Payer: Self-pay | Admitting: Family Medicine

## 2019-10-01 ENCOUNTER — Other Ambulatory Visit: Payer: Self-pay | Admitting: Internal Medicine

## 2019-10-17 ENCOUNTER — Other Ambulatory Visit: Payer: Self-pay | Admitting: Internal Medicine

## 2019-11-24 ENCOUNTER — Ambulatory Visit: Payer: 59 | Admitting: Family Medicine

## 2019-11-24 ENCOUNTER — Other Ambulatory Visit: Payer: Self-pay

## 2019-11-24 ENCOUNTER — Encounter: Payer: Self-pay | Admitting: Family Medicine

## 2019-11-24 VITALS — BP 138/82 | HR 65 | Temp 97.9°F | Ht 61.5 in | Wt 184.4 lb

## 2019-11-24 DIAGNOSIS — I10 Essential (primary) hypertension: Secondary | ICD-10-CM

## 2019-11-24 DIAGNOSIS — E559 Vitamin D deficiency, unspecified: Secondary | ICD-10-CM | POA: Diagnosis not present

## 2019-11-24 DIAGNOSIS — E11311 Type 2 diabetes mellitus with unspecified diabetic retinopathy with macular edema: Secondary | ICD-10-CM

## 2019-11-24 NOTE — Progress Notes (Signed)
Patient: Kristen [name] MRN: [id number] DOB: 1950/08/07 PCP: Kristen Mustard, MD     Subjective:  Chief Complaint  Patient presents with  . Diabetes  . Hypertension    HPI: The patient is a 69 y.o. female who presents today for Diabetes follow up.  Diabetes: Patient is here for follow up of type 2 diabetes. Currently on the following medications jardiance 25mg , lantus 40 units qAm and 30 units in the pm and humalog q meals . Takes medications as prescribed. Last A1C was 7.6 (8 months ago). Currently exercising and following diabetic diet. Sugars range from 100 to 130. Denies any hypoglycemic events. Denies any vision changes, nausea, vomiting, abdominal pain, ulcers/paraesthesia in feet, polyuria, polydipsia or polyphagia. Denies any chest pain, shortness of breath.  -followed regularly for eyes -declines statin -has had her covid vaccines -a1c today.   Hypertension: Here for follow up of hypertension.  Currently on hyzaar (losartan-hctz 100-25mg ) . Home readings range from 120 systolic/60-70 diastolic. Takes medication as prescribed and denies any side effects. Exercise includes walking. Weight has been stable. Denies any chest pain, headaches, shortness of breath, vision changes, swelling in lower extremities.   She is also trying to get her colonoscopy set up. She has this with [facility].   Review of Systems  Cardiovascular: Negative for chest pain and palpitations.  Gastrointestinal: Positive for diarrhea. Negative for abdominal pain and vomiting.  Genitourinary: Negative for frequency, pelvic pain and urgency.  Neurological: Negative for dizziness, light-headedness and headaches.    Allergies Patient is allergic to latex.  Past Medical History Patient  has a past medical history of Colon polyps, Diabetes mellitus, Glaucoma, History of chicken pox, HTN (hypertension), and Meralgia paresthetica.  Surgical History Patient  has a past surgical history that includes Breast cyst  excision; Tonsillectomy and adenoidectomy; and Cholecystectomy (06/16/2008).  Family History Pateint's family history includes Cancer in her father; Diabetes in her father, mother, and sister; Hypertension in her mother; Thyroid disease in her mother.  Social History Patient  reports that she has never smoked. She has never used smokeless tobacco. She reports that she does not drink alcohol and does not use drugs.    Objective: Vitals:   11/24/19 1425  BP: 138/82  Pulse: 65  Temp: 97.9 F (36.6 C)  TempSrc: Temporal  SpO2: 95%  Weight: 184 lb 6.4 oz (83.6 kg)  Height: 5' 1.5" (1.562 m)    Body mass index is 34.28 kg/m.  Physical Exam Vitals reviewed.  Constitutional:      Appearance: Normal appearance. She is obese.  HENT:     Head: Normocephalic and atraumatic.  Neck:     Vascular: No carotid bruit.  Cardiovascular:     Rate and Rhythm: Normal rate and regular rhythm.     Heart sounds: Normal heart sounds.  Pulmonary:     Effort: Pulmonary effort is normal.     Breath sounds: Normal breath sounds.  Abdominal:     General: Bowel sounds are normal.     Palpations: Abdomen is soft.  Skin:    Capillary Refill: Capillary refill takes less than 2 seconds.  Neurological:     General: No focal deficit present.     Mental Status: She is alert and oriented to person, place, and time.  Psychiatric:        Mood and Affect: Mood normal.        Behavior: Behavior normal.      Office Visit from 11/24/2019 in [facility]  PHQ-2 Total Score 0         Assessment/plan: 1. Essential hypertension Blood pressure is to goal. Continue current anti-hypertensive medications. Refills not given and routine lab work will be done today. Recommended routine exercise and healthy diet including DASH diet and mediterranean diet. Encouraged weight loss. F/u in 6 months.    2. Vitamin D deficiency   3. Type 2 diabetes mellitus with right eye affected by retinopathy  and macular edema, without long-term current use of insulin, unspecified retinopathy severity ([facility]) Has not seen Dr. Renne Crigler in over a year. Labs way overdue. She wasn't on insulin when I saw her September, but is taking this now. Will check labs and advised she see Dr. Renne Crigler asap.  -continue jardiance -continue lantus and humalog -continues to decline statin -utd on eye exam   -trying to set up her colonoscopy.    This visit occurred during the SARS-CoV-2 public health emergency.  Safety protocols were in place, including screening questions prior to the visit, additional usage of staff PPE, and extensive cleaning of exam room while observing appropriate contact time as indicated for disinfecting solutions.      Return in about 6 months (around 05/25/2020) for htn. and make sure and see Dr. Renne Crigler .    Orma Flaming, MD [place]   11/24/2019

## 2019-11-25 LAB — CBC WITH DIFFERENTIAL/PLATELET
Basophils Absolute: 0.1 10*3/uL (ref 0.0–0.1)
Basophils Relative: 0.9 % (ref 0.0–3.0)
Eosinophils Absolute: 0.2 10*3/uL (ref 0.0–0.7)
Eosinophils Relative: 3.6 % (ref 0.0–5.0)
HCT: 43.4 % (ref 36.0–46.0)
Hemoglobin: 14.4 g/dL (ref 12.0–15.0)
Lymphocytes Relative: 35.6 % (ref 12.0–46.0)
Lymphs Abs: 2.2 10*3/uL (ref 0.7–4.0)
MCHC: 33.1 g/dL (ref 30.0–36.0)
MCV: 91.9 fl (ref 78.0–100.0)
Monocytes Absolute: 0.5 10*3/uL (ref 0.1–1.0)
Monocytes Relative: 7.6 % (ref 3.0–12.0)
Neutro Abs: 3.2 10*3/uL (ref 1.4–7.7)
Neutrophils Relative %: 52.3 % (ref 43.0–77.0)
Platelets: 154 10*3/uL (ref 150.0–400.0)
RBC: 4.73 Mil/uL (ref 3.87–5.11)
RDW: 13.5 % (ref 11.5–15.5)
WBC: 6.1 10*3/uL (ref 4.0–10.5)

## 2019-11-25 LAB — LIPID PANEL
Cholesterol: 144 mg/dL (ref 0–200)
HDL: 51.3 mg/dL (ref >39.00)
LDL Cholesterol: 77 mg/dL (ref 0–99)
NonHDL: 93.06
Total CHOL/HDL Ratio: 3
Triglycerides: 82 mg/dL (ref 0.0–149.0)
VLDL: 16.4 mg/dL (ref 0.0–40.0)

## 2019-11-25 LAB — COMPREHENSIVE METABOLIC PANEL
ALT: 35 U/L (ref 0–35)
AST: 29 U/L (ref 0–37)
Albumin: 4.5 g/dL (ref 3.5–5.2)
Alkaline Phosphatase: 77 U/L (ref 39–117)
BUN: 12 mg/dL (ref 6–23)
CO2: 28 mEq/L (ref 19–32)
Calcium: 9.4 mg/dL (ref 8.4–10.5)
Chloride: 102 mEq/L (ref 96–112)
Creatinine, Ser: 0.6 mg/dL (ref 0.40–1.20)
GFR: 120.13 mL/min (ref >60.00)
Glucose, Bld: 74 mg/dL (ref 70–99)
Potassium: 4.1 mEq/L (ref 3.5–5.1)
Sodium: 136 mEq/L (ref 135–145)
Total Bilirubin: 0.5 mg/dL (ref 0.2–1.2)
Total Protein: 6.8 g/dL (ref 6.0–8.3)

## 2019-11-25 LAB — MICROALBUMIN / CREATININE URINE RATIO
Creatinine,U: 40 mg/dL
Microalb Creat Ratio: 1.8 mg/g (ref 0.0–30.0)
Microalb, Ur: <0.7 mg/dL (ref 0.0–1.9)

## 2019-11-25 LAB — HEMOGLOBIN A1C: Hgb A1c MFr Bld: 7.7 % — ABNORMAL HIGH (ref 4.6–6.5)

## 2019-11-25 LAB — VITAMIN D 25 HYDROXY (VIT D DEFICIENCY, FRACTURES): VITD: 36.14 ng/mL (ref 30.00–100.00)

## 2019-12-09 ENCOUNTER — Telehealth: Payer: Self-pay

## 2019-12-09 NOTE — Telephone Encounter (Signed)
Pt called requesting test results from her most recent test.

## 2019-12-16 NOTE — Telephone Encounter (Signed)
I called pt to give her lab results. Pt expressed understanding and will follow up with Diabetes Dr.

## 2020-01-27 LAB — HM COLONOSCOPY

## 2020-02-08 ENCOUNTER — Encounter: Payer: Self-pay | Admitting: Family Medicine

## 2020-02-19 ENCOUNTER — Other Ambulatory Visit: Payer: Self-pay | Admitting: Internal Medicine

## 2020-04-03 ENCOUNTER — Other Ambulatory Visit: Payer: Self-pay | Admitting: Family Medicine

## 2020-04-17 ENCOUNTER — Encounter: Payer: Self-pay | Admitting: Internal Medicine

## 2020-04-18 ENCOUNTER — Encounter: Payer: Self-pay | Admitting: Internal Medicine

## 2020-04-18 ENCOUNTER — Other Ambulatory Visit: Payer: Self-pay | Admitting: Internal Medicine

## 2020-04-18 ENCOUNTER — Telehealth: Payer: Self-pay | Admitting: Internal Medicine

## 2020-04-18 ENCOUNTER — Other Ambulatory Visit: Payer: Self-pay

## 2020-04-18 ENCOUNTER — Ambulatory Visit: Payer: 59 | Admitting: Internal Medicine

## 2020-04-18 VITALS — BP 130/82 | HR 67 | Ht 61.0 in | Wt 183.2 lb

## 2020-04-18 DIAGNOSIS — E11319 Type 2 diabetes mellitus with unspecified diabetic retinopathy without macular edema: Secondary | ICD-10-CM | POA: Diagnosis not present

## 2020-04-18 DIAGNOSIS — E669 Obesity, unspecified: Secondary | ICD-10-CM | POA: Diagnosis not present

## 2020-04-18 DIAGNOSIS — Z794 Long term (current) use of insulin: Secondary | ICD-10-CM | POA: Diagnosis not present

## 2020-04-18 LAB — POCT GLYCOSYLATED HEMOGLOBIN (HGB A1C): Hemoglobin A1C: 7.3 % — AB (ref 4.0–5.6)

## 2020-04-18 MED ORDER — FREESTYLE LIBRE 14 DAY SENSOR MISC: 1.0000 | 3 refills | Status: DC

## 2020-04-18 MED ORDER — FREESTYLE LIBRE 14 DAY READER DEVI: 1.0000 | Freq: Once | 0 refills | Status: AC

## 2020-04-18 MED ORDER — OZEMPIC (0.25 OR 0.5 MG/DOSE) 2 MG/1.5ML ~~LOC~~ SOPN: 0.5000 mg | PEN_INJECTOR | SUBCUTANEOUS | 3 refills | Status: DC

## 2020-04-18 NOTE — Telephone Encounter (Signed)
Completed PA's for both, sent to covermymeds waiting for response.

## 2020-04-18 NOTE — Progress Notes (Signed)
Patient ID: Kristen [name], female   DOB: 07-26-1950, 69 y.o.   MRN: [id number]  This visit occurred during the SARS-CoV-2 public health emergency.  Safety protocols were in place, including screening questions prior to the visit, additional usage of staff PPE, and extensive cleaning of exam room while observing appropriate contact time as indicated for disinfecting solutions.   HPI: Kristen [name] is a 69 y.o.-year-old female, returning for f/u for DM2, dx in 2000, insulin-dependent since 2012, uncontrolled, with complications (DR). Last visit 1.5 years ago (virtual).  In 2019, she started to exercise and also cut down on her carbs and was eating more plant-based foods.  Sugars improved significantly.  However, she was lost for follow-up after visit from 09/2018 and sugars worsened afterwards. Recently, she started to do intermittent fasting and feels that her sugars did improve afterwards.  Reviewed HbA1c levels: Lab Results  Component Value Date   HGBA1C 7.7 (H) 11/24/2019   HGBA1C 7.6 (H) 03/02/2019   HGBA1C 6.7 (A) 06/10/2018   HGBA1C 6.9 (A) 02/25/2018   HGBA1C 8.6 (A) 10/22/2017   HGBA1C 8.4 07/24/2017   HGBA1C 9.4 04/23/2017   HGBA1C 8.7 11/21/2016   HGBA1C 8.5 08/22/2016   HGBA1C 9.3 04/09/2016   HGBA1C 9.3 10/30/2015   HGBA1C 8.6 07/18/2015   HGBA1C 9.9 04/16/2015   HGBA1C 9.5 09/21/2014   HGBA1C 11.4 05/18/2014   HGBA1C 9.4 08/26/2013   HGBA1C 9.6 11/24/2012   HGBA1C 9.4 09/08/2012   HGBA1C 8.4 01/08/2012   She is on: - Basaglar 45 >> Lantus 40 units in the morning - Humalog 20 units 2x a day  before meals >> only in a.m., with breakfast - numbness in hands - Jardiance 25 mg before breakfast She stopped  Victoza b/c $$$. Trulicity not covered. She was on Jardiance 25 mg daily before b'fast. - added 03/2016 - stopped 06/2016  >> sugars increased >> at last visit, I advised her to restart >> did not do so as this was expensive. She tried Metformin ER 500 mg >> still  N/D/AP.  Pt checks her sugars 3-4x a day: - am: 110-152 >> 75-150 >> 75, 100-130 >> 90-140 - 2h after b'fast:  152  >> n/c >> 267 >> n/c - before lunch: n/c >> 140s >> 130-135 >> 130 (w/o Humalog) - 2h after lunch: n/c >> 165-200 >> 130-165, 180 >> 161-201 - before dinner:  73, 110-173 >> n/c >> 125-132 >> n/c - 2h after dinner: 280-295 >> n/c  - bedtime: 124,  194 >> 175-200 >> 173, 180 >> 160s - nighttime: 65 >> n/c Lowest 40s (exercise) >> 75 >> 90; she has hypoglycemia awareness in the 70s. Highest sugar was 295 (party) >> 240 >> 201.  Glucometer: AccuChek  She does intermittent fasting. Meals: - brunch: grits + meat + 2 eggs - +/- snack tuna, boiled egg - dinner: meat + 3 veggies  -No CKD, last BUN/creatinine:  Lab Results  Component Value Date   BUN 12 11/24/2019   CREATININE 0.60 11/24/2019  On losartan. -+ HL; last set of lipids: Lab Results  Component Value Date   CHOL 144 11/24/2019   HDL 51.30 11/24/2019   LDLCALC 77 11/24/2019   TRIG 82.0 11/24/2019   CHOLHDL 3 11/24/2019  Not on a statin. - last eye exam was in 03/2020: + DR - improved, + glaucoma, + small cataract. Dr. Phineas Real ([place].).  She also sees a retina specialist. -She denies numbness and tingling in her feet.  She  also has GERD, HTN.  ROS: Constitutional: no weight gain/no weight loss, no fatigue, no subjective hyperthermia, no subjective hypothermia Eyes: no blurry vision, no xerophthalmia ENT: no sore throat, no nodules palpated in neck, no dysphagia, no odynophagia, no hoarseness Cardiovascular: no CP/no SOB/no palpitations/no leg swelling Respiratory: no cough/no SOB/no wheezing Gastrointestinal: no N/no V/no D/no C/no acid reflux Musculoskeletal: no muscle aches/no joint aches Skin: no rashes, no hair loss Neurological: no tremors/no numbness/no tingling/no dizziness  I reviewed pt's medications, allergies, PMH, social hx, family hx, and changes were documented in the history  of present illness. Otherwise, unchanged from my initial visit note.  Past Medical History:  Diagnosis Date  . Colon polyps   . Diabetes mellitus   . Glaucoma   . History of chicken pox   . HTN (hypertension)   . Meralgia paresthetica    Past Surgical History:  Procedure Laterality Date  . BREAST CYST EXCISION    . CHOLECYSTECTOMY  06/16/2008  . TONSILLECTOMY AND ADENOIDECTOMY     Social History   Social History  . Marital Status: Widowed    Spouse Name: N/A  . Number of Children: 1   Occupational History  .    Social History Main Topics  . Smoking status: Never Smoker   . Smokeless tobacco: Not on file  . Alcohol Use: No  . Drug Use: No   Current Outpatient Medications on File Prior to Visit  Medication Sig Dispense Refill  . albuterol (PROVENTIL HFA;VENTOLIN HFA) 108 (90 Base) MCG/ACT inhaler Inhale 1-2 puffs into the lungs every 4 (four) hours as needed for wheezing or shortness of breath. 1 Inhaler 0  . Ascorbic Acid (VITAMIN C) 1000 MG tablet Take 1,000 mg by mouth daily.    Marland Kitchen aspirin EC 81 MG tablet Take 81 mg by mouth daily.    . Blood Glucose Monitoring Suppl (ONE TOUCH ULTRA 2) w/Device KIT Use to check blood sugar 3 times daily 1 kit 0  . empagliflozin (JARDIANCE) 25 MG TABS tablet Take 25 mg by mouth daily. 90 tablet 3  . glucose blood (ONETOUCH ULTRA) test strip USE AS INSTRUCTED TO CHECK  SUGAR 3 TIMES DAILY 300 strip 4  . HUMALOG KWIKPEN 200 UNIT/ML KwikPen INJECT 20 TO 25 UNITS INTO  THE SKIN DAILY BEFORE  BREAKFAST 12 mL 3  . insulin glargine (LANTUS SOLOSTAR) 100 UNIT/ML Solostar Pen INJECT SUBCUTANEOUSLY 40  UNITS DAILY. NO FURTHER REFILLS WITHOUT APPOINTMENT 45 mL 0  . Insulin Pen Needle (PEN NEEDLES) 31G X 6 MM MISC 2 each by Does not apply route daily. 100 each 11  . Lancets (ONETOUCH DELICA PLUS [id number]) MISC TEST 3 TIMES DAILY 300 each 1  . losartan-hydrochlorothiazide (HYZAAR) 100-25 MG tablet TAKE 1 TABLET BY MOUTH  DAILY 90 tablet 3  . magnesium  30 MG tablet Take 30 mg by mouth 2 (two) times daily.    . Multiple Vitamin (MULTIVITAMIN) tablet Take 1 tablet by mouth daily.    . Turmeric (QC TUMERIC COMPLEX PO) Take by mouth.    . vitamin B-12 (CYANOCOBALAMIN) 100 MCG tablet Take 100 mcg by mouth daily.    . Vitamin D, Cholecalciferol, 1000 UNITS TABS Take 2,000 Units by mouth daily. otc     No current facility-administered medications on file prior to visit.   Allergies  Allergen Reactions  . Latex Hives    itching   Family History  Problem Relation Age of Onset  . Hypertension Mother   . Diabetes Mother   .  Thyroid disease Mother   . Diabetes Father   . Cancer Father        Colon cancer  . Diabetes Sister    PE: BP 130/82   Pulse 67   Ht _0  (1.549 m)   Wt 183 lb 3.2 oz (83.1 kg)   LMP  (LMP Unknown)   SpO2 99%   BMI 34.62 kg/m  Body mass index is 34.62 kg/m. Wt Readings from Last 3 Encounters:  04/18/20 183 lb 3.2 oz (83.1 kg)  11/24/19 184 lb 6.4 oz (83.6 kg)  03/10/19 166 lb (75.3 kg)   Constitutional: overweight, in NAD Eyes: PERRLA, EOMI, no exophthalmos ENT: moist mucous membranes, no thyromegaly, no cervical lymphadenopathy Cardiovascular: RRR, No MRG Respiratory: CTA B Gastrointestinal: abdomen soft, NT, ND, BS+ Musculoskeletal: no deformities, strength intact in all 4 Skin: moist, warm, no rashes Neurological: no tremor with outstretched hands, DTR normal in all 4  ASSESSMENT: 1. DM2, insulin-dependent, uncontrolled, with complications - DR  No family history of medullary thyroid cancer or personal history of pancreatitis.  2. Obesity class 1 BMI Classification:  < 18.5 underweight   18.5-24.9 normal weight   25.0-29.9 overweight   30.0-34.9 class I obesity   35.0-39.9 class II obesity   ? 40.0 class III obesity   PLAN:  1. Patient with longstanding, uncontrolled, type 2 diabetes, on basal-bolus insulin regimen and also SGLT2 inhibitor.  Vania Rea is expensive for her.  She was  on Victoza in the past and had to come off due to cost.  At last visit we did not have to restart this since her sugars were still controlled, but we could not check an HbA1c at that time since this was a virtual appointment.  She was lost for follow-up for 1.5 years afterwards. -At last visit, she was doing a good job reducing carbs and eating a more plant-based diet and also exercising.  Sugars were at goal most of the time, with occasional higher blood sugars after dinner, but still in range.  We did discuss about possibly adding back a GLP-1 receptor agonist and I advised her to check with her insurance with this last medication is covered for her.  We reviewed together her latest HbA1c from 11/2019 and this was higher, at 7.7%. -At today's visit, she tells me she is doing intermittent fasting and eats at 7 PM the latest and then eats a late brunch. Her sugars appear to be worsening after brunch without Humalog. She would not want to resume Humalog because she feels that she gets numbness in her hands when she takes it. At this visit, I did suggest to start a low-dose Ozempic and increase as tolerated. I am not sure if this is tolerated. She did check with her insurance and she tells me that Trulicity was not covered. In case Ozempic is not covered, we can use a low-dose glipizide before lunch. We can continue Lantus and Jardiance at the same doses for now. - I suggested to:  Patient Instructions  Please continue: - Lantus 40 units in the morning - Jardiance 25 mg before breakfast  Please start : - Ozempic 0.25 mg weekly in a.m. (for example on Sunday morning) x 4 weeks, then increase to 0.5 mg weekly in a.m. if no nausea or hypoglycemia.  Let me know if Ozempic is not covered, in which case, we can start: - Glipizide 5 mg before lunch  Please return in 4 months with your sugar log.   - we  checked her HbA1c: 7.3% (lower) - advised to check sugars at different times of the day - 2x a day,  rotating check times - advised for yearly eye exams >> she is UTD - return to clinic in 4 months        2. Obesity class 1 -Continue SGLT2 inhibitor, which should also help with weight loss -At last 2 visits, I gave her a list of GLP-1 receptor agonist, to check with her insurance whether they are covered.  Discussed about benefits of this class of medications if we can start. At this visit, we will try to start Ozempic. -She is doing intermittent fasting, which we will continue -No significant weight loss since last visit.   Philemon Kingdom, MD PhD [facility]

## 2020-04-18 NOTE — Patient Instructions (Addendum)
Please continue: - Lantus 40 units in the morning - Jardiance 25 mg before breakfast  Please start : - Ozempic 0.25 mg weekly in a.m. (for example on Sunday morning) x 4 weeks, then increase to 0.5 mg weekly in a.m. if no nausea or hypoglycemia.  Let me know if Ozempic is not covered, in which case, we can start: - Glipizide 5 mg before lunch  Please return in 4 months with your sugar log.

## 2020-04-18 NOTE — Telephone Encounter (Signed)
Patient called to advise that she spoke with her insurance company and that the Ozempic and the Free Style sensors and readers will have to have Prior Authorization. Per patient number for prior authorization is [id number]

## 2020-04-19 ENCOUNTER — Other Ambulatory Visit: Payer: Self-pay | Admitting: Internal Medicine

## 2020-04-19 ENCOUNTER — Telehealth: Payer: Self-pay

## 2020-04-19 MED ORDER — FREESTYLE LIBRE 14 DAY READER DEVI: 0 refills | Status: DC

## 2020-04-19 MED ORDER — OZEMPIC (0.25 OR 0.5 MG/DOSE) 2 MG/1.5ML ~~LOC~~ SOPN: 0.5000 mg | PEN_INJECTOR | SUBCUTANEOUS | 3 refills | Status: DC

## 2020-04-19 NOTE — Telephone Encounter (Signed)
Fax received from [employer]- for PA for Ozempic 2 mg/1.21ml- it was approved : Request Reference Number: [id number]. OZEMPIC INJ 2/1.5ML is approved through 04/18/2021. Your patient may now fill this prescription and it will be covered.  Fax received from [employer]- for a PA for [employer] 14 day Reader- it was approved: Request Reference Number: [id number]. FREESTYLE MIS READER is approved through 04/18/2021. Your patient may now fill this prescription and it will be covered.

## 2020-05-15 ENCOUNTER — Other Ambulatory Visit: Payer: Self-pay

## 2020-05-15 ENCOUNTER — Telehealth (INDEPENDENT_AMBULATORY_CARE_PROVIDER_SITE_OTHER): Payer: 59 | Admitting: Family Medicine

## 2020-05-15 DIAGNOSIS — M25551 Pain in right hip: Secondary | ICD-10-CM

## 2020-05-15 NOTE — Patient Instructions (Addendum)
   ---------------------------------------------------------------------------------------------------------------------------      WORK SLIP:  Patient Kristen [name],  02-17-1951, was seen for a medical visit today, 05/15/20.   Sincerely: E-signature: Dr. Kriste Basque, DO [facility] Ph: [phone]   ------------------------------------------------------------------------------------------------------------------------------  Ice twice daily  Can use tylenol or aleve per instructions on the bottle if needed for pain  Topical menthol (tiger balm) or salon paws as needed.   I hope you are feeling better soon!  Seek in person care promptly with your primary care doctor or orthopedic urgent care if your symptoms worsen, new concerns arise or you are not improving with treatment over the next 24 hours.  It was nice to meet you today. I help [facility] out with telemedicine visits on Tuesdays and Thursdays and am available for visits on those days. If you have any concerns or questions following this visit please schedule a follow up visit with your Primary Care doctor or seek care at a local urgent care clinic to avoid delays in care.

## 2020-05-15 NOTE — Progress Notes (Signed)
Virtual Visit via Telephone Note  I connected with Kristen [name] on 05/15/20 at  1:00 PM EST by telephone and verified that I am speaking with the correct person using two identifiers.   I discussed the limitations, risks, security and privacy concerns of performing an evaluation and management service by telephone and the availability of in person appointments. I also discussed with the patient that there may be a patient responsible charge related to this service. The patient expressed understanding and agreed to proceed.  Location patient: home, [place] Location provider: work or home office Participants present for the call: patient, provider Patient did not have a visit with me in the prior 7 days to address this/these issue(s).   History of Present Illness:  Acute telemedicine visit for Hip pain: -Onset: 3-4 days ago after lots of walking with shopping; reports has had this in the past -she did not go to work today or yesterday due to the pain -Symptoms include:R lateral hip pain -feels is doing better today -needs work note that she was seen today -using salon paws as well -Denies: fevers, weakness, numbness, radiation, bowel or bladder incontinence -Has tried: heat and ice have helped some; also tried advil and tylenol - but stopped those as did not want to take too much    Observations/Objective: Patient sounds cheerful and well on the phone. I do not appreciate any SOB. Speech and thought processing are grossly intact. Patient reported vitals:  Assessment and Plan:  Right hip pain  -we discussed possible serious and likely etiologies, options for evaluation and workup, limitations of telemedicine visit vs in person visit, treatment, treatment risks and precautions. Pt prefers to treat via telemedicine empirically rather than in person at this moment.  However, advised if any persistent symptoms, will need an in person evaluation and discussed options.  Since feeling better, she  opted to try ice, Tylenol or Aleve and topical treatment with menthol or Salonpas.  She agrees to seek in person care promptly if worsening or not continuing to improve over the next 24 hours. Work/School slipped offered: provided in patient instructions   -she wanted a note that just said that she was seen today, she is working from home Scheduled follow up with PCP offered: Agrees to follow-up as needed PCP office to schedule if does not receive call back in next 24 hours. Advised to seek prompt in person care if worsening, new symptoms arise, or if is not improving with treatment over the next 24 hours. Advised of options for inperson care in case PCP office not available.  Discussed orthopedic urgent care options.  Did let the patient know that I only do telemedicine shifts for [facility] on Tuesdays and Thursdays and advised a follow up visit with PCP or at an [facility] if has further questions or concerns.   Follow Up Instructions:  I did not refer this patient for an OV with me in the next 24 hours for this/these issue(s).  I discussed the assessment and treatment plan with the patient. The patient was provided an opportunity to ask questions and all were answered. The patient agreed with the plan and demonstrated an understanding of the instructions.   I spent 18 minutes on this encounter.   Terressa Koyanagi, DO

## 2020-06-03 ENCOUNTER — Other Ambulatory Visit: Payer: Self-pay | Admitting: Family Medicine

## 2020-06-05 ENCOUNTER — Other Ambulatory Visit: Payer: Self-pay | Admitting: Internal Medicine

## 2020-08-27 ENCOUNTER — Ambulatory Visit (INDEPENDENT_AMBULATORY_CARE_PROVIDER_SITE_OTHER): Payer: BLUE CROSS/BLUE SHIELD | Admitting: Internal Medicine

## 2020-08-27 ENCOUNTER — Other Ambulatory Visit: Payer: Self-pay

## 2020-08-27 ENCOUNTER — Encounter: Payer: Self-pay | Admitting: Internal Medicine

## 2020-08-27 VITALS — BP 128/80 | HR 60 | Ht 61.0 in | Wt 180.0 lb

## 2020-08-27 DIAGNOSIS — Z794 Long term (current) use of insulin: Secondary | ICD-10-CM | POA: Diagnosis not present

## 2020-08-27 DIAGNOSIS — E11319 Type 2 diabetes mellitus with unspecified diabetic retinopathy without macular edema: Secondary | ICD-10-CM | POA: Diagnosis not present

## 2020-08-27 DIAGNOSIS — E669 Obesity, unspecified: Secondary | ICD-10-CM

## 2020-08-27 LAB — POCT GLYCOSYLATED HEMOGLOBIN (HGB A1C): Hemoglobin A1C: 7.6 % — AB (ref 4.0–5.6)

## 2020-08-27 NOTE — Progress Notes (Signed)
Patient ID: Kristen [name], female   DOB: [birth year], 70 y.o.   MRN: [id number]  This visit occurred during the SARS-CoV-2 public health emergency.  Safety protocols were in place, including screening questions prior to the visit, additional usage of staff PPE, and extensive cleaning of exam room while observing appropriate contact time as indicated for disinfecting solutions.   HPI: Kristen [name] is a 70 y.o.-year-old female, returning for f/u for DM2, dx in 2000, insulin-dependent since 2012, uncontrolled, with complications (DR).  Last visit 4 months ago.  Interim history: In 2019, she started to exercise and also cut down on her carbs and was eating more plant-based foods.  Sugars improved significantly but afterwards she was lost for follow-up and her sugars worsened.  Before last visit, she started to do intermittent fasting. She started to exercise 4 days a week - walking 2 mi a day We started Ozempic at last visit >> had to stop in 06/2020 due to price- she had great results with this and also lost weight. Unfortunately, she also had to stop Jardiance in 07/2020 due to price. However, she is preparing to change her insurance and hopes that these will be covered by the new one.  Reviewed HbA1c levels: Lab Results  Component Value Date   HGBA1C 7.3 (A) 04/18/2020   HGBA1C 7.7 (H) 11/24/2019   HGBA1C 7.6 (H) 03/02/2019   HGBA1C 6.7 (A) 06/10/2018   HGBA1C 6.9 (A) 02/25/2018   HGBA1C 8.6 (A) 10/22/2017   HGBA1C 8.4 07/24/2017   HGBA1C 9.4 04/23/2017   HGBA1C 8.7 11/21/2016   HGBA1C 8.5 08/22/2016   HGBA1C 9.3 04/09/2016   HGBA1C 9.3 10/30/2015   HGBA1C 8.6 07/18/2015   HGBA1C 9.9 04/16/2015   HGBA1C 9.5 09/21/2014   HGBA1C 11.4 05/18/2014   HGBA1C 9.4 08/26/2013   HGBA1C 9.6 11/24/2012   HGBA1C 9.4 09/08/2012   HGBA1C 8.4 01/08/2012   She is on: - Basaglar 45 >> Lantus 40 units in the morning - Humalog 20 units 2x a day  before meals >> restarted with L and D  -  >> off 2/2 $$$  (now off for 3 weeks) -  -started 04/2020 >> off 2/2 $$$ (now off for 3 mo) She stopped  Victoza b/c $$$. Trulicity not covered. She was on Jardiance 25 mg daily before b'fast. - added 03/2016 - stopped 06/2016  >> sugars increased >> at last visit, I advised her to restart >> did not do so as this was expensive. She tried Metformin ER 500 mg >> still N/D/AP.  Pt checks her sugars 3-4 times a day: - am: 110-152 >> 75-150 >> 75, 100-130 >> 90-140 >> 140-160 - 2h after b'fast:  152  >> n/c >> 267 >> n/c - before lunch: n/c >> 140s >> 130-135 >> 130 (w/o Humalog) >> 180 - 2h after lunch: n/c >> 165-200 >> 130-165, 180 >> 161-201 >> n/c - before dinner:  73, 110-173 >> n/c >> 125-132 >> n/c - 2h after dinner: 280-295 >> n/c >>  - bedtime: 124,  194 >> 175-200 >> 173, 180 >> 160s >> n/c - nighttime: 65 >> n/c >> 240-285 Lowest 40s (exercise) >> 75 >> 90 >> 140; she has hypoglycemia awareness in the 70s. Highest sugar was 295 (party) >> 240 >> 201 >> 285.  Glucometer: AccuChek  She does intermittent fasting. Meals: - brunch: grits + meat + 2 eggs - +/- snack tuna, boiled egg - dinner: meat + 3 veggies  -No CKD, last BUN/creatinine:  Lab Results  Component Value Date   BUN 12 11/24/2019   CREATININE 0.60 11/24/2019  On losartan. -+ HL; last set of lipids: Lab Results  Component Value Date   CHOL 144 11/24/2019   HDL 51.30 11/24/2019   LDLCALC 77 11/24/2019   TRIG 82.0 11/24/2019   CHOLHDL 3 11/24/2019  Not on a statin. - last eye exam was in 03/2020: + DR - improved, + glaucoma, + small cataract. Dr. Phineas Real [facility]  She also sees a retina specialist. -She denies numbness and tingling in her feet.  She also has GERD, HTN.  ROS: Constitutional: no weight gain/no weight loss, no fatigue, no subjective hyperthermia, no subjective hypothermia Eyes: no blurry vision, no xerophthalmia ENT: no sore throat, no nodules palpated in neck, no dysphagia, no odynophagia, no  hoarseness Cardiovascular: no CP/no SOB/no palpitations/no leg swelling Respiratory: no cough/no SOB/no wheezing Gastrointestinal: no N/no V/no D/no C/no acid reflux Musculoskeletal: no muscle aches/no joint aches Skin: no rashes, no hair loss Neurological: no tremors/no numbness/no tingling/no dizziness  I reviewed pt's medications, allergies, PMH, social hx, family hx, and changes were documented in the history of present illness. Otherwise, unchanged from my initial visit note.  Past Medical History:  Diagnosis Date  . Colon polyps   . Diabetes mellitus   . Glaucoma   . History of chicken pox   . HTN (hypertension)   . Meralgia paresthetica    Past Surgical History:  Procedure Laterality Date  . BREAST CYST EXCISION    . CHOLECYSTECTOMY  06/16/2008  . TONSILLECTOMY AND ADENOIDECTOMY     Social History   Social History  . Marital Status: Widowed    Spouse Name: N/A  . Number of Children: 1   Occupational History  .    Social History Main Topics  . Smoking status: Never Smoker   . Smokeless tobacco: Not on file  . Alcohol Use: No  . Drug Use: No   Current Outpatient Medications on File Prior to Visit  Medication Sig Dispense Refill  . Ascorbic Acid (VITAMIN C) 1000 MG tablet Take 1,000 mg by mouth daily.    Marland Kitchen aspirin EC 81 MG tablet Take 81 mg by mouth daily.    Jolyne Loa Grape-Goldenseal (BERBERINE COMPLEX PO) Take by mouth.    . Blood Glucose Monitoring Suppl (ONE TOUCH ULTRA 2) w/Device KIT Use to check blood sugar 3 times daily 1 kit 0  . COLLAGEN PO Take by mouth.    Marland Kitchen glucose blood (ONETOUCH ULTRA) test strip USE AS INSTRUCTED TO CHECK  SUGAR 3 TIMES DAILY 300 strip 4  . insulin glargine (LANTUS SOLOSTAR) 100 UNIT/ML Solostar Pen INJECT SUBCUTANEOUSLY 40  UNITS DAILY 45 mL 3  . Insulin Pen Needle (PEN NEEDLES) 31G X 6 MM MISC 2 each by Does not apply route daily. 100 each 11  . Lancets (ONETOUCH DELICA PLUS [id number]) MISC TEST 3 TIMES DAILY 300 each 1   . losartan-hydrochlorothiazide (HYZAAR) 100-25 MG tablet TAKE 1 TABLET BY MOUTH  DAILY 90 tablet 3  . magnesium 30 MG tablet Take 30 mg by mouth 2 (two) times daily.    Marland Kitchen MELATONIN PO Take by mouth.    . Multiple Vitamin (MULTIVITAMIN) tablet Take 1 tablet by mouth daily.    . Saw Palmetto, Serenoa repens, (SAW PALMETTO PO) Take by mouth.    . Turmeric (QC TUMERIC COMPLEX PO) Take by mouth.    . vitamin B-12 (CYANOCOBALAMIN) 100 MCG tablet Take 100 mcg by  mouth daily.    . Vitamin D, Cholecalciferol, 1000 UNITS TABS Take 2,000 Units by mouth daily. otc    . Continuous Blood Gluc Receiver (FREESTYLE LIBRE 14 DAY READER) DEVI Use to check blood sugar daily (Patient not taking: Reported on 08/27/2020) 1 each 0  . Continuous Blood Gluc Sensor (FREESTYLE LIBRE 14 DAY SENSOR) MISC 1 each by Does not apply route every 14 (fourteen) days. Change every 2 weeks (Patient not taking: Reported on 08/27/2020) 6 each 3  . JARDIANCE 25 MG TABS tablet TAKE 1 TABLET BY MOUTH  DAILY. (Patient not taking: Reported on 08/27/2020) 90 tablet 3  . Semaglutide,0.25 or 0.5MG/DOS, (OZEMPIC, 0.25 OR 0.5 MG/DOSE,) 2 MG/1.5ML SOPN Inject 0.5 mg into the skin once a week. (Patient not taking: Reported on 08/27/2020) 4.5 mL 3   No current facility-administered medications on file prior to visit.   Allergies  Allergen Reactions  . Latex Hives    itching   Family History  Problem Relation Age of Onset  . Hypertension Mother   . Diabetes Mother   . Thyroid disease Mother   . Diabetes Father   . Cancer Father        Colon cancer  . Diabetes Sister    PE: BP 128/80 (BP Location: Right Arm, Patient Position: Sitting, Cuff Size: Large)   Pulse 60   Ht 5' 1"  (1.549 m)   Wt 180 lb (81.6 kg)   LMP  (LMP Unknown)   SpO2 99%   BMI 34.01 kg/m  Body mass index is 34.01 kg/m. Wt Readings from Last 3 Encounters:  08/27/20 180 lb (81.6 kg)  04/18/20 183 lb 3.2 oz (83.1 kg)  11/24/19 184 lb 6.4 oz (83.6 kg)    Constitutional: overweight, in NAD Eyes: PERRLA, EOMI, no exophthalmos ENT: moist mucous membranes, no thyromegaly, no cervical lymphadenopathy Cardiovascular: RRR, No MRG Respiratory: CTA B Gastrointestinal: abdomen soft, NT, ND, BS+ Musculoskeletal: no deformities, strength intact in all 4 Skin: moist, warm, no rashes Neurological: no tremor with outstretched hands, DTR normal in all 4  ASSESSMENT: 1. DM2, insulin-dependent, uncontrolled, with complications - DR  No family history of medullary thyroid cancer or personal history of pancreatitis.  2. Obesity class 1 BMI Classification:  < 18.5 underweight   18.5-24.9 normal weight   25.0-29.9 overweight   30.0-34.9 class I obesity   35.0-39.9 class II obesity   ? 40.0 class III obesity   PLAN:  1. Patient with longstanding, uncontrolled, type 2 diabetes, on basal-bolus insulin regimen now after having to stop SGLT2 inhibitor and also GLP-1 receptor agonist due to price.  She was on Victoza in the past but had to come off due to cost.  Trulicity was not covered.  Since last visit, Ozempic was approved for her, but oriented she changed her insurance, so she had to stop in 06/2020.  However, she is preparing to start on the new insurance and she believes that both [place] and Ozempic are covered.  Since she was off these medicines, she restarted Humalog 20 units twice a day before meals. -At today's visit, sugars are higher and will need to increase her Humalog before meals.  However, I advised her to let me know as soon as she changes her insurance so I can call in [place] and [place].  If these are not covered for her, we can try patient assistance.  She absolutely needs to restart these.  She tells me that Ozempic was covering her sugars very well and she  also lost approximately 10 pounds on it. - I suggested to:  Patient Instructions  Please continue: - Lantus 40 units in the morning  Please increase: - Humalog 20-25  units before meals  Please let me know wen we can restart: - Jardiance 25 mg before breakfast - Ozempic 0.5 mg once a week  Please return in 3 months with your sugar log.   -At last visit, HbA1c was lower, at 7.3%.  At today's visit, we checked her HbA1c: 7.6% (higher) - advised to check sugars at different times of the day - 3x a day, rotating check times - advised for yearly eye exams >> she is UTD - return to clinic in 3 months       2. Obesity class 1 -Unfortunately, she is now off SGLT 2 inhibitor and GLP-1 receptor agonist which should also help with weight loss -She also continues intermittent fasting and walking -She lost 10 pounds since last visit but she gained 7 pounds after she had to stop Ozempic.  I am hoping we can restart soon.   Philemon Kingdom, MD PhD [facility] Endocrinology

## 2020-08-27 NOTE — Patient Instructions (Addendum)
Please continue: - Lantus 40 units in the morning  Please increase: - Humalog 20-25 units before meals  Please let me know wen we can restart: - Jardiance 25 mg before breakfast - Ozempic 0.5 mg once a week  Please return in 4 months with your sugar log.

## 2020-09-14 ENCOUNTER — Encounter: Payer: BLUE CROSS/BLUE SHIELD | Admitting: Family Medicine

## 2020-09-17 ENCOUNTER — Telehealth: Payer: Self-pay

## 2020-09-17 MED ORDER — LOSARTAN POTASSIUM-HCTZ 100-25 MG PO TABS: 1.0000 | ORAL_TABLET | Freq: Every day | ORAL | 3 refills | Status: DC

## 2020-09-17 NOTE — Telephone Encounter (Signed)
Patient has been out since Thursday, and is needing a refill.

## 2020-09-17 NOTE — Telephone Encounter (Signed)
MEDICATION: Losartan 25 MG  PHARMACY: [facility]  Comments: Pt called stating she has new insurance and her pharmacy is requiring her provider to send in a new prescription. Please advise.   **Let patient know to contact pharmacy at the end of the day to make sure medication is ready. **  ** Please notify patient to allow 48-72 hours to process**  **Encourage patient to contact the pharmacy for refills or they can request refills through [facility]**

## 2020-09-17 NOTE — Telephone Encounter (Signed)
I sent this in a requested monday. They have [facility] and I sent to mail order.  Orland Mustard, MD [place]

## 2020-09-18 ENCOUNTER — Other Ambulatory Visit: Payer: Self-pay

## 2020-09-18 MED ORDER — LOSARTAN POTASSIUM-HCTZ 100-25 MG PO TABS: 1.0000 | ORAL_TABLET | Freq: Every day | ORAL | 3 refills | Status: DC

## 2020-09-18 NOTE — Telephone Encounter (Signed)
Pt's insurance no longer covers mail order. Resent to [facility]

## 2020-10-03 ENCOUNTER — Encounter: Payer: BLUE CROSS/BLUE SHIELD | Admitting: Family Medicine

## 2020-11-09 ENCOUNTER — Other Ambulatory Visit: Payer: Self-pay

## 2020-11-09 ENCOUNTER — Ambulatory Visit (INDEPENDENT_AMBULATORY_CARE_PROVIDER_SITE_OTHER): Payer: 59 | Admitting: Family Medicine

## 2020-11-09 ENCOUNTER — Encounter: Payer: Self-pay | Admitting: Family Medicine

## 2020-11-09 VITALS — BP 118/78 | HR 63 | Temp 97.7°F | Ht 61.0 in | Wt 177.2 lb

## 2020-11-09 DIAGNOSIS — I10 Essential (primary) hypertension: Secondary | ICD-10-CM | POA: Diagnosis not present

## 2020-11-09 DIAGNOSIS — Z794 Long term (current) use of insulin: Secondary | ICD-10-CM

## 2020-11-09 DIAGNOSIS — E113591 Type 2 diabetes mellitus with proliferative diabetic retinopathy without macular edema, right eye: Secondary | ICD-10-CM

## 2020-11-09 DIAGNOSIS — E559 Vitamin D deficiency, unspecified: Secondary | ICD-10-CM | POA: Diagnosis not present

## 2020-11-09 DIAGNOSIS — Z0000 Encounter for general adult medical examination without abnormal findings: Secondary | ICD-10-CM

## 2020-11-09 DIAGNOSIS — L409 Psoriasis, unspecified: Secondary | ICD-10-CM | POA: Diagnosis not present

## 2020-11-09 MED ORDER — LOSARTAN POTASSIUM 100 MG PO TABS: 100.0000 mg | ORAL_TABLET | Freq: Every day | ORAL | 3 refills | Status: DC

## 2020-11-09 MED ORDER — HYDROCHLOROTHIAZIDE 25 MG PO TABS: 25.0000 mg | ORAL_TABLET | Freq: Every day | ORAL | 3 refills | Status: DC

## 2020-11-09 MED ORDER — TRIAMCINOLONE ACETONIDE 0.1 % EX CREA: 1.0000 "application " | TOPICAL_CREAM | Freq: Two times a day (BID) | CUTANEOUS | 0 refills | Status: DC

## 2020-11-09 NOTE — Patient Instructions (Signed)
Psoriasis Psoriasis is a long-term (chronic) skin condition. It occurs because your body's defense system (immune system) causes skin cells to form too quickly. This causes raised, red patches (plaques) on your skin that look silvery. The patches may be on all areas of your body. They can be any size or shape. Psoriasis can come and go. It can range from mild to very bad. It cannot be passed from one person to another (is not contagious). There is no cure for this condition, but it can be helped with treatment. What are the causes? The cause of psoriasis is not known. Some things can make it worse. These are:  Skin damage, such as cuts, scrapes, sunburn, and dryness.  Not getting enough sunlight.  Some medicines.  Alcohol.  Tobacco.  Stress.  Infections. What increases the risk?  Having a family member with psoriasis.  Being very overweight (obese).  Being 6-75 years old.  Taking certain medicines. What are the signs or symptoms? There are different types of psoriasis. The types are:  Plaque. This is the most common. Symptoms include red, raised patches with a silvery coating. These may be itchy. Your nails may be crumbly or fall off.  Guttate. Symptoms include small red spots on your stomach area, arms, and legs. These may happen after you have been sick, such as with strep throat.  Inverse. Symptoms include patches in your armpits, under your breasts, private areas, or on your butt.  Pustular. Symptoms include pus-filled bumps on the palms of your hands or the soles of your feet. You also may feel very tired, weak, have a fever, and not be hungry.  Erythrodermic. Symptoms include bright red skin that looks burned. You may have a fast heartbeat and a body temperature that is too high or too low. You may be itchy or in pain.  Sebopsoriasis. Symptoms include red patches on your scalp, forehead, and face that are greasy.  Psoriatic arthritis. Symptoms include swollen,  painful joints along with scaly skin patches.   How is this treated? There is no cure for this condition, but treatment can:  Help your skin heal.  Lessen itching and irritation and swelling (inflammation).  Slow the growth of new skin cells.  Help your body's defense system respond better to your skin. Treatment may include:  Creams or ointments.  Light therapy. This may include natural sunlight or light therapy in a doctor's office.  Medicines. These can help your body better manage skin cells. They may be used with light therapy or ointments. Medicines may include pills or injections. You may also get antibiotic medicines if you have an infection. Follow these instructions at home: Skin Care  Apply lotion to your skin as needed. Only use those that your doctor has said are okay.  Apply cool, wet cloths (cold compresses) to the affected areas.  Do not use a hot tub or take hot showers. Use slightly warm, not hot, water when taking showers and baths.  Do not scratch your skin. Lifestyle  Do not use any products that contain nicotine or tobacco, such as cigarettes, e-cigarettes, and chewing tobacco. If you need help quitting, ask your doctor.  Lower your stress.  Keep a healthy weight.  Go out in the sun as told by your doctor. Do not get sunburned.  Join a support group.   Medicines  Take or use over-the-counter and prescription medicines only as told by your doctor.  If you were prescribed an antibiotic medicine, take it as told  by your doctor. Do not stop using the antibiotic even if you start to feel better. Alcohol use If you drink alcohol:  Limit how much you use: ? 0-1 drink a day for women. ? 0-2 drinks a day for men.  Be aware of how much alcohol is in your drink. In the [place] one drink equals one 12 oz bottle of beer (355 mL), one 5 oz glass of wine (148 mL), or one 1 oz glass of hard liquor (44 mL). General instructions  Keep a journal to track the  things that cause symptoms (triggers). Try to avoid these things.  See a counselor if you feel the support would help.  Keep all follow-up visits as told by your doctor. This is important. Contact a doctor if:  You have a fever.  Your pain gets worse.  You have more redness or warmth in the affected areas.  You have new or worse pain or stiffness in your joints.  Your nails start to break easily or pull away from the nail bed.  You feel very sad (depressed). Summary  Psoriasis is a long-term (chronic) skin condition.  There is no cure for this condition, but treatment can help manage it.  Keep a journal to track the things that cause symptoms.  Take or use over-the-counter and prescription medicines only as told by your doctor.  Keep all follow-up visits as told by your doctor. This is important. This information is not intended to replace advice given to you by your health care provider. Make sure you discuss any questions you have with your health care provider. Document Revised: 04/06/2018 Document Reviewed: 04/06/2018 Elsevier Patient Education  [place] 65 Years and Older, Female Preventive care refers to lifestyle choices and visits with your health care provider that can promote health and wellness. This includes:  A yearly physical exam. This is also called an annual wellness visit.  Regular dental and eye exams.  Immunizations.  Screening for certain conditions.  Healthy lifestyle choices, such as: ? Eating a healthy diet. ? Getting regular exercise. ? Not using drugs or products that contain nicotine and tobacco. ? Limiting alcohol use. What can I expect for my preventive care visit? Physical exam Your health care provider will check your:  Height and weight. These may be used to calculate your BMI (body mass index). BMI is a measurement that tells if you are at a healthy weight.  Heart rate and blood pressure.  Body  temperature.  Skin for abnormal spots. Counseling Your health care provider may ask you questions about your:  Past medical problems.  Family's medical history.  Alcohol, tobacco, and drug use.  Emotional well-being.  Home life and relationship well-being.  Sexual activity.  Diet, exercise, and sleep habits.  History of falls.  Memory and ability to understand (cognition).  Work and work Statistician.  Pregnancy and menstrual history.  Access to firearms. What immunizations do I need? Vaccines are usually given at various ages, according to a schedule. Your health care provider will recommend vaccines for you based on your age, medical history, and lifestyle or other factors, such as travel or where you work.   What tests do I need? Blood tests  Lipid and cholesterol levels. These may be checked every 5 years, or more often depending on your overall health.  Hepatitis C test.  Hepatitis B test. Screening  Lung cancer screening. You may have this screening every year starting at age 72  if you have a 30-pack-year history of smoking and currently smoke or have quit within the past 15 years.  Colorectal cancer screening. ? All adults should have this screening starting at age 4 and continuing until age 50. ? Your health care provider may recommend screening at age 77 if you are at increased risk. ? You will have tests every 1-10 years, depending on your results and the type of screening test.  Diabetes screening. ? This is done by checking your blood sugar (glucose) after you have not eaten for a while (fasting). ? You may have this done every 1-3 years.  Mammogram. ? This may be done every 1-2 years. ? Talk with your health care provider about how often you should have regular mammograms.  Abdominal aortic aneurysm (AAA) screening. You may need this if you are a current or former smoker.  BRCA-related cancer screening. This may be done if you have a family  history of breast, ovarian, tubal, or peritoneal cancers. Other tests  STD (sexually transmitted disease) testing, if you are at risk.  Bone density scan. This is done to screen for osteoporosis. You may have this done starting at age 51. Talk with your health care provider about your test results, treatment options, and if necessary, the need for more tests. Follow these instructions at home: Eating and drinking  Eat a diet that includes fresh fruits and vegetables, whole grains, lean protein, and low-fat dairy products. Limit your intake of foods with high amounts of sugar, saturated fats, and salt.  Take vitamin and mineral supplements as recommended by your health care provider.  Do not drink alcohol if your health care provider tells you not to drink.  If you drink alcohol: ? Limit how much you have to 0-1 drink a day. ? Be aware of how much alcohol is in your drink. In the [place] one drink equals one 12 oz bottle of beer (355 mL), one 5 oz glass of wine (148 mL), or one 1 oz glass of hard liquor (44 mL).   Lifestyle  Take daily care of your teeth and gums. Brush your teeth every morning and night with fluoride toothpaste. Floss one time each day.  Stay active. Exercise for at least 30 minutes 5 or more days each week.  Do not use any products that contain nicotine or tobacco, such as cigarettes, e-cigarettes, and chewing tobacco. If you need help quitting, ask your health care provider.  Do not use drugs.  If you are sexually active, practice safe sex. Use a condom or other form of protection in order to prevent STIs (sexually transmitted infections).  Talk with your health care provider about taking a low-dose aspirin or statin.  Find healthy ways to cope with stress, such as: ? Meditation, yoga, or listening to music. ? Journaling. ? Talking to a trusted person. ? Spending time with friends and family. Safety  Always wear your seat belt while driving or riding in a  vehicle.  Do not drive: ? If you have been drinking alcohol. Do not ride with someone who has been drinking. ? When you are tired or distracted. ? While texting.  Wear a helmet and other protective equipment during sports activities.  If you have firearms in your house, make sure you follow all gun safety procedures. What's next?  Visit your health care provider once a year for an annual wellness visit.  Ask your health care provider how often you should have your eyes and teeth checked.  Stay up to date on all vaccines. This information is not intended to replace advice given to you by your health care provider. Make sure you discuss any questions you have with your health care provider. Document Revised: 05/23/2020 Document Reviewed: 05/27/2018 Elsevier Patient Education  2021 [employer].

## 2020-11-09 NOTE — Progress Notes (Signed)
Patient: Kristen [name] MRN: [id number] DOB: 03/05/1951 PCP: Orland Mustard, MD     Subjective:  Chief Complaint  Patient presents with  . Annual Exam  . Hypertension  . Diabetes    HPI: The patient is a 70 y.o. female who presents today for annual exam. She denies any changes to past medical history. There have been no recent hospitalizations. They are not following a well balanced diet and exercise plan. Weight has been stable. Routine f/u of chronic conditions. Also has complaints of skin lesion on back of her scalp.   Hypertension: Here for follow up of hypertension.  Currently on hyzaar 100-25mg . Takes medication as prescribed and denies any side effects. Exercise includes walking. Weight has been stable. Denies any chest pain, headaches, shortness of breath, vision changes, swelling in lower extremities. She does feel like the new px she has is making her urine smell and would like a different blood pressure medication.   She also has a lesion on the back of her head that is plaque like and silver in color. Has been there x 7 months. Has not grown and not on any other part of her body. Sometimes will flake off and is itchy at times.   Type 2 diabetes Followed by endo, but would like a1c checked today.   Immunization History  Administered Date(s) Administered  . PFIZER(Purple Top)SARS-COV-2 Vaccination 07/30/2019, 08/24/2019, 04/02/2020  . Tdap 09/08/2012   Colonoscopy: 01/2020. Repeat in 5 years. Done by dr. Loreta Ave  Mammogram: 08/27/2018 Pap smear: n/a.    Review of Systems  Constitutional: Negative for chills, fatigue and fever.  HENT: Negative for dental problem, ear pain, hearing loss and trouble swallowing.   Eyes: Negative for visual disturbance.  Respiratory: Negative for cough, chest tightness and shortness of breath.   Cardiovascular: Negative for chest pain, palpitations and leg swelling.  Gastrointestinal: Negative for abdominal pain, blood in stool, diarrhea and  nausea.  Endocrine: Negative for cold intolerance, polydipsia, polyphagia and polyuria.  Genitourinary: Negative for dysuria and hematuria.  Musculoskeletal: Negative for arthralgias.  Skin: Positive for rash.  Neurological: Negative for dizziness and headaches.  Psychiatric/Behavioral: Negative for dysphoric mood and sleep disturbance. The patient is not nervous/anxious.     Allergies Patient is allergic to latex.  Past Medical History Patient  has a past medical history of Colon polyps, Diabetes mellitus, Glaucoma, History of chicken pox, HTN (hypertension), and Meralgia paresthetica.  Surgical History Patient  has a past surgical history that includes Breast cyst excision; Tonsillectomy and adenoidectomy; and Cholecystectomy (06/16/2008).  Family History Pateint's family history includes Cancer in her father; Diabetes in her father, mother, and sister; Hypertension in her mother; Thyroid disease in her mother.  Social History Patient  reports that she has never smoked. She has never used smokeless tobacco. She reports that she does not drink alcohol and does not use drugs.    Objective: Vitals:   11/09/20 1407  BP: 118/78  Pulse: 63  Temp: 97.7 F (36.5 C)  TempSrc: Temporal  SpO2: 99%  Weight: 177 lb 3.2 oz (80.4 kg)  Height: 5\' 1"  (1.549 m)    Body mass index is 33.48 kg/m.  Physical Exam Vitals reviewed.  Constitutional:      Appearance: Normal appearance. She is well-developed. She is obese.  HENT:     Head: Normocephalic and atraumatic.     Right Ear: Tympanic membrane, ear canal and external ear normal.     Left Ear: Tympanic membrane, ear canal and external ear  normal.     Mouth/Throat:     Mouth: Mucous membranes are moist.  Eyes:     Extraocular Movements: Extraocular movements intact.     Conjunctiva/sclera: Conjunctivae normal.     Pupils: Pupils are equal, round, and reactive to light.  Neck:     Thyroid: No thyromegaly.     Vascular: No carotid  bruit.  Cardiovascular:     Rate and Rhythm: Normal rate and regular rhythm.     Pulses: Normal pulses.     Heart sounds: Normal heart sounds. No murmur heard.   Pulmonary:     Effort: Pulmonary effort is normal.     Breath sounds: Normal breath sounds.  Abdominal:     General: Bowel sounds are normal. There is no distension.     Palpations: Abdomen is soft.     Tenderness: There is no abdominal tenderness.  Musculoskeletal:     Cervical back: Normal range of motion and neck supple.  Lymphadenopathy:     Cervical: No cervical adenopathy.  Skin:    General: Skin is warm and dry.     Capillary Refill: Capillary refill takes less than 2 seconds.     Findings: Lesion (plaque like silver lesion with scaling on back of scalp ) present. No rash.  Neurological:     General: No focal deficit present.     Mental Status: She is alert and oriented to person, place, and time.     Cranial Nerves: No cranial nerve deficit.     Coordination: Coordination normal.     Deep Tendon Reflexes: Reflexes normal.  Psychiatric:        Mood and Affect: Mood normal.        Behavior: Behavior normal.    Flowsheet Row Office Visit from 11/24/2019 in [facility]  PHQ-2 Total Score 0         Assessment/plan: 1. Annual physical exam Routine fasting labs today. HM reviewed. Declines some vaccines. Encouraged more exercise and diet. Doing well overall. Wants to keep working for 5 more years. Very proud of her. Discussed needs mmg and information given for this. Fu in one year.  Patient counseling [x]    Nutrition: Stressed importance of moderation in sodium/caffeine intake, saturated fat and cholesterol, caloric balance, sufficient intake of fresh fruits, vegetables, fiber, calcium, iron, and 1 mg of folate supplement per day (for females capable of pregnancy).  [x]    Stressed the importance of regular exercise.   []    Substance Abuse: Discussed cessation/primary prevention of  tobacco, alcohol, or other drug use; driving or other dangerous activities under the influence; availability of treatment for abuse.   [x]    Injury prevention: Discussed safety belts, safety helmets, smoke detector, smoking near bedding or upholstery.   [x]    Sexuality: Discussed sexually transmitted diseases, partner selection, use of condoms, avoidance of unintended pregnancy  and contraceptive alternatives.  [x]    Dental health: Discussed importance of regular tooth brushing, flossing, and dental visits.  [x]    Health maintenance and immunizations reviewed. Please refer to Health maintenance section.     2. Primary hypertension Blood pressure is to goal. Continue current anti-hypertensive medications except im going to split up her combo pill to see if it helps with her urine. Sent in separate px for losartan and hctz. Refills given and routine lab work will be done today. Recommended routine exercise and healthy diet including DASH diet and mediterranean diet. Encouraged weight loss. F/u in 6 months.   - CBC  with Differential/Platelet - Comprehensive metabolic panel - Lipid panel - Microalbumin / creatinine urine ratio - TSH  3. Vitamin D deficiency Currently on 3000IU/d3 daily. Will check today.  - VITAMIN D 25 Hydroxy (Vit-D Deficiency, Fractures)  4. Psoriasis Lesion typical for psoriasis. Referral to derm and starting her on low-medium potency topical steroids. Recommended 7 days on and then 7 days off.   Return in about 6 months (around 05/12/2021) for htn with alyssa .     Orland Mustard, MD [facility]  11/09/2020

## 2020-11-09 NOTE — Progress Notes (Deleted)
Patient: Kristen [name] MRN: [id number] DOB: 10-06-1950 PCP: Orland Mustard, MD     Subjective:  Chief Complaint  Patient presents with  . Annual Exam  . Hypertension  . Diabetes    HPI: The patient is a 70 y.o. female who presents today for   Review of Systems  Allergies Patient is allergic to latex.  Past Medical History Patient  has a past medical history of Colon polyps, Diabetes mellitus, Glaucoma, History of chicken pox, HTN (hypertension), and Meralgia paresthetica.  Surgical History Patient  has a past surgical history that includes Breast cyst excision; Tonsillectomy and adenoidectomy; and Cholecystectomy (06/16/2008).  Family History Pateint's family history includes Cancer in her father; Diabetes in her father, mother, and sister; Hypertension in her mother; Thyroid disease in her mother.  Social History Patient  reports that she has never smoked. She has never used smokeless tobacco. She reports that she does not drink alcohol and does not use drugs.    Objective: Vitals:   11/09/20 1407  BP: 118/78  Pulse: 63  Temp: 97.7 F (36.5 C)  TempSrc: Temporal  SpO2: 99%  Weight: 177 lb 3.2 oz (80.4 kg)  Height: 5\' 1"  (1.549 m)    Body mass index is 33.48 kg/m.  Physical Exam     Assessment/plan:      No follow-ups on file.     @AWME @ 11/09/2020

## 2020-11-14 LAB — CBC WITH DIFFERENTIAL/PLATELET
Absolute Monocytes: 450 cells/uL (ref 200–950)
Basophils Absolute: 48 cells/uL (ref 0–200)
Basophils Relative: 0.8 %
Eosinophils Absolute: 150 cells/uL (ref 15–500)
Eosinophils Relative: 2.5 %
HCT: 40 % (ref 35.0–45.0)
Hemoglobin: 12.9 g/dL (ref 11.7–15.5)
Lymphs Abs: 2328 cells/uL (ref 850–3900)
MCH: 29.6 pg (ref 27.0–33.0)
MCHC: 32.3 g/dL (ref 32.0–36.0)
MCV: 91.7 fL (ref 80.0–100.0)
MPV: 11.2 fL (ref 7.5–12.5)
Monocytes Relative: 7.5 %
Neutro Abs: 3024 cells/uL (ref 1500–7800)
Neutrophils Relative %: 50.4 %
Platelets: 185 10*3/uL (ref 140–400)
RBC: 4.36 10*6/uL (ref 3.80–5.10)
RDW: 12 % (ref 11.0–15.0)
Total Lymphocyte: 38.8 %
WBC: 6 10*3/uL (ref 3.8–10.8)

## 2020-11-14 LAB — LIPID PANEL
Cholesterol: 136 mg/dL (ref <200)
HDL: 52 mg/dL (ref >=50)
LDL Cholesterol (Calc): 68 mg/dL (calc)
Non-HDL Cholesterol (Calc): 84 mg/dL (calc) (ref <130)
Total CHOL/HDL Ratio: 2.6 (calc) (ref <5.0)
Triglycerides: 78 mg/dL (ref <150)

## 2020-11-14 LAB — MICROALBUMIN / CREATININE URINE RATIO
Creatinine, Urine: 31 mg/dL (ref 20–275)
Microalb Creat Ratio: 10 mcg/mg creat (ref <30)
Microalb, Ur: 0.3 mg/dL

## 2020-11-14 LAB — COMPREHENSIVE METABOLIC PANEL
AG Ratio: 1.9 (calc) (ref 1.0–2.5)
ALT: 27 U/L (ref 6–29)
AST: 24 U/L (ref 10–35)
Albumin: 4.3 g/dL (ref 3.6–5.1)
Alkaline phosphatase (APISO): 76 U/L (ref 37–153)
BUN: 18 mg/dL (ref 7–25)
CO2: 25 mmol/L (ref 20–32)
Calcium: 9.3 mg/dL (ref 8.6–10.4)
Chloride: 102 mmol/L (ref 98–110)
Creat: 0.59 mg/dL (ref 0.50–0.99)
Globulin: 2.3 g/dL (calc) (ref 1.9–3.7)
Glucose, Bld: 94 mg/dL (ref 65–99)
Potassium: 4.7 mmol/L (ref 3.5–5.3)
Sodium: 137 mmol/L (ref 135–146)
Total Bilirubin: 0.4 mg/dL (ref 0.2–1.2)
Total Protein: 6.6 g/dL (ref 6.1–8.1)

## 2020-11-14 LAB — TEST AUTHORIZATION

## 2020-11-14 LAB — HEMOGLOBIN A1C W/OUT EAG: Hgb A1c MFr Bld: 7.4 % of total Hgb — ABNORMAL HIGH (ref <5.7)

## 2020-11-14 LAB — TSH: TSH: 2.77 mIU/L (ref 0.40–4.50)

## 2020-11-14 LAB — VITAMIN D 25 HYDROXY (VIT D DEFICIENCY, FRACTURES): Vit D, 25-Hydroxy: 29 ng/mL — ABNORMAL LOW (ref 30–100)

## 2020-11-20 ENCOUNTER — Telehealth: Payer: Self-pay | Admitting: Internal Medicine

## 2020-11-20 ENCOUNTER — Telehealth: Payer: Self-pay

## 2020-11-20 NOTE — Telephone Encounter (Signed)
I spoke with the pt to give her result note message. Pt voices understanding and agrees with plan.

## 2020-11-20 NOTE — Telephone Encounter (Signed)
Patient is calling in stating she has some questions about her recent blood work, hasnt heard back about the results and is wondering if someone can give her a call back.

## 2020-11-20 NOTE — Telephone Encounter (Signed)
MEDICATION: JARDIANCE  AND OZEMPIC-Both require PA's per Patient  PHARMACY:   [facility]) - [place], [place] - [address], [address] Phone:  [phone]  Fax:  [phone]      HAS THE PATIENT CONTACTED THEIR PHARMACY?  Yes  IS THIS A 90 DAY SUPPLY : Yes-for both  IS PATIENT OUT OF MEDICATION:  Yes  IF NOT; HOW MUCH IS LEFT: 0  LAST APPOINTMENT DATE: @3 /14/2022  NEXT APPOINTMENT DATE:@6 /13/2022  DO WE HAVE YOUR PERMISSION TO LEAVE A DETAILED MESSAGE?: Yes  OTHER COMMENTS:    **Let patient know to contact pharmacy at the end of the day to make sure medication is ready. **  ** Please notify patient to allow 48-72 hours to process**  **Encourage patient to contact the pharmacy for refills or they can request refills through [facility]**

## 2020-11-26 ENCOUNTER — Ambulatory Visit (INDEPENDENT_AMBULATORY_CARE_PROVIDER_SITE_OTHER): Payer: 59 | Admitting: Internal Medicine

## 2020-11-26 ENCOUNTER — Encounter: Payer: Self-pay | Admitting: Internal Medicine

## 2020-11-26 ENCOUNTER — Other Ambulatory Visit: Payer: Self-pay

## 2020-11-26 VITALS — BP 155/100 | HR 66 | Ht 61.0 in | Wt 176.6 lb

## 2020-11-26 DIAGNOSIS — E11319 Type 2 diabetes mellitus with unspecified diabetic retinopathy without macular edema: Secondary | ICD-10-CM

## 2020-11-26 DIAGNOSIS — E669 Obesity, unspecified: Secondary | ICD-10-CM | POA: Diagnosis not present

## 2020-11-26 DIAGNOSIS — Z794 Long term (current) use of insulin: Secondary | ICD-10-CM | POA: Diagnosis not present

## 2020-11-26 MED ORDER — EMPAGLIFLOZIN 25 MG PO TABS: 25.0000 mg | ORAL_TABLET | Freq: Every day | ORAL | 3 refills | Status: DC

## 2020-11-26 MED ORDER — INSULIN ASPART 100 UNIT/ML FLEXPEN: 15.0000 [IU] | PEN_INJECTOR | Freq: Three times a day (TID) | SUBCUTANEOUS | 3 refills | Status: DC

## 2020-11-26 MED ORDER — OZEMPIC (0.25 OR 0.5 MG/DOSE) 2 MG/1.5ML ~~LOC~~ SOPN: 0.5000 mg | PEN_INJECTOR | SUBCUTANEOUS | 3 refills | Status: DC

## 2020-11-26 MED ORDER — INSULIN PEN NEEDLE 32G X 4 MM MISC: 3 refills | Status: DC

## 2020-11-26 NOTE — Patient Instructions (Addendum)
Please continue: - Lantus 40 units in the morning  Try to change: - Novolog 15-20 units before meals  Please restart: - Jardiance 25 mg before breakfast - Ozempic 0.5 mg once a week  Please return in 3-4 months with your sugar log.

## 2020-11-26 NOTE — Progress Notes (Signed)
Patient ID: Kristen [name], female   DOB: 1950-08-27, 70 y.o.   MRN: [id number]  This visit occurred during the SARS-CoV-2 public health emergency.  Safety protocols were in place, including screening questions prior to the visit, additional usage of staff PPE, and extensive cleaning of exam room while observing appropriate contact time as indicated for disinfecting solutions.   HPI: Kristen [name] is a 70 y.o.-year-old female, returning for f/u for DM2, dx in 2000, insulin-dependent since 2012, uncontrolled, with complications (DR).  Last visit 3 months ago.  Interim history: She continues to walk 2 miles a day 4 days a week. She continues to do intermittent fasting. No increased urination, blurry vision, nausea, chest pain.  Reviewed HbA1c levels: Lab Results  Component Value Date   HGBA1C 7.4 (H) 11/09/2020   HGBA1C 7.6 (A) 08/27/2020   HGBA1C 7.3 (A) 04/18/2020   HGBA1C 7.7 (H) 11/24/2019   HGBA1C 7.6 (H) 03/02/2019   HGBA1C 6.7 (A) 06/10/2018   HGBA1C 6.9 (A) 02/25/2018   HGBA1C 8.6 (A) 10/22/2017   HGBA1C 8.4 07/24/2017   HGBA1C 9.4 04/23/2017   HGBA1C 8.7 11/21/2016   HGBA1C 8.5 08/22/2016   HGBA1C 9.3 04/09/2016   HGBA1C 9.3 10/30/2015   HGBA1C 8.6 07/18/2015   HGBA1C 9.9 04/16/2015   HGBA1C 9.5 09/21/2014   HGBA1C 11.4 05/18/2014   HGBA1C 9.4 08/26/2013   HGBA1C 9.6 11/24/2012   She is on: - Basaglar 45 >> Lantus 40 units in the morning >> 40 units 2x a day started 3 days ago - Humalog 20 units 2x a day  before meals >> 20 to 25 units before L and D  >> ran out 3 days ago - arm numbness resolved -  >> off 2/2 $$$  -  -started 04/2020 >> off 2/2 $$$  She stopped  Victoza b/c $$$. Trulicity not covered. She was on Jardiance 25 mg daily before b'fast. - added 03/2016 - stopped 06/2016  >> sugars increased >> at last visit, I advised her to restart >> did not do so as this was expensive. She tried Metformin ER 500 mg >> still N/D/AP.  Pt checks her sugars 3-4 times a  day: - am: 75-150 >> 75, 100-130 >> 90-140 >> 140-160 >> 109-148 (off Humalog: 168) - 2h after b'fast:  152  >> n/c >> 267 >> n/c - before lunch: 140s >> 130-135 >> 130 (w/o Humalog) >> 180 >>  - 2h after lunch: 165-200 >> 130-165, 180 >> 161-201 >> n/c - before dinner:  73, 110-173 >> n/c >> 125-132 >> n/c - 2h after dinner: 280-295 >> n/c >>  - bedtime: 124,  194 >> 175-200 >> 173, 180 >> 160s >> n/c - nighttime: 65 >> n/c >> 240-285 Lowest 40s (exercise) >> 75 >> 90 >> 140; she has hypoglycemia awareness in the 70s. Highest sugar was 295 (party) >> 240 >> 201 >> 285.  Glucometer: AccuChek  She does intermittent fasting. Meals: - brunch: grits + meat + 2 eggs - +/- snack tuna, boiled egg - dinner: meat + 3 veggies  -No CKD, last BUN/creatinine:  Lab Results  Component Value Date   BUN 18 11/09/2020   CREATININE 0.59 11/09/2020  On losartan. -+ HL; last set of lipids: Lab Results  Component Value Date   CHOL 136 11/09/2020   HDL 52 11/09/2020   LDLCALC 68 11/09/2020   TRIG 78 11/09/2020   CHOLHDL 2.6 11/09/2020  Not on a statin. - last eye exam was in 09/2020: +  DR - improved, + glaucoma, + small cataract. Dr. Phineas Real ([place]  She also sees a retina specialist - [place]. -She denies numbness and tingling in her feet.  She also has GERD, HTN.  ROS: Constitutional: no weight gain/no weight loss, no fatigue, no subjective hyperthermia, no subjective hypothermia Eyes: no blurry vision, no xerophthalmia ENT: no sore throat, no nodules palpated in neck, no dysphagia, no odynophagia, no hoarseness Cardiovascular: no CP/no SOB/no palpitations/no leg swelling Respiratory: no cough/no SOB/no wheezing Gastrointestinal: no N/no V/no D/no C/no acid reflux Musculoskeletal: no muscle aches/no joint aches Skin: no rashes, no hair loss Neurological: no tremors/no numbness/no tingling/no dizziness  I reviewed pt's medications, allergies, PMH, social hx, family hx, and  changes were documented in the history of present illness. Otherwise, unchanged from my initial visit note.  Past Medical History:  Diagnosis Date   Colon polyps    Diabetes mellitus    Glaucoma    History of chicken pox    HTN (hypertension)    Meralgia paresthetica    Past Surgical History:  Procedure Laterality Date   BREAST CYST EXCISION     CHOLECYSTECTOMY  06/16/2008   TONSILLECTOMY AND ADENOIDECTOMY     Social History   Social History   Marital Status: Widowed    Spouse Name: N/A   Number of Children: 1   Occupational History      Social History Main Topics   Smoking status: Never Smoker    Smokeless tobacco: Not on file   Alcohol Use: No   Drug Use: No   Current Outpatient Medications on File Prior to Visit  Medication Sig Dispense Refill   Ascorbic Acid (VITAMIN C) 1000 MG tablet Take 1,000 mg by mouth daily.     aspirin EC 81 MG tablet Take 81 mg by mouth daily.     Barberry-Oreg Grape-Goldenseal (BERBERINE COMPLEX PO) Take by mouth.     Blood Glucose Monitoring Suppl (ONE TOUCH ULTRA 2) w/Device KIT Use to check blood sugar 3 times daily 1 kit 0   COLLAGEN PO Take by mouth.     glucose blood (ONETOUCH ULTRA) test strip USE AS INSTRUCTED TO CHECK  SUGAR 3 TIMES DAILY 300 strip 4   hydrochlorothiazide (HYDRODIURIL) 25 MG tablet Take 1 tablet (25 mg total) by mouth daily. 90 tablet 3   insulin glargine (LANTUS SOLOSTAR) 100 UNIT/ML Solostar Pen INJECT SUBCUTANEOUSLY 40  UNITS DAILY 45 mL 3   Insulin Pen Needle (PEN NEEDLES) 31G X 6 MM MISC 2 each by Does not apply route daily. 100 each 11   Lancets (ONETOUCH DELICA PLUS [id number]) MISC TEST 3 TIMES DAILY 300 each 1   losartan (COZAAR) 100 MG tablet Take 1 tablet (100 mg total) by mouth daily. 90 tablet 3   magnesium 30 MG tablet Take 30 mg by mouth 2 (two) times daily.     MELATONIN PO Take by mouth.     Multiple Vitamin (MULTIVITAMIN) tablet Take 1 tablet by mouth daily.     Saw Palmetto, Serenoa repens, (SAW  PALMETTO PO) Take by mouth.     triamcinolone cream (KENALOG) 0.1 % Apply 1 application topically 2 (two) times daily. Use x 7 days then off x 7 days 60 g 0   Turmeric (QC TUMERIC COMPLEX PO) Take by mouth.     vitamin B-12 (CYANOCOBALAMIN) 100 MCG tablet Take 100 mcg by mouth daily.     Vitamin D, Cholecalciferol, 1000 UNITS TABS Take 2,000 Units by mouth daily. otc  No current facility-administered medications on file prior to visit.   Allergies  Allergen Reactions   Latex Hives    itching   Family History  Problem Relation Age of Onset   Hypertension Mother    Diabetes Mother    Thyroid disease Mother    Diabetes Father    Cancer Father        Colon cancer   Diabetes Sister    PE: LMP  (LMP Unknown)  There is no height or weight on file to calculate BMI. Wt Readings from Last 3 Encounters:  11/09/20 177 lb 3.2 oz (80.4 kg)  08/27/20 180 lb (81.6 kg)  04/18/20 183 lb 3.2 oz (83.1 kg)   Constitutional: overweight, in NAD Eyes: PERRLA, EOMI, no exophthalmos ENT: moist mucous membranes, no thyromegaly, no cervical lymphadenopathy Cardiovascular: RRR, No MRG Respiratory: CTA B Gastrointestinal: abdomen soft, NT, ND, BS+ Musculoskeletal: no deformities, strength intact in all 4 Skin: moist, warm, no rashes Neurological: no tremor with outstretched hands, DTR normal in all 4  ASSESSMENT: 1. DM2, insulin-dependent, uncontrolled, with complications - DR  No family history of medullary thyroid cancer or personal history of pancreatitis.  2. Obesity class 1 BMI Classification: < 18.5 underweight  18.5-24.9 normal weight  25.0-29.9 overweight  30.0-34.9 class I obesity  35.0-39.9 class II obesity  ? 40.0 class III obesity   PLAN:  1. Patient with longstanding, uncontrolled, type 2 diabetes, on basal-bolus insulin regimen after having to stop SGLT2 inhibitor and weekly GLP-1 receptor agonist due to price.  She was also on Victoza in the past but  had to come off due  to cost.  Trulicity was not covered.  Ozempic was approved for her but due to change in insurance she had to stop in 06/2020.  However, at last visit, she was preparing to start a new insurance and she was telling me that Jardiance and Ozempic appears to be covered.  Since sugars were high, we did increase the dose of Humalog before her meals.  HbA1c at last check was 7.4% last month, decreased from 7.6%. -At today's visit, she tells me that she was still not able to start on Jardiance and [place].  She checks with her insurance and she needs a preauthorization for these.  We sent the PA's for both today.  I also gave her a sample pen of Ozempic so that she can start.  She is now off Humalog after she ran out but she would prefer to stay off it as it is causing her arm and hand paresthesias.  I sent a prescription for NovoLog to her pharmacy and we are going to start on the lower dose since we are trying to escalate her regimen by adding Ozempic first and then the [place]. - I suggested to:  Patient Instructions  Please continue: - Lantus 40 units in the morning  Try to change: - Novolog 15-20 units before meals  Please restart: - Jardiance 25 mg before breakfast - Ozempic 0.5 mg once a week  Please return in 3-4 months with your sugar log.  - advised to check sugars at different times of the day - 3x a day, rotating check times - advised for yearly eye exams >> she is UTD - return to clinic in 3-4 months      2. Obesity class 1 -She had to come off Jardiance and Ozempic due to price -At last visit, we discussed about restarting these as she had a change of insurance -She continues  intermittent fasting and walking - lost 4 lbs since last OV but we are now adding back Ozempic and Jardiance and these should also help with weight loss   Philemon Kingdom, MD PhD [facility] End[facility]

## 2020-11-27 NOTE — Telephone Encounter (Signed)
Can we initiate PA's

## 2020-12-13 ENCOUNTER — Telehealth: Payer: Self-pay | Admitting: Internal Medicine

## 2020-12-13 NOTE — Telephone Encounter (Signed)
Patient is stating pharmacy does not have medications sent in needed alternate medications RX sent in on 11-26-2020 Jardiance and novolog please advise

## 2020-12-13 NOTE — Telephone Encounter (Signed)
Called and spoke with she said that Pharmacy has received the orders for medication , however she  was told that her medications needed PA. Pt also stated that  insurance  will pay for insulin Novolog they just the something that  had reaction to Humalog. Can you see if pt needs a PA.

## 2020-12-14 ENCOUNTER — Other Ambulatory Visit: Payer: Self-pay | Admitting: Family Medicine

## 2020-12-14 DIAGNOSIS — Z1231 Encounter for screening mammogram for malignant neoplasm of breast: Secondary | ICD-10-CM

## 2020-12-20 ENCOUNTER — Other Ambulatory Visit: Payer: Self-pay

## 2020-12-20 ENCOUNTER — Ambulatory Visit
Admission: RE | Admit: 2020-12-20 | Discharge: 2020-12-20 | Disposition: A | Discharge status: Home / Self Care | Payer: 59 | Source: Ambulatory Visit | Attending: Family Medicine | Admitting: Family Medicine

## 2020-12-20 DIAGNOSIS — Z1231 Encounter for screening mammogram for malignant neoplasm of breast: Secondary | ICD-10-CM

## 2020-12-31 ENCOUNTER — Telehealth: Payer: Self-pay | Admitting: Internal Medicine

## 2020-12-31 NOTE — Telephone Encounter (Signed)
Forwarded to patient advocate to initiate PA.

## 2020-12-31 NOTE — Telephone Encounter (Signed)
Pt called stating she needs a PA for her Jardiance and Ozempic sent to [facility]

## 2021-01-01 ENCOUNTER — Telehealth: Payer: Self-pay | Admitting: Pharmacy Technician

## 2021-01-01 NOTE — Telephone Encounter (Addendum)
Patient Advocate Encounter   Received notification from [facility] that prior authorization for [facility] AND JARDIANCE is required.   PA submitted on 01/01/2021 [place]:  [facility] [id number] THROUGH 01/01/2022  JARDIANCE [id number] THROUGH 01/01/2022   Status is APPROVED    [facility] will continue to follow.   Netty Starring. Dimas Aguas, CPhT Patient Advocate [facility] Phone: [phone] Fax:  [phone]

## 2021-01-14 ENCOUNTER — Telehealth: Payer: Self-pay | Admitting: Internal Medicine

## 2021-01-14 DIAGNOSIS — E11319 Type 2 diabetes mellitus with unspecified diabetic retinopathy without macular edema: Secondary | ICD-10-CM

## 2021-01-14 DIAGNOSIS — Z794 Long term (current) use of insulin: Secondary | ICD-10-CM

## 2021-01-14 NOTE — Telephone Encounter (Signed)
Pt is needing a new prescription sent to [facility] for her medications  Semaglutide,0.25 or 0.5MG /DOS, (OZEMPIC, 0.25 OR 0.5 MG/DOSE,) 2 MG/1.5ML SOPN As well as Counselling psychologist  [facility]) - [place], [place] - [phone] [address]

## 2021-01-15 ENCOUNTER — Telehealth (INDEPENDENT_AMBULATORY_CARE_PROVIDER_SITE_OTHER): Payer: 59 | Admitting: Family Medicine

## 2021-01-15 ENCOUNTER — Encounter: Payer: Self-pay | Admitting: Family Medicine

## 2021-01-15 VITALS — Temp 96.7°F

## 2021-01-15 DIAGNOSIS — U071 COVID-19: Secondary | ICD-10-CM

## 2021-01-15 MED ORDER — BENZONATATE 100 MG PO CAPS: 100.0000 mg | ORAL_CAPSULE | Freq: Three times a day (TID) | ORAL | 0 refills | Status: DC | PRN

## 2021-01-15 MED ORDER — EMPAGLIFLOZIN 25 MG PO TABS: 25.0000 mg | ORAL_TABLET | Freq: Every day | ORAL | 3 refills | Status: DC

## 2021-01-15 MED ORDER — OZEMPIC (0.25 OR 0.5 MG/DOSE) 2 MG/1.5ML ~~LOC~~ SOPN: 0.5000 mg | PEN_INJECTOR | SUBCUTANEOUS | 3 refills | Status: DC

## 2021-01-15 NOTE — Progress Notes (Signed)
Virtual Visit via Video Note  I connected with Vaughan Basta  on 01/15/21 at  5:40 PM EDT by a video enabled telemedicine application and verified that I am speaking with the correct person using two identifiers.  Location patient: home, [place] Location provider:work or home office Persons participating in the virtual visit: patient, provider  I discussed the limitations of evaluation and management by telemedicine and the availability of in person appointments. The patient expressed understanding and agreed to proceed.   HPI:  Acute telemedicine visit for Covid19: -Onset: 4 days ago; has positive PCR test for covid -Symptoms include: scratchy throat, ran a fever initially, cough, congestion -Denies:fever today, CP, SOB, NVD, inability to eat/drink/get out of bed -Pertinent past medical history:see below -Pertinent medication allergies: Allergies  Allergen Reactions   Humalog [Insulin Lispro] Other (See Comments)    Paresthesia - hands   Latex Hives    itching  -COVID-19 vaccine status: 2 doses and 1 booster  ROS: See pertinent positives and negatives per HPI.  Past Medical History:  Diagnosis Date   Colon polyps    Diabetes mellitus    Glaucoma    History of chicken pox    HTN (hypertension)    Meralgia paresthetica     Past Surgical History:  Procedure Laterality Date   BREAST CYST EXCISION Left    patient states btw [phone]   CHOLECYSTECTOMY  06/16/2008   TONSILLECTOMY AND ADENOIDECTOMY       Current Outpatient Medications:    Ascorbic Acid (VITAMIN C) 1000 MG tablet, Take 4,000 mg by mouth daily., Disp: , Rfl:    Barberry-Oreg Grape-Goldenseal (BERBERINE COMPLEX PO), Take by mouth., Disp: , Rfl:    benzonatate (TESSALON PERLES) 100 MG capsule, Take 1 capsule (100 mg total) by mouth 3 (three) times daily as needed., Disp: 20 capsule, Rfl: 0   Blood Glucose Monitoring Suppl (ONE TOUCH ULTRA 2) w/Device KIT, Use to check blood sugar 3 times daily, Disp: 1 kit, Rfl: 0    COLLAGEN PO, Take by mouth 3 (three) times a week., Disp: , Rfl:    empagliflozin (JARDIANCE) 25 MG TABS tablet, Take 1 tablet (25 mg total) by mouth daily before breakfast., Disp: 90 tablet, Rfl: 3   glucose blood (ONETOUCH ULTRA) test strip, USE AS INSTRUCTED TO CHECK  SUGAR 3 TIMES DAILY, Disp: 300 strip, Rfl: 4   hydrochlorothiazide (HYDRODIURIL) 25 MG tablet, Take 1 tablet (25 mg total) by mouth daily., Disp: 90 tablet, Rfl: 3   insulin glargine (LANTUS SOLOSTAR) 100 UNIT/ML Solostar Pen, INJECT SUBCUTANEOUSLY 40  UNITS DAILY, Disp: 45 mL, Rfl: 3   Insulin Pen Needle 32G X 4 MM MISC, Use 4x a day, Disp: 400 each, Rfl: 3   Lancets (ONETOUCH DELICA PLUS [id number]) MISC, TEST 3 TIMES DAILY, Disp: 300 each, Rfl: 1   losartan (COZAAR) 100 MG tablet, Take 1 tablet (100 mg total) by mouth daily., Disp: 90 tablet, Rfl: 3   magnesium 30 MG tablet, Take 30 mg by mouth 2 (two) times daily., Disp: , Rfl:    MELATONIN PO, Take by mouth., Disp: , Rfl:    Multiple Vitamin (MULTIVITAMIN) tablet, Take 1 tablet by mouth daily., Disp: , Rfl:    OVER THE COUNTER MEDICATION, Reservatol, Disp: , Rfl:    Semaglutide,0.25 or 0.5MG/DOS, (OZEMPIC, 0.25 OR 0.5 MG/DOSE,) 2 MG/1.5ML SOPN, Inject 0.5 mg into the skin once a week., Disp: 4.5 mL, Rfl: 3   triamcinolone cream (KENALOG) 0.1 %, Apply 1 application topically 2 (two) times  daily. Use x 7 days then off x 7 days, Disp: 60 g, Rfl: 0   Turmeric (QC TUMERIC COMPLEX PO), Take by mouth., Disp: , Rfl:    vitamin B-12 (CYANOCOBALAMIN) 100 MCG tablet, Take 100 mcg by mouth daily., Disp: , Rfl:    Vitamin D, Cholecalciferol, 1000 UNITS TABS, Take 4,000 Units by mouth daily. otc, Disp: , Rfl:   EXAM:  VITALS per patient if applicable: T 93.5 BP 940/90  GENERAL: alert, oriented, appears well and in no acute distress  HEENT: atraumatic, conjunttiva clear, no obvious abnormalities on inspection of external nose and ears  NECK: normal movements of the head and  neck  LUNGS: on inspection no signs of respiratory distress, breathing rate appears normal, no obvious gross SOB, gasping or wheezing  CV: no obvious cyanosis  MS: moves all visible extremities without noticeable abnormality  PSYCH/NEURO: pleasant and cooperative, no obvious depression or anxiety, speech and thought processing grossly intact  ASSESSMENT AND PLAN:  Discussed the following assessment and plan:  COVID-19   Discussed treatment options (infusions and oral options and risk of drug interactions), ideal treatment window, potential complications, isolation and precautions for COVID-19.   After lengthy discussion, the patient declined referral for Covid outpatient treatment at this time. The patient did want a prescription for cough, Tessalon Rx sent.  Other symptomatic care measures summarized in patient instructions. Work/School slipped offered: provided in patient instructions   Advised to seek prompt in person care if worsening, new symptoms arise, or if is not improving with treatment. Discussed options for inperson care if PCP office not available. Did let this patient know that I only do telemedicine on Tuesdays and Thursdays for [place]. Advised to schedule follow up visit with PCP or [facility] if any further questions or concerns to avoid delays in care.   I discussed the assessment and treatment plan with the patient. The patient was provided an opportunity to ask questions and all were answered. The patient agreed with the plan and demonstrated an understanding of the instructions.     Lucretia Kern, DO

## 2021-01-15 NOTE — Telephone Encounter (Signed)
Patient called again re:  [facility] did not receive the following RX's  for: empagliflozin (JARDIANCE) 25 MG TABS tablet 90 tablet 3 11/26/2020    Sig - Route: Take 1 tablet (25 mg total) by mouth daily before breakfast. - Oral   Class: No Print     Semaglutide,0.25 or 0.5MG /DOS, (OZEMPIC, 0.25 OR 0.5 MG/DOSE,) 2 MG/1.5ML SOPN 4.5 mL 3 11/26/2020    Sig - Route: Inject 0.5 mg into the skin once a week. - Subcutaneous   Class: No Print     The above RX with refills dated 11/26/20 showing as (No Print)  Please send the RX's listed above asap (due to Patient is out of the above medications) to:  [facility]  [facility]) - [place], [place] - [address]

## 2021-01-15 NOTE — Patient Instructions (Addendum)
   ---------------------------------------------------------------------------------------------------------------------------      WORK SLIP:  Patient Kristen [name],  1950-09-26, was seen for a medical visit today, 01/15/21 . Please excuse from work for a COVID like illness. We advise 10 days minimum from the onset of symptoms (01/12/21) PLUS 1 day of no fever and improved symptoms. Will defer to employer for a sooner return to work if symptoms have resolved, it is greater than 5 days since the positive test and the patient can wear a high-quality, tight fitting mask such as N95 or KN95 at all times for an additional 5 days. Would also suggest COVID19 antigen testing is negative prior to return.  Sincerely: E-signature: Dr. Kriste Basque, DO [facility] Ph: [phone]   ------------------------------------------------------------------------------------------------------------------------------   HOME CARE TIPS:  -I sent the medication(s) we discussed to your pharmacy: Meds ordered this encounter  Medications   benzonatate (TESSALON PERLES) 100 MG capsule    Sig: Take 1 capsule (100 mg total) by mouth 3 (three) times daily as needed.    Dispense:  20 capsule    Refill:  0     -can use tylenol  if needed for fevers, aches and pains per instructions  -can use nasal saline a few times per day if you have nasal congestion; sometimes  a short course of Afrin nasal spray for 3 days can help with symptoms as well  -stay hydrated, drink plenty of fluids and eat small healthy meals - avoid dairy  -can take 1000 IU ( ) Vit D3 and 100-500 mg of Vit C daily per instructions  -If the Covid test is positive, check out the [facility] website for more information on home care, transmission and treatment for COVID19  -follow up with your doctor in 2-3 days unless improving and feeling better  -stay home while sick, except to seek medical care. If you have COVID19, ideally it  would be best to stay home for a full 10 days since the onset of symptoms PLUS one day of no fever and feeling better. Wear a good mask that fits snugly (such as N95 or KN95) if around others to reduce the risk of transmission.  It was nice to meet you today, and I really hope you are feeling better soon. I help [facility] out with telemedicine visits on Tuesdays and Thursdays and am available for visits on those days. If you have any concerns or questions following this visit please schedule a follow up visit with your Primary Care doctor or seek care at a local urgent care clinic to avoid delays in care.    Seek in person care or schedule a follow up video visit promptly if your symptoms worsen, new concerns arise or you are not improving with treatment. Call 911 and/or seek emergency care if your symptoms are severe or life threatening.

## 2021-01-15 NOTE — Telephone Encounter (Signed)
Rx updated and refilled.

## 2021-01-22 ENCOUNTER — Other Ambulatory Visit (HOSPITAL_COMMUNITY): Payer: Self-pay

## 2021-01-22 ENCOUNTER — Other Ambulatory Visit: Payer: Self-pay

## 2021-01-22 DIAGNOSIS — E11319 Type 2 diabetes mellitus with unspecified diabetic retinopathy without macular edema: Secondary | ICD-10-CM

## 2021-01-22 MED ORDER — EMPAGLIFLOZIN 25 MG PO TABS: 25.0000 mg | ORAL_TABLET | Freq: Every day | ORAL | 3 refills | Status: DC

## 2021-03-20 ENCOUNTER — Telehealth: Payer: Self-pay | Admitting: Pharmacy Technician

## 2021-03-20 NOTE — Telephone Encounter (Signed)
Received notification from [employer] regarding a prior authorization for OZEMPIC. Authorization has been APPROVED from 01/01/21 TO 01/01/2022   Authorization # [id number]

## 2021-03-20 NOTE — Telephone Encounter (Signed)
Patient Advocate Encounter   Received notification from [employer] that prior authorization for [facility] is required.   PA submitted on 03/20/21 [id number] Status is pending    [facility] will continue to follow   Montez Morita, CPhT Patient Advocate [facility] Phone: [phone] Fax:  [phone]

## 2021-03-27 ENCOUNTER — Encounter: Payer: 59 | Admitting: Physician Assistant

## 2021-03-28 ENCOUNTER — Ambulatory Visit: Payer: 59 | Admitting: Internal Medicine

## 2021-04-01 ENCOUNTER — Encounter: Payer: 59 | Admitting: Family

## 2021-04-04 ENCOUNTER — Other Ambulatory Visit: Payer: Self-pay

## 2021-04-04 ENCOUNTER — Encounter: Payer: Self-pay | Admitting: Family

## 2021-04-04 ENCOUNTER — Ambulatory Visit (INDEPENDENT_AMBULATORY_CARE_PROVIDER_SITE_OTHER): Payer: 59 | Admitting: Family

## 2021-04-04 VITALS — BP 110/66 | HR 88 | Temp 97.3°F | Ht 61.0 in | Wt 171.0 lb

## 2021-04-04 DIAGNOSIS — R8290 Unspecified abnormal findings in urine: Secondary | ICD-10-CM | POA: Diagnosis not present

## 2021-04-04 DIAGNOSIS — I10 Essential (primary) hypertension: Secondary | ICD-10-CM

## 2021-04-04 LAB — POCT URINALYSIS DIPSTICK
Bilirubin, UA: NEGATIVE
Blood, UA: NEGATIVE
Glucose, UA: POSITIVE — AB
Ketones, UA: NEGATIVE
Leukocytes, UA: NEGATIVE
Nitrite, UA: NEGATIVE
Protein, UA: NEGATIVE
Spec Grav, UA: 1.015 (ref 1.010–1.025)
Urobilinogen, UA: 0.2 E.U./dL
pH, UA: 6 (ref 5.0–8.0)

## 2021-04-04 NOTE — Assessment & Plan Note (Addendum)
BP good today, weight also down 5lbs, started Ozempic about 2 mos ago. Continue Losartan and HCTZ. Will check labs next visit.

## 2021-04-04 NOTE — Progress Notes (Signed)
Subjective:     Patient ID: Kristen [name], female    DOB: 27-Mar-1951, 70 y.o.   MRN: [id number]  Chief Complaint  Patient presents with   Establish Care   Hypertension    6 month follow up    Hypertension Patient is currently maintained on the following medications for blood pressure: HCTZ and Losartan. Failed meds include none.  Patient reports good compliance with blood pressure medications. Patient denies chest pain, shortness of breath or swelling. Last 3 blood pressure readings in our office are as follows: BP Readings from Last 3 Encounters:  04/04/21 110/66  11/26/20 (!) 155/100  11/09/20 118/78   Urinary Tract Infection: Patient complains of abnormal smelling urine She has had symptoms for 2 days. Patient also complains of  frequency . Patient denies back pain, fever, and vaginal discharge. Patient does not have a history of recurrent UTI.  Patient does not have a history of pyelonephritis.    Health Maintenance Due  Topic Date Due   Pneumonia Vaccine 29+ Years old (1 - PCV) Never done   FOOT EXAM  01/29/2018   OPHTHALMOLOGY EXAM  05/16/2018    Past Medical History:  Diagnosis Date   Colon polyps    Diabetes mellitus    Glaucoma    History of chicken pox    HTN (hypertension)    Meralgia paresthetica     Past Surgical History:  Procedure Laterality Date   BREAST CYST EXCISION Left    patient states btw 1973-1974   CHOLECYSTECTOMY  06/16/2008   TONSILLECTOMY AND ADENOIDECTOMY      Outpatient Medications Prior to Visit  Medication Sig Dispense Refill   Ascorbic Acid (VITAMIN C) 1000 MG tablet Take 4,000 mg by mouth daily.     Barberry-Oreg Grape-Goldenseal (BERBERINE COMPLEX PO) Take by mouth.     Blood Glucose Monitoring Suppl (ONE TOUCH ULTRA 2) w/Device KIT Use to check blood sugar 3 times daily 1 kit 0   COLLAGEN PO Take by mouth 3 (three) times a week.     empagliflozin (JARDIANCE) 25 MG TABS tablet Take 1 tablet (25 mg total) by mouth daily before  breakfast. 90 tablet 3   glucose blood (ONETOUCH ULTRA) test strip USE AS INSTRUCTED TO CHECK  SUGAR 3 TIMES DAILY 300 strip 4   hydrochlorothiazide (HYDRODIURIL) 25 MG tablet Take 1 tablet (25 mg total) by mouth daily. 90 tablet 3   insulin glargine (LANTUS SOLOSTAR) 100 UNIT/ML Solostar Pen INJECT SUBCUTANEOUSLY 40  UNITS DAILY 45 mL 3   Insulin Pen Needle 32G X 4 MM MISC Use 4x a day 400 each 3   Lancets (ONETOUCH DELICA PLUS [id number]) MISC TEST 3 TIMES DAILY 300 each 1   losartan (COZAAR) 100 MG tablet Take 1 tablet (100 mg total) by mouth daily. 90 tablet 3   magnesium 30 MG tablet Take 30 mg by mouth 2 (two) times daily.     MELATONIN PO Take by mouth.     Multiple Vitamin (MULTIVITAMIN) tablet Take 1 tablet by mouth daily.     OVER THE COUNTER MEDICATION Reservatol     triamcinolone cream (KENALOG) 0.1 % Apply 1 application topically 2 (two) times daily. Use x 7 days then off x 7 days 60 g 0   Turmeric (QC TUMERIC COMPLEX PO) Take by mouth.     vitamin B-12 (CYANOCOBALAMIN) 100 MCG tablet Take 100 mcg by mouth daily.     Vitamin D, Cholecalciferol, 1000 UNITS TABS Take 4,000 Units by mouth daily.  otc     benzonatate (TESSALON PERLES) 100 MG capsule Take 1 capsule (100 mg total) by mouth 3 (three) times daily as needed. (Patient not taking: Reported on 04/04/2021) 20 capsule 0   Semaglutide,0.25 or 0.5MG/DOS, (OZEMPIC, 0.25 OR 0.5 MG/DOSE,) 2 MG/1.5ML SOPN Inject 0.5 mg into the skin once a week. (Patient not taking: Reported on 04/04/2021) 4.5 mL 3   No facility-administered medications prior to visit.    Allergies  Allergen Reactions   Humalog [Insulin Lispro] Other (See Comments)    Paresthesia - hands   Latex Hives    itching        Objective:    Physical Exam Vitals and nursing note reviewed.  Constitutional:      Appearance: Normal appearance.  Cardiovascular:     Rate and Rhythm: Normal rate and regular rhythm.  Pulmonary:     Effort: Pulmonary effort is normal.      Breath sounds: Normal breath sounds.  Musculoskeletal:        General: Normal range of motion.  Skin:    General: Skin is warm and dry.  Neurological:     Mental Status: She is alert.  Psychiatric:        Mood and Affect: Mood normal.        Behavior: Behavior normal.    BP 110/66   Pulse 88   Temp (!) 97.3 F (36.3 C) (Temporal)   Ht _0  (1.549 m)   Wt 171 lb (77.6 kg)   LMP  (LMP Unknown)   SpO2 100%   BMI 32.31 kg/m  Wt Readings from Last 3 Encounters:  04/04/21 171 lb (77.6 kg)  11/26/20 176 lb 9.6 oz (80.1 kg)  11/09/20 177 lb 3.2 oz (80.4 kg)       Assessment & Plan:   Problem List Items Addressed This Visit       Cardiovascular and Mediastinum   HTN (hypertension) - Primary    BP good today, weight also down 5lbs, started Ozempic about 2 mos ago. Continue Losartan and HCTZ. Will check labs next visit.        Other   Abnormal urine odor   Relevant Orders   POCT Urinalysis Dipstick (Completed)    I am having Kristen [name] maintain her vitamin C, multivitamin, Vitamin D (Cholecalciferol), vitamin B-12, magnesium, OneTouch Delica Plus [id number], OneTouch Ultra, ONE TOUCH ULTRA 2, Turmeric (QC TUMERIC COMPLEX PO), COLLAGEN PO, MELATONIN PO, Lantus SoloStar, Barberry-Oreg Grape-Goldenseal (BERBERINE COMPLEX PO), hydrochlorothiazide, losartan, triamcinolone cream, Insulin Pen Needle, Ozempic (0.25 or 0.5 MG/DOSE), OVER THE COUNTER MEDICATION, benzonatate, and empagliflozin.  No orders of the defined types were placed in this encounter.

## 2021-04-04 NOTE — Patient Instructions (Signed)
It was very nice to meet you today!  Go to the lab to drop off a urine sample. We will post the results on MyChart, so check this later today.    PLEASE NOTE:  If you had any lab tests please let us know if you have not heard back within a few days. You may see your results on mychart before we have a chance to review them but we will give you a call once they are reviewed by [place]. If we ordered any referrals today, please let us know if you have not heard from their office within the next week.   Please try these tips to maintain a healthy lifestyle:  Eat most of your calories during the day when you are active. Eliminate processed foods including packaged sweets (pies, cakes, cookies), reduce intake of potatoes, white bread, white pasta, and white rice. Look for whole grain options, oat flour or almond flour.  Each meal should contain half fruits/vegetables, one quarter protein, and one quarter carbs (no bigger than a computer mouse).  Cut down on sweet beverages. This includes juice, soda, and sweet tea. Also watch fruit intake, though this is a healthier sweet option, it still contains natural sugar! Limit to 3 servings daily.  Drink at least 1 glass of water with each meal and aim for at least 8 glasses per day  Exercise at least 150 minutes every week.

## 2021-04-07 ENCOUNTER — Other Ambulatory Visit: Payer: Self-pay | Admitting: Internal Medicine

## 2021-04-07 DIAGNOSIS — Z794 Long term (current) use of insulin: Secondary | ICD-10-CM

## 2021-04-07 DIAGNOSIS — E11319 Type 2 diabetes mellitus with unspecified diabetic retinopathy without macular edema: Secondary | ICD-10-CM

## 2021-05-17 ENCOUNTER — Other Ambulatory Visit: Payer: Self-pay | Admitting: Internal Medicine

## 2021-05-17 ENCOUNTER — Ambulatory Visit (INDEPENDENT_AMBULATORY_CARE_PROVIDER_SITE_OTHER): Payer: 59 | Admitting: Internal Medicine

## 2021-05-17 ENCOUNTER — Other Ambulatory Visit: Payer: Self-pay

## 2021-05-17 ENCOUNTER — Encounter: Payer: Self-pay | Admitting: Internal Medicine

## 2021-05-17 VITALS — BP 130/82 | HR 73 | Ht 61.0 in | Wt 171.6 lb

## 2021-05-17 DIAGNOSIS — E11319 Type 2 diabetes mellitus with unspecified diabetic retinopathy without macular edema: Secondary | ICD-10-CM | POA: Diagnosis not present

## 2021-05-17 DIAGNOSIS — Z794 Long term (current) use of insulin: Secondary | ICD-10-CM

## 2021-05-17 DIAGNOSIS — E669 Obesity, unspecified: Secondary | ICD-10-CM | POA: Diagnosis not present

## 2021-05-17 LAB — POCT GLYCOSYLATED HEMOGLOBIN (HGB A1C): Hemoglobin A1C: 7.9 % — AB (ref 4.0–5.6)

## 2021-05-17 MED ORDER — OZEMPIC (1 MG/DOSE) 4 MG/3ML ~~LOC~~ SOPN: 1.0000 mg | PEN_INJECTOR | SUBCUTANEOUS | 3 refills | Status: DC

## 2021-05-17 MED ORDER — LANTUS SOLOSTAR 100 UNIT/ML ~~LOC~~ SOPN: PEN_INJECTOR | SUBCUTANEOUS | 3 refills | Status: DC

## 2021-05-17 MED ORDER — NOVOLOG FLEXPEN 100 UNIT/ML ~~LOC~~ SOPN: 10.0000 [IU] | PEN_INJECTOR | Freq: Three times a day (TID) | SUBCUTANEOUS | 3 refills | Status: DC

## 2021-05-17 NOTE — Patient Instructions (Addendum)
Please continue: - Lantus 40 units in the morning and 20 units at night - Jardiance 25 mg before breakfast  Try to increase: - Ozempic 1 mg once a week  Please return in 4 months with your sugar log.

## 2021-05-17 NOTE — Progress Notes (Signed)
Patient ID: Kristen [name], female   DOB: [birth year], 71 y.o.   MRN: [id number]  This visit occurred during the SARS-CoV-2 public health emergency.  Safety protocols were in place, including screening questions prior to the visit, additional usage of staff PPE, and extensive cleaning of exam room while observing appropriate contact time as indicated for disinfecting solutions.   HPI: Kristen [name] is a 71 y.o.-year-old female, returning for f/u for DM2, dx in 2000, insulin-dependent since 2012, uncontrolled, with complications (DR).  Last visit 5 months ago.  Interim history: No increased urination, blurry vision, nausea, chest pain. At last visit, she was doing intermittent fasting, but not doing this as of now.  She was also more active, walking, but less now. She was not able to obtain NovoLog since last visit as her insurance did not cover it.  She was able to restart Jardiance and Ozempic, though.  Reviewed HbA1c levels: Lab Results  Component Value Date   HGBA1C 7.4 (H) 11/09/2020   HGBA1C 7.6 (A) 08/27/2020   HGBA1C 7.3 (A) 04/18/2020   HGBA1C 7.7 (H) 11/24/2019   HGBA1C 7.6 (H) 03/02/2019   HGBA1C 6.7 (A) 06/10/2018   HGBA1C 6.9 (A) 02/25/2018   HGBA1C 8.6 (A) 10/22/2017   HGBA1C 8.4 07/24/2017   HGBA1C 9.4 04/23/2017   HGBA1C 8.7 11/21/2016   HGBA1C 8.5 08/22/2016   HGBA1C 9.3 04/09/2016   HGBA1C 9.3 10/30/2015   HGBA1C 8.6 07/18/2015   HGBA1C 9.9 04/16/2015   HGBA1C 9.5 09/21/2014   HGBA1C 11.4 05/18/2014   HGBA1C 9.4 08/26/2013   HGBA1C 9.6 11/24/2012   She is on: -  Lantus 40 units in the morning >> 40 units 2x a day >> 40 units in am and 20 at night - Humalog 20 units 2x a day  before meals >> 20 to 25 units before L and D  - off - was not able to start Novolog - Jardiance 25 mg before breakfast >> restarted 11/2020 - Ozempic 0.25 >> 0.5 mg weekly -started 04/2020 >> restarted 11/2020 ($$) She stopped  Victoza b/c $$$. Trulicity not covered. She was on Jardiance 25 mg  daily before b'fast. - added 03/2016 - stopped 06/2016  >> sugars increased >> at last visit, I advised her to restart >> did not do so as this was expensive. She tried Metformin ER 500 mg >> still N/D/AP.  Pt checks her sugars 3-4 times a day: - am: 90-140 >> 140-160 >> 109-148 (off Humalog: 168) >> 105-128, 175, 180 (when misses insulin at night) - 2h after b'fast:  152  >> n/c >> 267 >> n/c - before lunch: 130-135 >> 130 (w/o Humalog) >> 180 >> n/c - 2h after lunch:  130-165, 180 >> 161-201 >> n/c - before dinner:  73, 110-173 >> n/c >> 125-132 >> n/c - 2h after dinner: 280-295 >> n/c >> 175-187 - bedtime: 175-200 >> 173, 180 >> 160s >> n/c - nighttime: 65 >> n/c >> 240-285 Lowest 40s (exercise) >> 75 >> 90 >> 140 >> 105; she has hypoglycemia awareness in the 70s. Highest sugar was 295 (party) >> 240 >> 201 >> 285 >> 187.  Glucometer: AccuChek  She does intermittent fasting. Meals: - brunch: grits + meat + 2 eggs - +/- snack tuna, boiled egg - dinner: meat + 3 veggies  -No CKD, last BUN/creatinine:  Lab Results  Component Value Date   BUN 18 11/09/2020   CREATININE 0.59 11/09/2020  On losartan.  -+ HL; last set of lipids:  Lab Results  Component Value Date   CHOL 136 11/09/2020   HDL 52 11/09/2020   LDLCALC 68 11/09/2020   TRIG 78 11/09/2020   CHOLHDL 2.6 11/09/2020  Not on a statin.  - last eye exam was in 09/2020: + DR - improved, + glaucoma, + small cataract. Dr. Phineas Real ([facility]  She also sees a retina specialist - [facility].  -She denies numbness and tingling in her feet.  She also has GERD, HTN.  ROS: + see HPI  I reviewed pt's medications, allergies, PMH, social hx, family hx, and changes were documented in the history of present illness. Otherwise, unchanged from my initial visit note.  Past Medical History:  Diagnosis Date   Colon polyps    Diabetes mellitus    Glaucoma    History of chicken pox    HTN (hypertension)    Meralgia  paresthetica    Past Surgical History:  Procedure Laterality Date   BREAST CYST EXCISION Left    patient states btw [phone]   CHOLECYSTECTOMY  06/16/2008   TONSILLECTOMY AND ADENOIDECTOMY     Social History   Social History   Marital Status: Widowed    Spouse Name: N/A   Number of Children: 1   Occupational History      Social History Main Topics   Smoking status: Never Smoker    Smokeless tobacco: Not on file   Alcohol Use: No   Drug Use: No   Current Outpatient Medications on File Prior to Visit  Medication Sig Dispense Refill   Ascorbic Acid (VITAMIN C) 1000 MG tablet Take 4,000 mg by mouth daily.     Barberry-Oreg Grape-Goldenseal (BERBERINE COMPLEX PO) Take by mouth.     benzonatate (TESSALON PERLES) 100 MG capsule Take 1 capsule (100 mg total) by mouth 3 (three) times daily as needed. (Patient not taking: Reported on 04/04/2021) 20 capsule 0   Blood Glucose Monitoring Suppl (ONE TOUCH ULTRA 2) w/Device KIT Use to check blood sugar 3 times daily 1 kit 0   COLLAGEN PO Take by mouth 3 (three) times a week.     glucose blood (ONETOUCH ULTRA) test strip USE AS INSTRUCTED TO CHECK  SUGAR 3 TIMES DAILY 300 strip 4   hydrochlorothiazide (HYDRODIURIL) 25 MG tablet Take 1 tablet (25 mg total) by mouth daily. 90 tablet 3   insulin glargine (LANTUS SOLOSTAR) 100 UNIT/ML Solostar Pen INJECT 40UNITS SUBCUTANEOUSLY DAILY 15 mL 1   Insulin Pen Needle 32G X 4 MM MISC Use 4x a day 400 each 3   JARDIANCE 25 MG TABS tablet TAKE 1 TABLET BY MOUTH DAILY BEFORE BREAKFAST. 90 tablet 0   Lancets (ONETOUCH DELICA PLUS [id number]) MISC TEST 3 TIMES DAILY 300 each 1   losartan (COZAAR) 100 MG tablet Take 1 tablet (100 mg total) by mouth daily. 90 tablet 3   magnesium 30 MG tablet Take 30 mg by mouth 2 (two) times daily.     MELATONIN PO Take by mouth.     Multiple Vitamin (MULTIVITAMIN) tablet Take 1 tablet by mouth daily.     OVER THE COUNTER MEDICATION Reservatol     Semaglutide,0.25 or  0.5MG/DOS, (OZEMPIC, 0.25 OR 0.5 MG/DOSE,) 2 MG/1.5ML SOPN Inject 0.5 mg into the skin once a week. (Patient not taking: Reported on 04/04/2021) 4.5 mL 3   triamcinolone cream (KENALOG) 0.1 % Apply 1 application topically 2 (two) times daily. Use x 7 days then off x 7 days 60 g 0   Turmeric (QC  TUMERIC COMPLEX PO) Take by mouth.     vitamin B-12 (CYANOCOBALAMIN) 100 MCG tablet Take 100 mcg by mouth daily.     Vitamin D, Cholecalciferol, 1000 UNITS TABS Take 4,000 Units by mouth daily. otc     No current facility-administered medications on file prior to visit.   Allergies  Allergen Reactions   Humalog [Insulin Lispro] Other (See Comments)    Paresthesia - hands   Latex Hives    itching   Family History  Problem Relation Age of Onset   Hypertension Mother    Diabetes Mother    Thyroid disease Mother    Diabetes Father    Cancer Father        Colon cancer   Diabetes Sister    PE: BP 130/82 (BP Location: Right Arm, Patient Position: Sitting, Cuff Size: Normal)   Pulse 73   Ht 5' 1"  (1.549 m)   Wt 171 lb 9.6 oz (77.8 kg)   LMP  (LMP Unknown)   SpO2 97%   BMI 32.42 kg/m   Wt Readings from Last 3 Encounters:  05/17/21 171 lb 9.6 oz (77.8 kg)  04/04/21 171 lb (77.6 kg)  11/26/20 176 lb 9.6 oz (80.1 kg)   Constitutional: overweight, in NAD Eyes: PERRLA, EOMI, no exophthalmos ENT: moist mucous membranes, no thyromegaly, no cervical lymphadenopathy Cardiovascular: RRR, No MRG Respiratory: CTA B Musculoskeletal: no deformities, strength intact in all 4 Skin: moist, warm, no rashes Neurological: no tremor with outstretched hands, DTR normal in all 4  ASSESSMENT: 1. DM2, insulin-dependent, uncontrolled, with complications - DR  No family history of medullary thyroid cancer or personal history of pancreatitis.  2. Obesity class 1 BMI Classification: < 18.5 underweight  18.5-24.9 normal weight  25.0-29.9 overweight  30.0-34.9 class I obesity  35.0-39.9 class II obesity   ? 40.0 class III obesity   PLAN:  1. Patient with longstanding, uncontrolled, type 2 diabetes, on basal-bolus insulin regimen and also now back on SGLT2 and GLP-1 receptor agonist after being off at last visit due to insurance coverage. -She was previously on Victoza but had to come off due to cost.  Trulicity was not covered.  Ozempic was approved for her but had to stop afterwards due to lack of coverage.  She was preparing to change her insurance at last visit and needed a preauthorization for [place].  We sent the PA for both.  We gave her a sample pen of Ozempic to be able to start this as soon as possible.  I also sent a prescription for NovoLog to her pharmacy as she had arm and hand paresthesias from Humalog in the past.  HbA1c before last visit was 7.4%, slightly lower. -At today's visit, she tells me she was able to start [place], however, she was not able to get NovoLog since this was not covered by her insurance.  At this visit, sugars are at or slightly above target, so for now, I do not absolutely feel that we need to start NovoLog.  We did discuss about possibly increasing Ozempic further and she agrees to do so.  She tolerates it well, weight decreased appetite.  We will continue the rest of the regimen for now - I suggested to:  Patient Instructions  Please continue: - Lantus 40 units in the morning and 20 units at night - Jardiance 25 mg before breakfast  Try to increase: - Ozempic 1 mg once a week  Please return in 4 months with your  sugar log.  - we checked her HbA1c: 7.9% (higher) - advised to check sugars at different times of the day - 3x a day, rotating check times - advised for yearly eye exams >> she is UTD - return to clinic in 4 months      2. Obesity class 1 -We will continue Jardiance and Ozempic, restarted at last visit, which should also help with weight loss  -She was doing intermittent fasting and walking, but less so now -Lost  4 pounds before last visit and lost 5 more pounds since then  Philemon Kingdom, MD PhD [facility]

## 2021-05-19 ENCOUNTER — Other Ambulatory Visit: Payer: Self-pay | Admitting: Internal Medicine

## 2021-05-20 ENCOUNTER — Other Ambulatory Visit: Payer: Self-pay | Admitting: Internal Medicine

## 2021-05-20 MED ORDER — TIRZEPATIDE 7.5 MG/0.5ML ~~LOC~~ SOAJ: 7.5000 mg | SUBCUTANEOUS | 11 refills | Status: DC

## 2021-05-22 ENCOUNTER — Telehealth: Payer: Self-pay | Admitting: Pharmacy Technician

## 2021-05-22 ENCOUNTER — Other Ambulatory Visit (HOSPITAL_COMMUNITY): Payer: Self-pay

## 2021-05-22 NOTE — Telephone Encounter (Signed)
-----   Message from Kenyon Ana, [place] sent at 05/22/2021  7:52 AM EST ----- Regarding: PA Can we start a PA for [facility]. Thank you.

## 2021-05-28 ENCOUNTER — Telehealth: Payer: Self-pay | Admitting: Internal Medicine

## 2021-05-28 ENCOUNTER — Other Ambulatory Visit (HOSPITAL_COMMUNITY): Payer: Self-pay

## 2021-05-28 ENCOUNTER — Other Ambulatory Visit: Payer: Self-pay | Admitting: Internal Medicine

## 2021-05-28 NOTE — Telephone Encounter (Signed)
PT called needing a refill on the below ASAP. insulin glargine (LANTUS SOLOSTAR) 100 UNIT/ML Solostar Pen  Pharmacy:  [id number] - [place], [place] - [address] Phone:  [phone]  Fax:  [phone]

## 2021-05-28 NOTE — Telephone Encounter (Signed)
Rx was previously sent and per pharmacy ready for pick up.

## 2021-05-28 NOTE — Telephone Encounter (Signed)
Patient Advocate Encounter   Received notification from CMA/Office that prior authorization for Mounjaro 7.5mg  is required by his/her insurance [employer].   PA submitted on 05/28/21 Key [id number] Status is pending    [facility] will continue to follow:  Patient Advocate Fax:  [phone]

## 2021-05-28 NOTE — Telephone Encounter (Signed)
Received notification from [employer] [facility]) regarding a prior authorization for [facility] 7.5MG . Authorization has been APPROVED from 12.13.22 to 12.13.23.   Per test claim with [facility], copay for 28 days supply is $50 with evoucher   Authorization # [id number]

## 2021-06-12 ENCOUNTER — Encounter: Payer: Self-pay | Admitting: Physician Assistant

## 2021-06-12 ENCOUNTER — Ambulatory Visit (INDEPENDENT_AMBULATORY_CARE_PROVIDER_SITE_OTHER): Payer: 59 | Admitting: Physician Assistant

## 2021-06-12 ENCOUNTER — Other Ambulatory Visit: Payer: Self-pay

## 2021-06-12 VITALS — BP 120/86 | HR 67 | Temp 98.0°F | Ht 61.0 in | Wt 168.2 lb

## 2021-06-12 DIAGNOSIS — R051 Acute cough: Secondary | ICD-10-CM

## 2021-06-12 MED ORDER — AMOXICILLIN-POT CLAVULANATE 875-125 MG PO TABS: 1.0000 | ORAL_TABLET | Freq: Two times a day (BID) | ORAL | 0 refills | Status: DC

## 2021-06-12 MED ORDER — ALBUTEROL SULFATE HFA 108 (90 BASE) MCG/ACT IN AERS: 2.0000 | INHALATION_SPRAY | Freq: Four times a day (QID) | RESPIRATORY_TRACT | 1 refills | Status: DC | PRN

## 2021-06-12 NOTE — Progress Notes (Signed)
Kristen [name] is a 70 y.o. female here for wheezing.Marland Kitchen  History of Present Illness:   Chief Complaint  Patient presents with   Wheezing    Pt c/o being sick on Christmas day, pt still has  non productive cough but got better; still wheezing and using OTC meds and Tessalon pearls. Ran low grade fever of 99 4 days ago; had negative Covid test.    HPI  URI Kristen [name] presents with c/o cough, congestion and wheezing that has been onset for 7 days. She had a cough that started about a week ago however cough has improved and now she has sensation of wheezing. Denies chest pain or SOB.  Pt does have a hx of asthma as a child and was previously prescribed an albuterol 108 mcg inhaler during URI several years ago. Does not have it on hand and has not tried it for this. Has a roaring sensation in her L ear.   Test for COVID was 3 days ago. Had COVID in Aug 2022.  Using theraflu and Emergen-C, honey. Drinking plenty of water. Does not like to take steroids, she is DM, last A1c 7.9.   Past Medical History:  Diagnosis Date   Colon polyps    Diabetes mellitus    Glaucoma    History of chicken pox    HTN (hypertension)    Meralgia paresthetica      Social History   Tobacco Use   Smoking status: Never   Smokeless tobacco: Never  Vaping Use   Vaping Use: Never used  Substance Use Topics   Alcohol use: No   Drug use: No    Past Surgical History:  Procedure Laterality Date   BREAST CYST EXCISION Left    patient states btw [phone]   CHOLECYSTECTOMY  06/16/2008   TONSILLECTOMY AND ADENOIDECTOMY      Family History  Problem Relation Age of Onset   Hypertension Mother    Diabetes Mother    Thyroid disease Mother    Diabetes Father    Cancer Father        Colon cancer   Diabetes Sister     Allergies  Allergen Reactions   Humalog [Insulin Lispro] Other (See Comments)    Paresthesia - hands   Latex Hives    itching    Current Medications:   Current Outpatient Medications:     Ascorbic Acid (VITAMIN C) 1000 MG tablet, Take 4,000 mg by mouth daily., Disp: , Rfl:    Barberry-Oreg Grape-Goldenseal (BERBERINE COMPLEX PO), Take by mouth., Disp: , Rfl:    benzonatate (TESSALON PERLES) 100 MG capsule, Take 1 capsule (100 mg total) by mouth 3 (three) times daily as needed., Disp: 20 capsule, Rfl: 0   Blood Glucose Monitoring Suppl (ONE TOUCH ULTRA 2) w/Device KIT, Use to check blood sugar 3 times daily, Disp: 1 kit, Rfl: 0   COLLAGEN PO, Take by mouth 3 (three) times a week., Disp: , Rfl:    glucose blood (ONETOUCH ULTRA) test strip, USE AS INSTRUCTED TO CHECK  SUGAR 3 TIMES DAILY, Disp: 300 strip, Rfl: 4   hydrochlorothiazide (HYDRODIURIL) 25 MG tablet, Take 1 tablet (25 mg total) by mouth daily., Disp: 90 tablet, Rfl: 3   insulin glargine (LANTUS SOLOSTAR) 100 UNIT/ML Solostar Pen, INJECT under skin 40 UNITS in AM and  20 UNITS in PM DAILY, Disp: 30 mL, Rfl: 3   Insulin Pen Needle 32G X 4 MM MISC, Use 4x a day, Disp: 400 each, Rfl: 3  JARDIANCE 25 MG TABS tablet, TAKE 1 TABLET BY MOUTH DAILY BEFORE BREAKFAST., Disp: 90 tablet, Rfl: 0   Lancets (ONETOUCH DELICA PLUS [id number]) MISC, TEST 3 TIMES DAILY, Disp: 300 each, Rfl: 1   losartan (COZAAR) 100 MG tablet, Take 1 tablet (100 mg total) by mouth daily., Disp: 90 tablet, Rfl: 3   magnesium 30 MG tablet, Take 30 mg by mouth 2 (two) times daily., Disp: , Rfl:    MELATONIN PO, Take by mouth., Disp: , Rfl:    Multiple Vitamin (MULTIVITAMIN) tablet, Take 1 tablet by mouth daily., Disp: , Rfl:    OVER THE COUNTER MEDICATION, Reservatol, Disp: , Rfl:    Semaglutide, 1 MG/DOSE, (OZEMPIC, 1 MG/DOSE,) 4 MG/3ML SOPN, Inject 1 mg into the skin once a week., Disp: 9 mL, Rfl: 3   tirzepatide (MOUNJARO) 7.5 MG/0.5ML Pen, Inject 7.5 mg into the skin once a week., Disp: 2 mL, Rfl: 11   triamcinolone cream (KENALOG) 0.1 %, Apply 1 application topically 2 (two) times daily. Use x 7 days then off x 7 days, Disp: 60 g, Rfl: 0   Turmeric (QC  TUMERIC COMPLEX PO), Take by mouth., Disp: , Rfl:    vitamin B-12 (CYANOCOBALAMIN) 100 MCG tablet, Take 100 mcg by mouth daily., Disp: , Rfl:    Vitamin D, Cholecalciferol, 1000 UNITS TABS, Take 4,000 Units by mouth daily. otc, Disp: , Rfl:    NOVOLOG FLEXPEN 100 UNIT/ML FlexPen, Inject 10-15 Units into the skin 3 (three) times daily with meals. ALLERGIC TO HUMALOG; DAW-1 (Patient not taking: Reported on 06/12/2021), Disp: 30 mL, Rfl: 3   Review of Systems:   ROS Negative unless otherwise specified per HPI. Vitals:   Vitals:   06/12/21 1000  BP: 120/86  Pulse: 67  Temp: 98 F (36.7 C)  TempSrc: Temporal  SpO2: 100%  Weight: 168 lb 3.2 oz (76.3 kg)  Height: 5' 1"  (1.549 m)     Body mass index is 31.78 kg/m.  Physical Exam:   Physical Exam Vitals and nursing note reviewed.  Constitutional:      General: She is not in acute distress.    Appearance: She is well-developed. She is not ill-appearing or toxic-appearing.  HENT:     Head: Normocephalic and atraumatic.     Right Ear: Tympanic membrane, ear canal and external ear normal. Tympanic membrane is not erythematous, retracted or bulging.     Left Ear: Tympanic membrane, ear canal and external ear normal. Tympanic membrane is not erythematous, retracted or bulging.     Nose: Nose normal.     Right Sinus: No maxillary sinus tenderness or frontal sinus tenderness.     Left Sinus: No maxillary sinus tenderness or frontal sinus tenderness.     Mouth/Throat:     Pharynx: Uvula midline. No posterior oropharyngeal erythema.  Eyes:     General: Lids are normal.     Conjunctiva/sclera: Conjunctivae normal.  Neck:     Trachea: Trachea normal.  Cardiovascular:     Rate and Rhythm: Normal rate and regular rhythm.     Heart sounds: Normal heart sounds, S1 normal and S2 normal.  Pulmonary:     Effort: Pulmonary effort is normal.     Breath sounds: Examination of the right-upper field reveals wheezing. Examination of the right-middle  field reveals wheezing. Examination of the right-lower field reveals wheezing. Wheezing (mild expiratory) present. No decreased breath sounds, rhonchi or rales.  Lymphadenopathy:     Cervical: No cervical adenopathy.  Skin:  General: Skin is warm and dry.  Neurological:     Mental Status: She is alert.  Psychiatric:        Speech: Speech normal.        Behavior: Behavior normal. Behavior is cooperative.    Assessment and Plan:   Acute cough No red flags on discussion, patient is not in any obvious distress during our visit. Discussed progression of most viral illness, and recommended supportive care at this point in time. Albuterol inhaler to use as needed was prescribed today. I did however provide pocket rx for oral augmentin should symptoms not improve as anticipated. Discussed over the counter supportive care options, with recommendations to push fluids and rest. Reviewed return precautions including new/worsening fever, SOB, new/worsening cough or other concerns.  Recommended need to self-quarantine and practice social distancing until symptoms resolve. Discussed current recommendations for COVID testing. I recommend that patient follow-up if symptoms worsen or persist despite treatment x 7-10 days, sooner if needed.  I,Havlyn C Ratchford,acting as a Education administrator for [employer], PA.,have documented all relevant documentation on the behalf of Inda Coke, PA,as directed by  Inda Coke, PA while in the presence of Inda Coke, [place].  I, Inda Coke, [place], have reviewed all documentation for this visit. The documentation on 06/12/21 for the exam, diagnosis, procedures, and orders are all accurate and complete.   Inda Coke, PA-C

## 2021-06-12 NOTE — Patient Instructions (Signed)
It was great to see you!  You have a viral upper respiratory infection. Antibiotics are not needed for this.  Viral infections usually take 7-10 days to resolve.  The cough can last a few weeks to go away.  Please trial: Robitussin DM Mucinex and Mucinex DM for cough.   Use medication as prescribed: albuterol inhaler as needed  I have sent in an antibiotic for you to use if you do not improve over the next few days or if you get worse.  Push fluids and get plenty of rest. Please return if you are not improving as expected, or if you have high fevers (>101.5) or difficulty swallowing or worsening productive cough.  Call clinic with questions.  I hope you start feeling better soon!

## 2021-06-13 ENCOUNTER — Telehealth: Payer: Self-pay

## 2021-06-13 NOTE — Telephone Encounter (Signed)
Patient states she was seen by Doreatha Martin and is requesting a note to be written out of work for 12/28 and 12/29.  States she is returning to work on 12/30.  Would like note to be uploaded to [employer].

## 2021-06-13 NOTE — Telephone Encounter (Signed)
Ok to do letter? Want me to also add she needs to be fever free for 24hr with our meds?

## 2021-06-19 ENCOUNTER — Other Ambulatory Visit: Payer: Self-pay | Admitting: Internal Medicine

## 2021-06-19 DIAGNOSIS — E11319 Type 2 diabetes mellitus with unspecified diabetic retinopathy without macular edema: Secondary | ICD-10-CM

## 2021-07-18 ENCOUNTER — Encounter: Payer: Self-pay | Admitting: Family

## 2021-07-18 ENCOUNTER — Ambulatory Visit (INDEPENDENT_AMBULATORY_CARE_PROVIDER_SITE_OTHER): Payer: 59 | Admitting: Family

## 2021-07-18 VITALS — BP 120/81 | HR 69 | Temp 97.3°F | Ht 61.0 in | Wt 167.4 lb

## 2021-07-18 DIAGNOSIS — I152 Hypertension secondary to endocrine disorders: Secondary | ICD-10-CM | POA: Diagnosis not present

## 2021-07-18 DIAGNOSIS — L570 Actinic keratosis: Secondary | ICD-10-CM

## 2021-07-18 DIAGNOSIS — B079 Viral wart, unspecified: Secondary | ICD-10-CM | POA: Diagnosis not present

## 2021-07-18 DIAGNOSIS — L989 Disorder of the skin and subcutaneous tissue, unspecified: Secondary | ICD-10-CM

## 2021-07-18 DIAGNOSIS — R21 Rash and other nonspecific skin eruption: Secondary | ICD-10-CM

## 2021-07-18 MED ORDER — TRIAMCINOLONE ACETONIDE 0.1 % EX CREA: 1.0000 "application " | TOPICAL_CREAM | Freq: Two times a day (BID) | CUTANEOUS | 0 refills | Status: DC

## 2021-07-18 NOTE — Assessment & Plan Note (Signed)
Chronic - Losartan, checking labs today.

## 2021-07-18 NOTE — Assessment & Plan Note (Signed)
just above forehead, right side on scalp.

## 2021-07-18 NOTE — Assessment & Plan Note (Signed)
denies new bra, detergents, lotions, soaps. denies tingling or burning, reports itching, triamcinolone helping some, sending to [facility]

## 2021-07-18 NOTE — Progress Notes (Signed)
Subjective:     Patient ID: Kristen [name], female    DOB: [birth year], 71 y.o.   MRN: [id number]  Chief Complaint  Patient presents with   Hypertension   Rash    Recurrent; Noticed 1 week ago. She has been on Triamcinolone, but is no longer working.  She complains of itching, but denies pain. Referral to Dermatology.    HPI: Hypertension: Patient is currently maintained on the following medications for blood pressure: Losartan Patient reports good compliance with blood pressure medications. Patient denies chest pain, headaches, shortness of breath or swelling. Last 3 blood pressure readings in our office are as follows: BP Readings from Last 3 Encounters:  07/18/21 120/81  06/12/21 120/86  05/17/21 130/82   Rash: Patient complains of rash involving the back. Rash started 7 days ago. Appearance of rash at onset: Other appearance: approx, 5-6 open lesions, 2cm in oval diameter. Rash has not changed over time. Discomfort associated with rash: is pruritic.  Denies: arthralgia and fever. Patient has not had previous evaluation of rash. Patient has had previous treatment for similar rash in past.  Response to treatment: little relief. Patient has not had contacts with similar rash. Patient has not identified precipitant. Patient has not had new exposures (soaps, lotions, laundry detergents, foods, medications, plants, insects or animals.) reports having shingles rash in past, but this does not present or feel the same to her.  Skin wart: Patient complains of skin wart. The wart is located on the  right forehead, scalp . Mertha Finders has been present 7 days. Interventions to date: none, but has used liquid tx in past for other warts on her body.  Patient does have a history of diabetes.       Health Maintenance Due  Topic Date Due   Pneumonia Vaccine 60+ Years old (1 - PCV) Never done   FOOT EXAM  01/29/2018   OPHTHALMOLOGY EXAM  05/16/2018    Past Medical History:  Diagnosis Date   Colon  polyps    Diabetes mellitus    Glaucoma    History of chicken pox    HTN (hypertension)    Meralgia paresthetica     Past Surgical History:  Procedure Laterality Date   BREAST CYST EXCISION Left    patient states btw 1973-1974   CHOLECYSTECTOMY  06/16/2008   TONSILLECTOMY AND ADENOIDECTOMY      Outpatient Medications Prior to Visit  Medication Sig Dispense Refill   albuterol (VENTOLIN HFA) 108 (90 Base) MCG/ACT inhaler Inhale 2 puffs into the lungs every 6 (six) hours as needed for wheezing or shortness of breath. 8 g 1   Ascorbic Acid (VITAMIN C) 1000 MG tablet Take 4,000 mg by mouth daily.     Barberry-Oreg Grape-Goldenseal (BERBERINE COMPLEX PO) Take by mouth.     Blood Glucose Monitoring Suppl (ONE TOUCH ULTRA 2) w/Device KIT Use to check blood sugar 3 times daily 1 kit 0   COLLAGEN PO Take by mouth 3 (three) times a week.     glucose blood (ONETOUCH ULTRA) test strip USE AS INSTRUCTED TO CHECK  SUGAR 3 TIMES DAILY 300 strip 4   hydrochlorothiazide (HYDRODIURIL) 25 MG tablet Take 1 tablet (25 mg total) by mouth daily. 90 tablet 3   insulin glargine (LANTUS SOLOSTAR) 100 UNIT/ML Solostar Pen INJECT under skin 40 UNITS in AM and  20 UNITS in PM DAILY 30 mL 3   Insulin Pen Needle 32G X 4 MM MISC Use 4x a day 400 each 3  JARDIANCE 25 MG TABS tablet TAKE 1 TABLET BY MOUTH EVERY DAY BEFORE BREAKFAST 90 tablet 0   Lancets (ONETOUCH DELICA PLUS [id number]) MISC TEST 3 TIMES DAILY 300 each 1   losartan (COZAAR) 100 MG tablet Take 1 tablet (100 mg total) by mouth daily. 90 tablet 3   magnesium 30 MG tablet Take 30 mg by mouth 2 (two) times daily.     MELATONIN PO Take by mouth.     Multiple Vitamin (MULTIVITAMIN) tablet Take 1 tablet by mouth daily.     OVER THE COUNTER MEDICATION Reservatol     Semaglutide, 1 MG/DOSE, (OZEMPIC, 1 MG/DOSE,) 4 MG/3ML SOPN Inject 1 mg into the skin once a week. 9 mL 3   tirzepatide (MOUNJARO) 7.5 MG/0.5ML Pen Inject 7.5 mg into the skin once a week. 2 mL  11   Turmeric (QC TUMERIC COMPLEX PO) Take by mouth.     vitamin B-12 (CYANOCOBALAMIN) 100 MCG tablet Take 100 mcg by mouth daily.     Vitamin D, Cholecalciferol, 1000 UNITS TABS Take 4,000 Units by mouth daily. otc     triamcinolone cream (KENALOG) 0.1 % Apply 1 application topically 2 (two) times daily. Use x 7 days then off x 7 days 60 g 0   amoxicillin-clavulanate (AUGMENTIN) 875-125 MG tablet Take 1 tablet by mouth 2 (two) times daily. (Patient not taking: Reported on 07/18/2021) 20 tablet 0   benzonatate (TESSALON PERLES) 100 MG capsule Take 1 capsule (100 mg total) by mouth 3 (three) times daily as needed. (Patient not taking: Reported on 07/18/2021) 20 capsule 0   NOVOLOG FLEXPEN 100 UNIT/ML FlexPen Inject 10-15 Units into the skin 3 (three) times daily with meals. ALLERGIC TO HUMALOG; DAW-1 (Patient not taking: Reported on 06/12/2021) 30 mL 3   No facility-administered medications prior to visit.    Allergies  Allergen Reactions   Humalog [Insulin Lispro] Other (See Comments)    Paresthesia - hands   Latex Hives    itching        Objective:    Physical Exam Vitals and nursing note reviewed.  Constitutional:      Appearance: Normal appearance.  Cardiovascular:     Rate and Rhythm: Normal rate and regular rhythm.  Pulmonary:     Effort: Pulmonary effort is normal.     Breath sounds: Normal breath sounds.  Musculoskeletal:        General: Normal range of motion.  Skin:    General: Skin is warm and dry.     Findings: Lesion (wart, forehead/scalp, approx 0.4cm diameter) and rash (5-6 lesions on middle back along bra line, starting to scab over) present.       Neurological:     Mental Status: She is alert.  Psychiatric:        Mood and Affect: Mood normal.        Behavior: Behavior normal.    BP 120/81    Pulse 69    Temp (!) 97.3 F (36.3 C) (Temporal)    Ht 5' 1"  (1.549 m)    Wt 167 lb 6.4 oz (75.9 kg)    LMP  (LMP Unknown)    SpO2 100%    BMI 31.63 kg/m  Wt  Readings from Last 3 Encounters:  07/18/21 167 lb 6.4 oz (75.9 kg)  06/12/21 168 lb 3.2 oz (76.3 kg)  05/17/21 171 lb 9.6 oz (77.8 kg)       Assessment & Plan:   Problem List Items Addressed This Visit  Cardiovascular and Mediastinum   HTN (hypertension) - Primary    Chronic - Losartan, checking labs today.      Relevant Orders   Lipid panel   Comp Met (CMET)     Musculoskeletal and Integument   Skin rash    denies new bra, detergents, lotions, soaps. denies tingling or burning, reports itching, triamcinolone helping some, sending to [facility]       Relevant Medications   triamcinolone cream (KENALOG) 0.1 %   Other Relevant Orders   Ambulatory referral to Dermatology   Benign skin lesion excluding plantar wart    just above forehead, right side on scalp.      Relevant Orders   Ambulatory referral to Dermatology    Meds ordered this encounter  Medications   triamcinolone cream (KENALOG) 0.1 %    Sig: Apply 1 application topically 2 (two) times daily. Use x 7 days then off x 7 days    Dispense:  60 g    Refill:  0    Order Specific Question:   Supervising Provider    Answer:   ANDY, CAMILLE L [[id number]]

## 2021-07-19 LAB — COMPREHENSIVE METABOLIC PANEL
ALT: 29 U/L (ref 0–35)
AST: 25 U/L (ref 0–37)
Albumin: 4.7 g/dL (ref 3.5–5.2)
Alkaline Phosphatase: 68 U/L (ref 39–117)
BUN: 19 mg/dL (ref 6–23)
CO2: 32 mEq/L (ref 19–32)
Calcium: 9.9 mg/dL (ref 8.4–10.5)
Chloride: 98 mEq/L (ref 96–112)
Creatinine, Ser: 0.81 mg/dL (ref 0.40–1.20)
GFR: 73.72 mL/min (ref >60.00)
Glucose, Bld: 158 mg/dL — ABNORMAL HIGH (ref 70–99)
Potassium: 4.8 mEq/L (ref 3.5–5.1)
Sodium: 139 mEq/L (ref 135–145)
Total Bilirubin: 0.5 mg/dL (ref 0.2–1.2)
Total Protein: 7.5 g/dL (ref 6.0–8.3)

## 2021-07-19 LAB — LIPID PANEL
Cholesterol: 151 mg/dL (ref 0–200)
HDL: 52 mg/dL (ref >39.00)
LDL Cholesterol: 82 mg/dL (ref 0–99)
NonHDL: 98.84
Total CHOL/HDL Ratio: 3
Triglycerides: 86 mg/dL (ref 0.0–149.0)
VLDL: 17.2 mg/dL (ref 0.0–40.0)

## 2021-07-22 ENCOUNTER — Telehealth: Payer: Self-pay

## 2021-07-22 NOTE — Telephone Encounter (Signed)
fine with me also

## 2021-07-22 NOTE — Telephone Encounter (Signed)
Patient is calling in stating she did not know she had transferred care to [place].    Patient states she prefers a PA or MD.   Is requesting to transfer from [place] to [place].  Please advise.

## 2021-07-24 NOTE — Telephone Encounter (Signed)
Left patient vm to call back to schedule appt  °

## 2021-07-29 ENCOUNTER — Ambulatory Visit (INDEPENDENT_AMBULATORY_CARE_PROVIDER_SITE_OTHER): Payer: 59 | Admitting: Physician Assistant

## 2021-07-29 ENCOUNTER — Other Ambulatory Visit: Payer: Self-pay

## 2021-07-29 ENCOUNTER — Encounter: Payer: Self-pay | Admitting: Physician Assistant

## 2021-07-29 VITALS — BP 130/72 | HR 76 | Temp 97.7°F | Ht 61.0 in | Wt 173.0 lb

## 2021-07-29 DIAGNOSIS — L409 Psoriasis, unspecified: Secondary | ICD-10-CM

## 2021-07-29 DIAGNOSIS — E785 Hyperlipidemia, unspecified: Secondary | ICD-10-CM

## 2021-07-29 DIAGNOSIS — E1169 Type 2 diabetes mellitus with other specified complication: Secondary | ICD-10-CM | POA: Diagnosis not present

## 2021-07-29 NOTE — Progress Notes (Signed)
Kristen [name] is a 71 y.o. female here for follow up of HTN.  History of Present Illness:   Chief Complaint  Patient presents with   Hypertension    Psoriasis  According to Kristen [name], she had been experiencing small red spots in her head and along her body for about three weeks which led her to follow up with Kristen Sewer, NP on 07/18/21. Following this visit she was prescribed triamcinolone cream 0.1%  and referred to dermatology. As she waited for her follow up appointment, she states she did use the cream and noticed that her small red spots crusted over and became white. Shortly after she was able to follow up with Kristen [name], with [name] and was informed to have psoriasis that could be cardiac-related.   After this visit, she was prescribed fluocinonide 0.1% cream and recommended to f/u with PCP for routine cardiac workup. Currently pt is compliant with using prescribed cream and is managing well.    Hyperlipidemia associated with DM Pt has never been on medication for this issue due to preference of improving her diet and regular exercise. Although she has experienced 2 isolated events of episodes of left-sided chest pain which have now resolved, she always believed it to be caused by past arm issues and never showed any concern. [name] reports she has always made it a point to participate in regular exercise and a healthy/well balanced diet.   Despite not wanting to start any high cholesterol medications at all, she is willing to trial one if something is found following completion of tests. Pt does have a fhx of MI and hx of HTN. Denies concerning sx.    Past Medical History:  Diagnosis Date   Colon polyps    Diabetes mellitus    Glaucoma    History of chicken pox    HTN (hypertension)    Meralgia paresthetica      Social History   Tobacco Use   Smoking status: Never   Smokeless tobacco: Never  Vaping Use   Vaping Use: Never used  Substance Use Topics   Alcohol  use: No   Drug use: No    Past Surgical History:  Procedure Laterality Date   BREAST CYST EXCISION Left    patient states btw [phone]   CHOLECYSTECTOMY  06/16/2008   TONSILLECTOMY AND ADENOIDECTOMY      Family History  Problem Relation Age of Onset   Hypertension Mother    Diabetes Mother    Thyroid disease Mother    Diabetes Father    Cancer Father        Colon cancer   Diabetes Sister     Allergies  Allergen Reactions   Humalog [Insulin Lispro] Other (See Comments)    Paresthesia - hands   Latex Hives    itching    Current Medications:   Current Outpatient Medications:    Ascorbic Acid (VITAMIN C) 1000 MG tablet, Take 4,000 mg by mouth daily., Disp: , Rfl:    Barberry-Oreg Grape-Goldenseal (BERBERINE COMPLEX PO), Take by mouth., Disp: , Rfl:    Blood Glucose Monitoring Suppl (ONE TOUCH ULTRA 2) w/Device KIT, Use to check blood sugar 3 times daily, Disp: 1 kit, Rfl: 0   COLLAGEN PO, Take by mouth 3 (three) times a week., Disp: , Rfl:    Fluocinonide 0.1 % CREA, Apply topically., Disp: , Rfl:    glucose blood (ONETOUCH ULTRA) test strip, USE AS INSTRUCTED TO CHECK  SUGAR 3 TIMES DAILY, Disp: 300 strip,  Rfl: 4   hydrochlorothiazide (HYDRODIURIL) 25 MG tablet, Take 1 tablet (25 mg total) by mouth daily., Disp: 90 tablet, Rfl: 3   insulin glargine (LANTUS SOLOSTAR) 100 UNIT/ML Solostar Pen, INJECT under skin 40 UNITS in AM and  20 UNITS in PM DAILY, Disp: 30 mL, Rfl: 3   Insulin Pen Needle 32G X 4 MM MISC, Use 4x a day, Disp: 400 each, Rfl: 3   JARDIANCE 25 MG TABS tablet, TAKE 1 TABLET BY MOUTH EVERY DAY BEFORE BREAKFAST, Disp: 90 tablet, Rfl: 0   Lancets (ONETOUCH DELICA PLUS [id number]) MISC, TEST 3 TIMES DAILY, Disp: 300 each, Rfl: 1   losartan (COZAAR) 100 MG tablet, Take 1 tablet (100 mg total) by mouth daily., Disp: 90 tablet, Rfl: 3   magnesium 30 MG tablet, Take 30 mg by mouth 2 (two) times daily., Disp: , Rfl:    MELATONIN PO, Take by mouth., Disp: , Rfl:     Multiple Vitamin (MULTIVITAMIN) tablet, Take 1 tablet by mouth daily., Disp: , Rfl:    OVER THE COUNTER MEDICATION, Reservatol, Disp: , Rfl:    Semaglutide, 1 MG/DOSE, (OZEMPIC, 1 MG/DOSE,) 4 MG/3ML SOPN, Inject 1 mg into the skin once a week., Disp: 9 mL, Rfl: 3   Turmeric (QC TUMERIC COMPLEX PO), Take by mouth., Disp: , Rfl:    vitamin B-12 (CYANOCOBALAMIN) 100 MCG tablet, Take 100 mcg by mouth daily., Disp: , Rfl:    Vitamin D, Cholecalciferol, 1000 UNITS TABS, Take 4,000 Units by mouth daily. otc, Disp: , Rfl:    Review of Systems:   ROS Negative unless otherwise specified per HPI. Vitals:   Vitals:   07/29/21 1308  BP: 130/72  Pulse: 76  Temp: 97.7 F (36.5 C)  TempSrc: Temporal  SpO2: 97%  Weight: 173 lb (78.5 kg)  Height: _0  (1.549 m)     Body mass index is 32.69 kg/m.  Physical Exam:   Physical Exam Vitals and nursing note reviewed.  Constitutional:      General: She is not in acute distress.    Appearance: She is well-developed. She is not ill-appearing or toxic-appearing.  Cardiovascular:     Rate and Rhythm: Normal rate and regular rhythm.     Pulses: Normal pulses.     Heart sounds: Normal heart sounds, S1 normal and S2 normal.  Pulmonary:     Effort: Pulmonary effort is normal.     Breath sounds: Normal breath sounds.  Skin:    General: Skin is warm and dry.  Neurological:     Mental Status: She is alert.     GCS: GCS eye subscore is 4. GCS verbal subscore is 5. GCS motor subscore is 6.  Psychiatric:        Speech: Speech normal.        Behavior: Behavior normal. Behavior is cooperative.    Assessment and Plan:   Hyperlipidemia associated with type 2 diabetes mellitus ([place]) Patient declines medication intervention at this time Ordered echocardiogram and CT Calcium score for further evaluation  Follow up based on results  -- she is considering medication if CT score warrants this  Psoriasis Patient reports good control of her psoriasis  presently     Informed patient that once dermatology records have been obtained, will reach out to her to make recommendations accordingly if indicated Ordered echocardiogram and CT Calcium score for further evaluation as heart disease can occur more frequently in patients with psoriasis diagnosis  I,Havlyn C Ratchford,acting as a scribe for  Inda Coke, PA.,have documented all relevant documentation on the behalf of Inda Coke, PA,as directed by  Inda Coke, PA while in the presence of Inda Coke, [place].  I, Inda Coke, [place], have reviewed all documentation for this visit. The documentation on 07/29/21 for the exam, diagnosis, procedures, and orders are all accurate and complete.  Time spent with patient today was 25 minutes which consisted of chart review, discussing diagnosis, work up, treatment answering questions and documentation.  Inda Coke, PA-C

## 2021-07-29 NOTE — Patient Instructions (Signed)
It was great to see you!  We are going to order a calcium score for evaluation of the vessels around your heart -- you will get a phone call about this  We are going to order an ultrasound for evaluation of your heart -- you will get a phone call about this  Once we get your dermatology records, if we have further recommendations, we will let you know  If a referral was placed today, you will be contacted for an appointment. Please note that routine referrals can sometimes take up to 3-4 weeks to process. Please call our office if you haven't heard anything after this time frame.  Take care,  Jarold Motto PA-C

## 2021-08-05 ENCOUNTER — Ambulatory Visit: Payer: 59 | Admitting: Family

## 2021-08-12 ENCOUNTER — Other Ambulatory Visit: Payer: Self-pay

## 2021-08-12 ENCOUNTER — Ambulatory Visit (INDEPENDENT_AMBULATORY_CARE_PROVIDER_SITE_OTHER): Payer: 59 | Admitting: Physician Assistant

## 2021-08-12 ENCOUNTER — Encounter: Payer: Self-pay | Admitting: Physician Assistant

## 2021-08-12 VITALS — BP 118/70 | HR 74 | Temp 97.8°F | Ht 61.0 in | Wt 172.5 lb

## 2021-08-12 DIAGNOSIS — R21 Rash and other nonspecific skin eruption: Secondary | ICD-10-CM

## 2021-08-12 DIAGNOSIS — N649 Disorder of breast, unspecified: Secondary | ICD-10-CM

## 2021-08-12 LAB — CBC WITH DIFFERENTIAL/PLATELET
Basophils Absolute: 0 10*3/uL (ref 0.0–0.1)
Basophils Relative: 0.6 % (ref 0.0–3.0)
Eosinophils Absolute: 0.1 10*3/uL (ref 0.0–0.7)
Eosinophils Relative: 2.6 % (ref 0.0–5.0)
HCT: 41.6 % (ref 36.0–46.0)
Hemoglobin: 13.6 g/dL (ref 12.0–15.0)
Lymphocytes Relative: 28 % (ref 12.0–46.0)
Lymphs Abs: 1.5 10*3/uL (ref 0.7–4.0)
MCHC: 32.6 g/dL (ref 30.0–36.0)
MCV: 92.3 fl (ref 78.0–100.0)
Monocytes Absolute: 0.5 10*3/uL (ref 0.1–1.0)
Monocytes Relative: 8.3 % (ref 3.0–12.0)
Neutro Abs: 3.3 10*3/uL (ref 1.4–7.7)
Neutrophils Relative %: 60.5 % (ref 43.0–77.0)
Platelets: 162 10*3/uL (ref 150.0–400.0)
RBC: 4.5 Mil/uL (ref 3.87–5.11)
RDW: 13.9 % (ref 11.5–15.5)
WBC: 5.4 10*3/uL (ref 4.0–10.5)

## 2021-08-12 LAB — COMPREHENSIVE METABOLIC PANEL
ALT: 33 U/L (ref 0–35)
AST: 23 U/L (ref 0–37)
Albumin: 4.3 g/dL (ref 3.5–5.2)
Alkaline Phosphatase: 67 U/L (ref 39–117)
BUN: 16 mg/dL (ref 6–23)
CO2: 30 mEq/L (ref 19–32)
Calcium: 9.5 mg/dL (ref 8.4–10.5)
Chloride: 97 mEq/L (ref 96–112)
Creatinine, Ser: 0.97 mg/dL (ref 0.40–1.20)
GFR: 59.35 mL/min — ABNORMAL LOW (ref >60.00)
Glucose, Bld: 156 mg/dL — ABNORMAL HIGH (ref 70–99)
Potassium: 3.8 mEq/L (ref 3.5–5.1)
Sodium: 137 mEq/L (ref 135–145)
Total Bilirubin: 0.3 mg/dL (ref 0.2–1.2)
Total Protein: 6.7 g/dL (ref 6.0–8.3)

## 2021-08-12 LAB — C-REACTIVE PROTEIN: CRP: <1 mg/dL (ref 0.5–20.0)

## 2021-08-12 LAB — SEDIMENTATION RATE: Sed Rate: 13 mm/hr (ref 0–30)

## 2021-08-12 MED ORDER — PREDNISONE 20 MG PO TABS: 40.0000 mg | ORAL_TABLET | Freq: Every day | ORAL | 0 refills | Status: DC

## 2021-08-12 NOTE — Patient Instructions (Addendum)
It was great to see you!  Please avoid further use of antibacterial soap  We will update blood work today  I'm putting in an urgent referral for a different dermatologist  Start prednisone to calm this rash down -- adjust insulin as needed  We are also going to update your mammogram to make sure this is not anything concerning in your right breast  Take care,  Jarold Motto PA-C

## 2021-08-12 NOTE — Progress Notes (Signed)
Kristen [name] is a 71 y.o. female here for a rash.  History of Present Illness:   Chief Complaint  Patient presents with   Rash    Pt c/o red spots on arms and shoulder, Also has red area on right breast.    HPI  Rash [name] presents with c/o red spots on her body, mainly on her arms, legs, back and bilateral shoulders. Pt expressed belief that she was having a flare-up of her newly dx psoriasis and has been looking for relief. States she has done research on what she could trial for relief until she is able to follow up with dermatology and found manuka honey ointment. Due to being advised on informing your healthcare provider prior to trialing the ointment, she wanted to get advice on if she should trial this or something else for additional relief.   She was given fluocinonide 0.1% cream from her dermatologist but feels as though this has made things significantly worse.   Right Breast Lesion Pt states she had what appeared to be a blackhead on her right breast and had squeezed it out as she normally does. Although she didn't have anything unusual occur immediately after doing this, she did notice a big red spot appear around the area on the following day. According to pt, this lesion has been present for about a month and a half and has been slowly improving since she has been applying fluocinonide ointment on it daily. Denies nipple discharge, lump in breast, or breast tenderness.     Past Medical History:  Diagnosis Date   Colon polyps    Diabetes mellitus    Glaucoma    History of chicken pox    HTN (hypertension)    Meralgia paresthetica      Social History   Tobacco Use   Smoking status: Never   Smokeless tobacco: Never  Vaping Use   Vaping Use: Never used  Substance Use Topics   Alcohol use: No   Drug use: No    Past Surgical History:  Procedure Laterality Date   BREAST CYST EXCISION Left    patient states btw [phone]   CHOLECYSTECTOMY  06/16/2008    TONSILLECTOMY AND ADENOIDECTOMY      Family History  Problem Relation Age of Onset   Hypertension Mother    Diabetes Mother    Thyroid disease Mother    Diabetes Father    Cancer Father        Colon cancer   Hypertension Father    Diabetes Sister     Allergies  Allergen Reactions   Humalog [Insulin Lispro] Other (See Comments)    Paresthesia - hands   Latex Hives    itching    Current Medications:   Current Outpatient Medications:    Ascorbic Acid (VITAMIN C) 1000 MG tablet, Take 4,000 mg by mouth daily., Disp: , Rfl:    Blood Glucose Monitoring Suppl (ONE TOUCH ULTRA 2) w/Device KIT, Use to check blood sugar 3 times daily, Disp: 1 kit, Rfl: 0   COLLAGEN PO, Take by mouth 3 (three) times a week., Disp: , Rfl:    glucose blood (ONETOUCH ULTRA) test strip, USE AS INSTRUCTED TO CHECK  SUGAR 3 TIMES DAILY, Disp: 300 strip, Rfl: 4   hydrochlorothiazide (HYDRODIURIL) 25 MG tablet, Take 1 tablet (25 mg total) by mouth daily., Disp: 90 tablet, Rfl: 3   insulin glargine (LANTUS SOLOSTAR) 100 UNIT/ML Solostar Pen, INJECT under skin 40 UNITS in AM and  20 UNITS  in PM DAILY, Disp: 30 mL, Rfl: 3   Insulin Pen Needle 32G X 4 MM MISC, Use 4x a day, Disp: 400 each, Rfl: 3   JARDIANCE 25 MG TABS tablet, TAKE 1 TABLET BY MOUTH EVERY DAY BEFORE BREAKFAST, Disp: 90 tablet, Rfl: 0   Lancets (ONETOUCH DELICA PLUS [id number]) MISC, TEST 3 TIMES DAILY, Disp: 300 each, Rfl: 1   losartan (COZAAR) 100 MG tablet, Take 1 tablet (100 mg total) by mouth daily., Disp: 90 tablet, Rfl: 3   magnesium 30 MG tablet, Take 30 mg by mouth 2 (two) times daily., Disp: , Rfl:    MELATONIN PO, Take by mouth., Disp: , Rfl:    Multiple Vitamin (MULTIVITAMIN) tablet, Take 1 tablet by mouth daily., Disp: , Rfl:    OVER THE COUNTER MEDICATION, Reservatol, Disp: , Rfl:    Semaglutide, 1 MG/DOSE, (OZEMPIC, 1 MG/DOSE,) 4 MG/3ML SOPN, Inject 1 mg into the skin once a week., Disp: 9 mL, Rfl: 3   Turmeric (QC TUMERIC COMPLEX PO),  Take by mouth., Disp: , Rfl:    Vitamin D, Cholecalciferol, 1000 UNITS TABS, Take 4,000 Units by mouth daily. otc, Disp: , Rfl:    Barberry-Oreg Grape-Goldenseal (BERBERINE COMPLEX PO), Take by mouth. (Patient not taking: Reported on 08/12/2021), Disp: , Rfl:    Fluocinonide 0.1 % CREA, Apply topically. (Patient not taking: Reported on 08/12/2021), Disp: , Rfl:    vitamin B-12 (CYANOCOBALAMIN) 100 MCG tablet, Take 100 mcg by mouth daily. (Patient not taking: Reported on 08/12/2021), Disp: , Rfl:    Review of Systems:   ROS Negative unless otherwise specified per HPI. Vitals:   Vitals:   08/12/21 1037  BP: 118/70  Pulse: 74  Temp: 97.8 F (36.6 C)  TempSrc: Temporal  SpO2: 99%  Weight: 172 lb 8 oz (78.2 kg)  Height: 5' 1"  (1.549 m)     Body mass index is 32.59 kg/m.  Physical Exam:   Physical Exam Vitals and nursing note reviewed.  Constitutional:      General: She is not in acute distress.    Appearance: She is well-developed. She is not ill-appearing or toxic-appearing.  Cardiovascular:     Rate and Rhythm: Normal rate and regular rhythm.     Pulses: Normal pulses.     Heart sounds: Normal heart sounds, S1 normal and S2 normal.  Pulmonary:     Effort: Pulmonary effort is normal.     Breath sounds: Normal breath sounds.  Chest:  Breasts:    Right: No swelling, inverted nipple or nipple discharge.     Left: Normal.     Comments: 3cm by 3cm erythematous lesion on right breast No discharge or tenderness Skin:    General: Skin is warm and dry.     Comments: Multiple erythematous plaques scattered on back and bilateral arms  Neurological:     Mental Status: She is alert.     GCS: GCS eye subscore is 4. GCS verbal subscore is 5. GCS motor subscore is 6.  Psychiatric:        Speech: Speech normal.        Behavior: Behavior normal. Behavior is cooperative.    Assessment and Plan:   Rash and nonspecific skin eruption Worsening despite intervention Update labs today  for autoimmune work-up given worsening despite dermatological intervention Placed urgent referral to dermatology Start prednisone 20 mg BID -- may adjust insulin as needed - she is aware of how to do this Recommended patient avoid further use of antibacterial  soap to prevent further irritation Follow up based on results   R Breast Lesion I have ordered STAT mammogram for further evaluation    I,Havlyn C Ratchford,acting as a scribe for Inda Coke, PA.,have documented all relevant documentation on the behalf of Inda Coke, PA,as directed by  Inda Coke, PA while in the presence of Inda Coke, [place].   I, Inda Coke, [place], have reviewed all documentation for this visit. The documentation on 08/12/21 for the exam, diagnosis, procedures, and orders are all accurate and complete.   Inda Coke, PA-C

## 2021-08-13 ENCOUNTER — Other Ambulatory Visit: Payer: Self-pay | Admitting: *Deleted

## 2021-08-13 DIAGNOSIS — R768 Other specified abnormal immunological findings in serum: Secondary | ICD-10-CM

## 2021-08-14 ENCOUNTER — Other Ambulatory Visit: Payer: Self-pay | Admitting: Physician Assistant

## 2021-08-14 DIAGNOSIS — R21 Rash and other nonspecific skin eruption: Secondary | ICD-10-CM

## 2021-08-15 LAB — ANTI-NUCLEAR AB-TITER (ANA TITER): ANA Titer 1: 1:160 {titer} — ABNORMAL HIGH

## 2021-08-15 LAB — RHEUMATOID FACTOR: Rheumatoid fact SerPl-aCnc: 19 IU/mL — ABNORMAL HIGH (ref <14)

## 2021-08-15 LAB — ANA: Anti Nuclear Antibody (ANA): POSITIVE — AB

## 2021-08-18 ENCOUNTER — Other Ambulatory Visit: Payer: Self-pay | Admitting: Internal Medicine

## 2021-08-18 ENCOUNTER — Other Ambulatory Visit: Payer: Self-pay | Admitting: Family Medicine

## 2021-08-18 DIAGNOSIS — E11319 Type 2 diabetes mellitus with unspecified diabetic retinopathy without macular edema: Secondary | ICD-10-CM

## 2021-08-19 ENCOUNTER — Other Ambulatory Visit: Payer: Self-pay

## 2021-08-27 ENCOUNTER — Other Ambulatory Visit: Payer: Self-pay | Admitting: Physician Assistant

## 2021-08-27 DIAGNOSIS — N649 Disorder of breast, unspecified: Secondary | ICD-10-CM

## 2021-08-27 DIAGNOSIS — R21 Rash and other nonspecific skin eruption: Secondary | ICD-10-CM

## 2021-08-29 ENCOUNTER — Other Ambulatory Visit: Payer: Self-pay

## 2021-08-29 ENCOUNTER — Ambulatory Visit
Admission: RE | Admit: 2021-08-29 | Discharge: 2021-08-29 | Disposition: A | Discharge status: Home / Self Care | Payer: 59 | Source: Ambulatory Visit | Attending: Physician Assistant | Admitting: Physician Assistant

## 2021-08-29 ENCOUNTER — Ambulatory Visit (HOSPITAL_COMMUNITY): Payer: 59 | Attending: Cardiology

## 2021-08-29 ENCOUNTER — Ambulatory Visit: Payer: 59

## 2021-08-29 DIAGNOSIS — I340 Nonrheumatic mitral (valve) insufficiency: Secondary | ICD-10-CM | POA: Diagnosis not present

## 2021-08-29 DIAGNOSIS — I361 Nonrheumatic tricuspid (valve) insufficiency: Secondary | ICD-10-CM

## 2021-08-29 DIAGNOSIS — E1169 Type 2 diabetes mellitus with other specified complication: Secondary | ICD-10-CM | POA: Insufficient documentation

## 2021-08-29 DIAGNOSIS — E785 Hyperlipidemia, unspecified: Secondary | ICD-10-CM | POA: Diagnosis present

## 2021-08-29 LAB — ECHOCARDIOGRAM COMPLETE
Area-P 1/2: 4.31 cm2
S' Lateral: 3 cm

## 2021-08-30 ENCOUNTER — Other Ambulatory Visit: Payer: Self-pay

## 2021-08-30 DIAGNOSIS — R931 Abnormal findings on diagnostic imaging of heart and coronary circulation: Secondary | ICD-10-CM

## 2021-09-02 ENCOUNTER — Other Ambulatory Visit: Payer: Self-pay

## 2021-09-02 ENCOUNTER — Encounter: Payer: Self-pay | Admitting: Internal Medicine

## 2021-09-02 ENCOUNTER — Other Ambulatory Visit: Payer: Self-pay | Admitting: Internal Medicine

## 2021-09-02 ENCOUNTER — Ambulatory Visit: Payer: 59 | Admitting: Internal Medicine

## 2021-09-02 VITALS — BP 136/82 | HR 75 | Ht 62.0 in | Wt 172.8 lb

## 2021-09-02 DIAGNOSIS — E1159 Type 2 diabetes mellitus with other circulatory complications: Secondary | ICD-10-CM | POA: Diagnosis not present

## 2021-09-02 DIAGNOSIS — I429 Cardiomyopathy, unspecified: Secondary | ICD-10-CM

## 2021-09-02 DIAGNOSIS — E119 Type 2 diabetes mellitus without complications: Secondary | ICD-10-CM

## 2021-09-02 DIAGNOSIS — Z79899 Other long term (current) drug therapy: Secondary | ICD-10-CM

## 2021-09-02 DIAGNOSIS — E1169 Type 2 diabetes mellitus with other specified complication: Secondary | ICD-10-CM | POA: Diagnosis not present

## 2021-09-02 DIAGNOSIS — E785 Hyperlipidemia, unspecified: Secondary | ICD-10-CM

## 2021-09-02 DIAGNOSIS — Z01812 Encounter for preprocedural laboratory examination: Secondary | ICD-10-CM

## 2021-09-02 DIAGNOSIS — Z794 Long term (current) use of insulin: Secondary | ICD-10-CM

## 2021-09-02 DIAGNOSIS — I152 Hypertension secondary to endocrine disorders: Secondary | ICD-10-CM

## 2021-09-02 MED ORDER — ATORVASTATIN CALCIUM 40 MG PO TABS: 40.0000 mg | ORAL_TABLET | Freq: Every day | ORAL | 3 refills | Status: DC

## 2021-09-02 MED ORDER — METOPROLOL SUCCINATE ER 25 MG PO TB24: 25.0000 mg | ORAL_TABLET | Freq: Every day | ORAL | 3 refills | Status: DC

## 2021-09-02 NOTE — Progress Notes (Signed)
?Cardiology Office Note:   ? ?Date:  09/02/2021  ? ?ID:  Kristen [name], DOB [birth year], MRN [id number] ? ?PCP:  Inda Coke, PA  ? ?[facility] Providers ?Cardiologist:  Lenna Sciara, MD ?Referring MD: Inda Coke, PA  ? ?Chief Complaint/Reason for Referral: Abnormal echocardiogram ? ?ASSESSMENT:   ? ?1. Cardiomyopathy, unspecified type ([place])   ?2. Type 2 diabetes mellitus without complication, with long-term current use of insulin ([place])   ?3. Hypertension associated with diabetes ([place])   ?4. Hyperlipidemia associated with type 2 diabetes mellitus ([place])   ? ? ?PLAN:   ? ?In order of problems listed above: ?1.  We will refer for cardiac catheterization to evaluate and assess further.  Continue losartan, Jardiance, and will start Toprol XL 18m QPM.  Follow up 3 months. ?2.  Continue losartan, Jardiance, and start aspirin 81 mg and atorvastatin 40 mg given her history of diabetes. ?3.  Continue losartan and hydrochlorothiazide with goal blood pressure of less than 130/80 mmHg ?4.  Start atorvastatin as detailed above with goal LDL of less than 70.  Check lipid panel and LFTs in 2 months. ? ? ?     ? ?Shared Decision Making/Informed Consent ?The risks [stroke (1 in 1000), death (1 in 112, kidney failure [usually temporary] (1 in 500), bleeding (1 in 200), allergic reaction [possibly serious] (1 in 200)], benefits (diagnostic support and management of coronary artery disease) and alternatives of a cardiac catheterization were discussed in detail with [name] and she is willing to proceed.  ? ?Dispo:  Return in about 3 months (around 12/03/2021).  ? ?  ? ?Medication Adjustments/Labs and Tests Ordered: ?Current medicines are reviewed at length with the patient today.  Concerns regarding medicines are outlined above.  ? ?Tests Ordered: ?Orders Placed This Encounter  ?Procedures  ? EKG 12-Lead  ? ? ?Medication Changes: ?No orders of the defined types were placed in this encounter. ? ? ?History of Present  Illness:   ? ?FOCUSED PROBLEM LIST:   ?1.  Cardiomyopathy with ejection fraction of 45 to 50% with multiple wall motion abnormalities on echocardiogram March 2023 ?2.  Type 2 diabetes on insulin ?3.  Hypertension ?4.  Hyperlipidemia ? ?The patient is a 71y.o. female with the indicated medical history here for recommendations regarding abnormal echocardiogram.  Patient was seen by her primary care provider recently.  An echocardiogram was ordered which demonstrated abnormalities as detailed above.  Echocardiogram was ordered because there was consideration to start potential immunomodulatory therapy for psoriasis.  Time there has been a reconsideration regarding the diagnosis of psoriasis.  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  She does not exercise.  She denies any exertional angina, exertional dyspnea, presyncope, syncope, palpitations, paroxysmal nocturnal dyspnea, orthopnea.  She did have lower extremity swelling that seemed to improve after the introduction of hydrochlorothiazide.  She has required no emergency room visits or hospitalizations.  She is otherwise well and without significant complaints. ? ?She tells me she abstains from tobacco and alcohol.  She is trying to increase her physical activity with more regular exercise over the last couple weeks as well. ? ?   ?Current Medications: ?Current Meds  ?Medication Sig  ? Ascorbic Acid (VITAMIN C) 1000 MG tablet Take 4,000 mg by mouth daily.  ? aspirin EC 81 MG tablet Take 81 mg by mouth daily. Swallow whole.  ? Blood Glucose Monitoring Suppl (  ONE TOUCH ULTRA 2) w/Device KIT Use to check blood sugar 3 times daily  ? clarithromycin (BIAXIN) 500 MG tablet Take 500 mg by mouth 2 (two) times daily.  ? COLLAGEN PO Take by mouth 3 (three) times a week.  ? glucose blood (ONETOUCH ULTRA) test strip USE AS INSTRUCTED TO CHECK  SUGAR 3 TIMES DAILY  ? hydrochlorothiazide  (HYDRODIURIL) 25 MG tablet TAKE 1 TABLET BY MOUTH DAILY  ? insulin glargine (LANTUS SOLOSTAR) 100 UNIT/ML Solostar Pen INJECT under skin 40 UNITS in AM and  20 UNITS in PM DAILY  ? Insulin Pen Needle 32G X 4 MM MISC Use 4x a day  ? JARDIANCE 25 MG TABS tablet TAKE 1 TABLET BY MOUTH EVERY DAY BEFORE BREAKFAST  ? Lancets (ONETOUCH DELICA PLUS [id number]) MISC TEST 3 TIMES DAILY  ? losartan (COZAAR) 100 MG tablet Take 1 tablet (100 mg total) by mouth daily.  ? MAGNESIUM PO Take 1,000 mg by mouth daily.  ? MELATONIN PO Take 1 capsule by mouth at bedtime.  ? Multiple Vitamin (MULTIVITAMIN) tablet Take 1 tablet by mouth daily.  ? Semaglutide, 1 MG/DOSE, (OZEMPIC, 1 MG/DOSE,) 4 MG/3ML SOPN Inject 1 mg into the skin once a week.  ? Turmeric (QC TUMERIC COMPLEX PO) Take 1 capsule by mouth daily.  ? vitamin B-12 (CYANOCOBALAMIN) 100 MCG tablet Take 100 mcg by mouth daily.  ? Vitamin D, Cholecalciferol, 1000 UNITS TABS Take 4,000 Units by mouth daily. otc  ?  ? ?Allergies:    ?Humalog [insulin lispro] and Latex  ? ?Social History:   ?Social History  ? ?Tobacco Use  ? Smoking status: Never  ? Smokeless tobacco: Never  ?Vaping Use  ? Vaping Use: Never used  ?Substance Use Topics  ? Alcohol use: No  ? Drug use: No  ?  ? ?Family Hx: ?Family History  ?Problem Relation Age of Onset  ? Hypertension Mother   ? Diabetes Mother   ? Thyroid disease Mother   ? Diabetes Father   ? Cancer Father   ?     Colon cancer  ? Hypertension Father   ? Diabetes Sister   ?  ? ?Review of Systems:   ?Please see the history of present illness.    ?All other systems reviewed and are negative. ?  ? ? ?EKGs/Labs/Other Test Reviewed:   ? ?EKG: 2019 sinus bradycardia; EKG today demonstrates sinus rhythm ? ?Prior CV studies: ? ?TTE 3/23 ? 1. Left ventricular ejection fraction, by estimation, is 45 to 50%. The  ?left ventricle has mildly decreased function. The left ventricle  ?demonstrates regional wall motion abnormalities (see scoring  ?diagram/findings for  description). Left ventricular  ?diastolic parameters were normal. There is mild hypokinesis of the left  ?ventricular, mid anteroseptal wall. There is moderate hypokinesis of the  ?left ventricular, basal-mid inferolateral wall.  ? 2. Right ventricular systolic function is normal. The right ventricular  ?size is normal. There is normal pulmonary artery systolic pressure.  ? 3. The mitral valve is normal in structure. Mild mitral valve  ?regurgitation. No evidence of mitral stenosis.  ? 4. The aortic valve is tricuspid. There is mild calcification of the  ?aortic valve. Aortic valve regurgitation is not visualized. No aortic  ?stenosis is present.  ? 5. The inferior vena cava is normal in size with greater than 50%  ?respiratory variability, suggesting right atrial pressure of 3 mmHg. ? ?Imaging studies that I have independently reviewed today: Echocardiogram ? ?Recent Labs: ?11/09/2020: TSH 2.77 ?08/12/2021:  ALT 33; BUN 16; Creatinine, Ser 0.97; Hemoglobin 13.6; Platelets 162.0; Potassium 3.8; Sodium 137  ? ?Recent Lipid Panel ?Lab Results  ?Component Value Date/Time  ? CHOL 151 07/18/2021 04:12 PM  ? CHOL 135 09/01/2017 09:53 AM  ? TRIG 86.0 07/18/2021 04:12 PM  ? HDL 52.00 07/18/2021 04:12 PM  ? HDL 41 09/01/2017 09:53 AM  ? LDLCALC 82 07/18/2021 04:12 PM  ? LDLCALC 68 11/09/2020 02:42 PM  ? ? ?Risk Assessment/Calculations:   ? ?  ?    ? ?Physical Exam:   ? ?VS:  BP 136/82   Pulse 75   Ht _0  (1.575 m)   Wt 172 lb 12.8 oz (78.4 kg)   LMP  (LMP Unknown)   SpO2 98%   BMI 31.61 kg/m?    ?Wt Readings from Last 3 Encounters:  ?09/02/21 172 lb 12.8 oz (78.4 kg)  ?08/12/21 172 lb 8 oz (78.2 kg)  ?07/29/21 173 lb (78.5 kg)  ?  ?GENERAL:  No apparent distress, AOx3 ?HEENT:  No carotid bruits, +2 carotid impulses, no scleral icterus ?CAR: RRR no murmurs, gallops, rubs, or thrills ?RES:  Clear to auscultation bilaterally ?ABD:  Soft, nontender, nondistended, positive bowel sounds x 4 ?VASC:  +2 radial pulses, +2  carotid pulses, palpable pedal pulses ?NEURO:  CN 2-12 grossly intact; motor and sensory grossly intact ?PSYCH:  No active depression or anxiety ?EXT:  No edema, ecchymosis, or cyanosis ? ?Signed, ?Early Osmond, MD

## 2021-09-02 NOTE — Patient Instructions (Addendum)
Medication Instructions:  ?Please start Metoprolol Succinate 25 mg at bedtime. ?Start Atorvastatin 40 mg daily. ?Continue all other medications as listed. ? ?*If you need a refill on your cardiac medications before your next appointment, please call your pharmacy* ? ? ?Lab Work: ?Please have blood work on Friday (CBC, BMP) 3/24. ?Please have blood work in 2 months (Lipid/Hepatic panel) ? ?If you have labs (blood work) drawn today and your tests are completely normal, you will receive your results only by: ?MyChart Message (if you have MyChart) OR ?A paper copy in the mail ?If you have any lab test that is abnormal or we need to change your treatment, we will call you to review the results. ? ? ?Testing/Procedures: ? ? ?[facility] ?[address], [address] ?[place] [address] ?Dept: [phone] ?LocPK:[id number] ? ?Kristen [name]  09/02/2021 ? ?You are scheduled for a cardiac cath on Friday, March 31 with Dr. Lenna Sciara. ? ?1. Please arrive at the [facility] (Main Entrance A) at [facility]: [address] [place], [place] [address] at 5:30 AM (two hours before your procedure to ensure your preparation). Free valet parking service is available.  ? ?Special note: Every effort is made to have your procedure done on time. Please understand that emergencies sometimes delay scheduled procedures. ? ?2. Diet: Do not eat or drink anything after midnight prior to your procedure except sips of water to take medications. ? ?3. Labs:  as scheduled  (09/06/21) ? ?4. Medication instructions in preparation for your procedure: ? ?Only take 1/2 your normal insulin dosage. ?Do not take your oral medication for DM this morning. ?Do not take your Hydrochlorothiazide this morning. ? ?On the morning of your procedure, take your Aspirin and any morning medicines NOT listed above.  You may use sips of water. ? ?5. Plan for one night  stay--bring personal belongings. ?6. Bring a current list of your medications and current insurance cards. ?7. You MUST have a responsible person to drive you home. ?8. Someone MUST be with you the first 24 hours after you arrive home or your discharge will be delayed. ?9. Please wear clothes that are easy to get on and off and wear slip-on shoes. ? ?Thank you for allowing [place] to care for you! ?  -- [facility] ? ?Follow-Up: ?At [facility], you and your health needs are our priority.  As part of our continuing mission to provide you with exceptional heart care, we have created designated Provider Care Teams.  These Care Teams include your primary Cardiologist (physician) and Advanced Practice Providers (APPs -  Physician Assistants and Nurse Practitioners) who all work together to provide you with the care you need, when you need it. ? ?We recommend signing up for the patient portal called "MyChart".  Sign up information is provided on this After Visit Summary.  MyChart is used to connect with patients for Virtual Visits (Telemedicine).  Patients are able to view lab/test results, encounter notes, upcoming appointments, etc.  Non-urgent messages can be sent to your provider as well.   ?To learn more about what you can do with MyChart, go to [email or web address].   ? ?Your next appointment:   ?3 month(s) ? ?The format for your next appointment:   ?In Person ? ?Provider:   ?Dr Ali Lowe ? ?If primary card or EP is not listed click here to update    :1}  ? ?  Thank you for choosing [facility]!! ? ? ? ?

## 2021-09-02 NOTE — H&P (View-Only) (Signed)
?Cardiology Office Note:   ? ?Date:  09/02/2021  ? ?ID:  Jahari M Disney, DOB 02/27/1951, MRN [id number] ? ?PCP:  Worley, Samantha, PA  ? ?[facility] Providers ?Cardiologist:  Jasline Buskirk, MD ?Referring MD: Worley, Samantha, PA  ? ?Chief Complaint/Reason for Referral: Abnormal echocardiogram ? ?ASSESSMENT:   ? ?1. Cardiomyopathy, unspecified type ([facility])   ?2. Type 2 diabetes mellitus without complication, with long-term current use of insulin ([facility])   ?3. Hypertension associated with diabetes ([facility])   ?4. Hyperlipidemia associated with type 2 diabetes mellitus ([facility])   ? ? ?PLAN:   ? ?In order of problems listed above: ?1.  We will refer for cardiac catheterization to evaluate and assess further.  Continue losartan, Jardiance, and will start Toprol XL 25mg QPM.  Follow up 3 months. ?2.  Continue losartan, Jardiance, and start aspirin 81 mg and atorvastatin 40 mg given her history of diabetes. ?3.  Continue losartan and hydrochlorothiazide with goal blood pressure of less than 130/80 mmHg ?4.  Start atorvastatin as detailed above with goal LDL of less than 70.  Check lipid panel and LFTs in 2 months. ? ? ?     ? ?Shared Decision Making/Informed Consent ?The risks [stroke (1 in 1000), death (1 in 1000), kidney failure [usually temporary] (1 in 500), bleeding (1 in 200), allergic reaction [possibly serious] (1 in 200)], benefits (diagnostic support and management of coronary artery disease) and alternatives of a cardiac catheterization were discussed in detail with Ms. Pirani and she is willing to proceed.  ? ?Dispo:  Return in about 3 months (around 12/03/2021).  ? ?  ? ?Medication Adjustments/Labs and Tests Ordered: ?Current medicines are reviewed at length with the patient today.  Concerns regarding medicines are outlined above.  ? ?Tests Ordered: ?Orders Placed This Encounter  ?Procedures  ? EKG 12-Lead  ? ? ?Medication Changes: ?No orders of the defined types were placed in this encounter. ? ? ?History of Present  Illness:   ? ?FOCUSED PROBLEM LIST:   ?1.  Cardiomyopathy with ejection fraction of 45 to 50% with multiple wall motion abnormalities on echocardiogram March 2023 ?2.  Type 2 diabetes on insulin ?3.  Hypertension ?4.  Hyperlipidemia ? ?The patient is a 71 y.o. female with the indicated medical history here for recommendations regarding abnormal echocardiogram.  Patient was seen by her primary care provider recently.  An echocardiogram was ordered which demonstrated abnormalities as detailed above.  Echocardiogram was ordered because there was consideration to start potential immunomodulatory therapy for psoriasis.  Time there has been a reconsideration regarding the diagnosis of psoriasis.  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  She has a relatively sedentary job.  She does not exercise.  She denies any exertional angina, exertional dyspnea, presyncope, syncope, palpitations, paroxysmal nocturnal dyspnea, orthopnea.  She did have lower extremity swelling that seemed to improve after the introduction of hydrochlorothiazide.  She has required no emergency room visits or hospitalizations.  She is otherwise well and without significant complaints. ? ?She tells me she abstains from tobacco and alcohol.  She is trying to increase her physical activity with more regular exercise over the last couple weeks as well. ? ?   ?Current Medications: ?Current Meds  ?Medication Sig  ? Ascorbic Acid (VITAMIN C) 1000 MG tablet Take 4,000 mg by mouth daily.  ? aspirin EC 81 MG tablet Take 81 mg by mouth daily. Swallow whole.  ? Blood Glucose Monitoring Suppl (  ONE TOUCH ULTRA 2) w/Device KIT Use to check blood sugar 3 times daily  ? clarithromycin (BIAXIN) 500 MG tablet Take 500 mg by mouth 2 (two) times daily.  ? COLLAGEN PO Take by mouth 3 (three) times a week.  ? glucose blood (ONETOUCH ULTRA) test strip USE AS INSTRUCTED TO CHECK  SUGAR 3 TIMES DAILY  ? hydrochlorothiazide  (HYDRODIURIL) 25 MG tablet TAKE 1 TABLET BY MOUTH DAILY  ? insulin glargine (LANTUS SOLOSTAR) 100 UNIT/ML Solostar Pen INJECT under skin 40 UNITS in AM and  20 UNITS in PM DAILY  ? Insulin Pen Needle 32G X 4 MM MISC Use 4x a day  ? JARDIANCE 25 MG TABS tablet TAKE 1 TABLET BY MOUTH EVERY DAY BEFORE BREAKFAST  ? Lancets (ONETOUCH DELICA PLUS [id number]) MISC TEST 3 TIMES DAILY  ? losartan (COZAAR) 100 MG tablet Take 1 tablet (100 mg total) by mouth daily.  ? MAGNESIUM PO Take 1,000 mg by mouth daily.  ? MELATONIN PO Take 1 capsule by mouth at bedtime.  ? Multiple Vitamin (MULTIVITAMIN) tablet Take 1 tablet by mouth daily.  ? Semaglutide, 1 MG/DOSE, (OZEMPIC, 1 MG/DOSE,) 4 MG/3ML SOPN Inject 1 mg into the skin once a week.  ? Turmeric (QC TUMERIC COMPLEX PO) Take 1 capsule by mouth daily.  ? vitamin B-12 (CYANOCOBALAMIN) 100 MCG tablet Take 100 mcg by mouth daily.  ? Vitamin D, Cholecalciferol, 1000 UNITS TABS Take 4,000 Units by mouth daily. otc  ?  ? ?Allergies:    ?Humalog [insulin lispro] and Latex  ? ?Social History:   ?Social History  ? ?Tobacco Use  ? Smoking status: Never  ? Smokeless tobacco: Never  ?Vaping Use  ? Vaping Use: Never used  ?Substance Use Topics  ? Alcohol use: No  ? Drug use: No  ?  ? ?Family Hx: ?Family History  ?Problem Relation Age of Onset  ? Hypertension Mother   ? Diabetes Mother   ? Thyroid disease Mother   ? Diabetes Father   ? Cancer Father   ?     Colon cancer  ? Hypertension Father   ? Diabetes Sister   ?  ? ?Review of Systems:   ?Please see the history of present illness.    ?All other systems reviewed and are negative. ?  ? ? ?EKGs/Labs/Other Test Reviewed:   ? ?EKG: 2019 sinus bradycardia; EKG today demonstrates sinus rhythm ? ?Prior CV studies: ? ?TTE 3/23 ? 1. Left ventricular ejection fraction, by estimation, is 45 to 50%. The  ?left ventricle has mildly decreased function. The left ventricle  ?demonstrates regional wall motion abnormalities (see scoring  ?diagram/findings for  description). Left ventricular  ?diastolic parameters were normal. There is mild hypokinesis of the left  ?ventricular, mid anteroseptal wall. There is moderate hypokinesis of the  ?left ventricular, basal-mid inferolateral wall.  ? 2. Right ventricular systolic function is normal. The right ventricular  ?size is normal. There is normal pulmonary artery systolic pressure.  ? 3. The mitral valve is normal in structure. Mild mitral valve  ?regurgitation. No evidence of mitral stenosis.  ? 4. The aortic valve is tricuspid. There is mild calcification of the  ?aortic valve. Aortic valve regurgitation is not visualized. No aortic  ?stenosis is present.  ? 5. The inferior vena cava is normal in size with greater than 50%  ?respiratory variability, suggesting right atrial pressure of 3 mmHg. ? ?Imaging studies that I have independently reviewed today: Echocardiogram ? ?Recent Labs: ?11/09/2020: TSH 2.77 ?08/12/2021:   ALT 33; BUN 16; Creatinine, Ser 0.97; Hemoglobin 13.6; Platelets 162.0; Potassium 3.8; Sodium 137  ? ?Recent Lipid Panel ?Lab Results  ?Component Value Date/Time  ? CHOL 151 07/18/2021 04:12 PM  ? CHOL 135 09/01/2017 09:53 AM  ? TRIG 86.0 07/18/2021 04:12 PM  ? HDL 52.00 07/18/2021 04:12 PM  ? HDL 41 09/01/2017 09:53 AM  ? LDLCALC 82 07/18/2021 04:12 PM  ? LDLCALC 68 11/09/2020 02:42 PM  ? ? ?Risk Assessment/Calculations:   ? ?  ?    ? ?Physical Exam:   ? ?VS:  BP 136/82   Pulse 75   Ht 5' 2" (1.575 m)   Wt 172 lb 12.8 oz (78.4 kg)   LMP  (LMP Unknown)   SpO2 98%   BMI 31.61 kg/m?    ?Wt Readings from Last 3 Encounters:  ?09/02/21 172 lb 12.8 oz (78.4 kg)  ?08/12/21 172 lb 8 oz (78.2 kg)  ?07/29/21 173 lb (78.5 kg)  ?  ?GENERAL:  No apparent distress, AOx3 ?HEENT:  No carotid bruits, +2 carotid impulses, no scleral icterus ?CAR: RRR no murmurs, gallops, rubs, or thrills ?RES:  Clear to auscultation bilaterally ?ABD:  Soft, nontender, nondistended, positive bowel sounds x 4 ?VASC:  +2 radial pulses, +2  carotid pulses, palpable pedal pulses ?NEURO:  CN 2-12 grossly intact; motor and sensory grossly intact ?PSYCH:  No active depression or anxiety ?EXT:  No edema, ecchymosis, or cyanosis ? ?Signed, ?Kaitlyne Friedhoff K Ladon Heney, MD

## 2021-09-03 ENCOUNTER — Telehealth: Payer: Self-pay | Admitting: Physician Assistant

## 2021-09-03 NOTE — Telephone Encounter (Signed)
Type of form received: ?Fmla  ? ?Additional comments:  ?Patient needs it by Friday and she would like a copy faxed and a copy to pick up ?Received by: ?[place]  ?Form should be Faxed to: ?[phone] ?Form should be mailed to:   ? ?Is patient requesting call for pickup: ?[phone] ? ?Form placed:   ?In providers box  ?Attach charge sheet.  ?Yes  ?Individual made aware of 3-5 business day turn around (Y/N)? ?Yes  ? ?

## 2021-09-04 DIAGNOSIS — Z0279 Encounter for issue of other medical certificate: Secondary | ICD-10-CM

## 2021-09-04 NOTE — Telephone Encounter (Signed)
Pt has been contacted to come and pick up the original forms. I have also faxed a copy to the # that she provided. The originals will be place up front for pick up. ?

## 2021-09-06 ENCOUNTER — Other Ambulatory Visit: Payer: Self-pay

## 2021-09-06 ENCOUNTER — Other Ambulatory Visit: Payer: 59 | Admitting: *Deleted

## 2021-09-06 ENCOUNTER — Encounter: Payer: 59 | Admitting: Physician Assistant

## 2021-09-06 DIAGNOSIS — I152 Hypertension secondary to endocrine disorders: Secondary | ICD-10-CM

## 2021-09-06 DIAGNOSIS — Z01812 Encounter for preprocedural laboratory examination: Secondary | ICD-10-CM

## 2021-09-06 DIAGNOSIS — I429 Cardiomyopathy, unspecified: Secondary | ICD-10-CM

## 2021-09-06 DIAGNOSIS — E1159 Type 2 diabetes mellitus with other circulatory complications: Secondary | ICD-10-CM

## 2021-09-06 LAB — BASIC METABOLIC PANEL
BUN/Creatinine Ratio: 32 — ABNORMAL HIGH (ref 12–28)
BUN: 19 mg/dL (ref 8–27)
CO2: 24 mmol/L (ref 20–29)
Calcium: 9.2 mg/dL (ref 8.7–10.3)
Chloride: 102 mmol/L (ref 96–106)
Creatinine, Ser: 0.59 mg/dL (ref 0.57–1.00)
Glucose: 98 mg/dL (ref 70–99)
Potassium: 4.8 mmol/L (ref 3.5–5.2)
Sodium: 138 mmol/L (ref 134–144)
eGFR: 97 mL/min/{1.73_m2} (ref >59)

## 2021-09-06 LAB — CBC
Hematocrit: 41.2 % (ref 34.0–46.6)
Hemoglobin: 13.6 g/dL (ref 11.1–15.9)
MCH: 29.9 pg (ref 26.6–33.0)
MCHC: 33 g/dL (ref 31.5–35.7)
MCV: 91 fL (ref 79–97)
Platelets: 185 10*3/uL (ref 150–450)
RBC: 4.55 x10E6/uL (ref 3.77–5.28)
RDW: 12.4 % (ref 11.7–15.4)
WBC: 6 10*3/uL (ref 3.4–10.8)

## 2021-09-11 ENCOUNTER — Telehealth: Payer: Self-pay | Admitting: Internal Medicine

## 2021-09-11 NOTE — Telephone Encounter (Signed)
Reached patient and reviewed her cath instructions for 09/14/19.  She will call if she thinks of any other questions or concerns. ?

## 2021-09-11 NOTE — Telephone Encounter (Signed)
Pt has questions about her upcoming procedure... please advise ?

## 2021-09-12 ENCOUNTER — Telehealth: Payer: Self-pay | Admitting: *Deleted

## 2021-09-12 NOTE — Telephone Encounter (Signed)
Cardiac Catheterization scheduled at [facility] for: Friday September 13, 2021 7:30 AM ?Arrival time and place: [facility] Main Entrance A at: 5:30 AM ? ? ?No solid food after midnight prior to cath, clear liquids until 5 AM day of procedure. ? ?Medication instructions: ?-Hold: ? Insulin-AM of procedure/1/2 usual Insulin HS prior to procedure ? Jardiance-AM of procedure ? Patient reports she is not currently taking HCTZ ?-Except hold medications usual morning medications can be taken with sips of water including aspirin 81 mg. ? ?Confirmed patient has responsible adult to drive home post procedure and be with patient first 24 hours after arriving home. ? ?Patient reports no new symptoms concerning for COVID-19/no exposure to COVID-19 in the past 10 days. ? ?Reviewed procedure instructions with patient. ? ?

## 2021-09-13 ENCOUNTER — Other Ambulatory Visit: Payer: Self-pay

## 2021-09-13 ENCOUNTER — Encounter (HOSPITAL_COMMUNITY): Payer: Self-pay | Admitting: Internal Medicine

## 2021-09-13 ENCOUNTER — Encounter (HOSPITAL_COMMUNITY)
Admission: RE | Disposition: A | Discharge status: Home / Self Care | Payer: Self-pay | Source: Ambulatory Visit | Attending: Internal Medicine

## 2021-09-13 ENCOUNTER — Ambulatory Visit (HOSPITAL_COMMUNITY)
Admission: RE | Admit: 2021-09-13 | Discharge: 2021-09-13 | Disposition: A | Discharge status: Home / Self Care | Payer: 59 | Source: Ambulatory Visit | Attending: Internal Medicine | Admitting: Internal Medicine

## 2021-09-13 DIAGNOSIS — E119 Type 2 diabetes mellitus without complications: Secondary | ICD-10-CM | POA: Insufficient documentation

## 2021-09-13 DIAGNOSIS — E785 Hyperlipidemia, unspecified: Secondary | ICD-10-CM | POA: Insufficient documentation

## 2021-09-13 DIAGNOSIS — I429 Cardiomyopathy, unspecified: Secondary | ICD-10-CM | POA: Insufficient documentation

## 2021-09-13 DIAGNOSIS — I10 Essential (primary) hypertension: Secondary | ICD-10-CM | POA: Diagnosis not present

## 2021-09-13 DIAGNOSIS — I2510 Atherosclerotic heart disease of native coronary artery without angina pectoris: Secondary | ICD-10-CM | POA: Diagnosis not present

## 2021-09-13 DIAGNOSIS — Z794 Long term (current) use of insulin: Secondary | ICD-10-CM | POA: Diagnosis not present

## 2021-09-13 DIAGNOSIS — Z7982 Long term (current) use of aspirin: Secondary | ICD-10-CM | POA: Diagnosis not present

## 2021-09-13 DIAGNOSIS — Z79899 Other long term (current) drug therapy: Secondary | ICD-10-CM | POA: Diagnosis not present

## 2021-09-13 DIAGNOSIS — Z7984 Long term (current) use of oral hypoglycemic drugs: Secondary | ICD-10-CM | POA: Insufficient documentation

## 2021-09-13 DIAGNOSIS — Z7985 Long-term (current) use of injectable non-insulin antidiabetic drugs: Secondary | ICD-10-CM | POA: Diagnosis not present

## 2021-09-13 HISTORY — PX: LEFT HEART CATH AND CORONARY ANGIOGRAPHY: CATH118249

## 2021-09-13 LAB — GLUCOSE, CAPILLARY: Glucose-Capillary: 105 mg/dL — ABNORMAL HIGH (ref 70–99)

## 2021-09-13 SURGERY — LEFT HEART CATH AND CORONARY ANGIOGRAPHY
Anesthesia: LOCAL

## 2021-09-13 MED ORDER — VERAPAMIL HCL 2.5 MG/ML IV SOLN
INTRAVENOUS | Status: DC | PRN
  Administered 2021-09-13: 5 mL via INTRA_ARTERIAL

## 2021-09-13 MED ORDER — VERAPAMIL HCL 2.5 MG/ML IV SOLN
INTRAVENOUS | Status: AC
  Filled 2021-09-13: qty 2

## 2021-09-13 MED ORDER — SODIUM CHLORIDE 0.9% FLUSH: 3.0000 mL | Freq: Two times a day (BID) | INTRAVENOUS | Status: DC

## 2021-09-13 MED ORDER — MAGNESIUM 30 MG PO TABS: 30.0000 mg | ORAL_TABLET | Freq: Every evening | ORAL | 6 refills | Status: DC

## 2021-09-13 MED ORDER — LIDOCAINE HCL (PF) 1 % IJ SOLN
INTRAMUSCULAR | Status: AC
  Filled 2021-09-13: qty 30

## 2021-09-13 MED ORDER — SODIUM CHLORIDE 0.9 % IV SOLN: INTRAVENOUS | Status: DC

## 2021-09-13 MED ORDER — SODIUM CHLORIDE 0.9% FLUSH: 3.0000 mL | INTRAVENOUS | Status: DC | PRN

## 2021-09-13 MED ORDER — HYDRALAZINE HCL 20 MG/ML IJ SOLN: 10.0000 mg | INTRAMUSCULAR | Status: DC | PRN

## 2021-09-13 MED ORDER — HEPARIN SODIUM (PORCINE) 1000 UNIT/ML IJ SOLN
INTRAMUSCULAR | Status: AC
  Filled 2021-09-13: qty 10

## 2021-09-13 MED ORDER — SODIUM CHLORIDE 0.9 % IV SOLN: 250.0000 mL | INTRAVENOUS | Status: DC | PRN

## 2021-09-13 MED ORDER — FENTANYL CITRATE (PF) 100 MCG/2ML IJ SOLN
INTRAMUSCULAR | Status: DC | PRN
  Administered 2021-09-13: 25 ug via INTRAVENOUS

## 2021-09-13 MED ORDER — ACETAMINOPHEN 325 MG PO TABS: 650.0000 mg | ORAL_TABLET | ORAL | Status: DC | PRN

## 2021-09-13 MED ORDER — MIDAZOLAM HCL 2 MG/2ML IJ SOLN
INTRAMUSCULAR | Status: DC | PRN
  Administered 2021-09-13: 1 mg via INTRAVENOUS

## 2021-09-13 MED ORDER — HEPARIN SODIUM (PORCINE) 1000 UNIT/ML IJ SOLN
INTRAMUSCULAR | Status: DC | PRN
  Administered 2021-09-13: 5000 [IU] via INTRAVENOUS

## 2021-09-13 MED ORDER — MIDAZOLAM HCL 2 MG/2ML IJ SOLN
INTRAMUSCULAR | Status: AC
  Filled 2021-09-13: qty 2

## 2021-09-13 MED ORDER — IOHEXOL 350 MG/ML SOLN
INTRAVENOUS | Status: DC | PRN
  Administered 2021-09-13: 45 mL

## 2021-09-13 MED ORDER — ASPIRIN 81 MG PO CHEW: 81.0000 mg | CHEWABLE_TABLET | ORAL | Status: DC

## 2021-09-13 MED ORDER — LABETALOL HCL 5 MG/ML IV SOLN: 10.0000 mg | INTRAVENOUS | Status: DC | PRN

## 2021-09-13 MED ORDER — LIDOCAINE HCL (PF) 1 % IJ SOLN
INTRAMUSCULAR | Status: DC | PRN
  Administered 2021-09-13: 2 mL

## 2021-09-13 MED ORDER — FENTANYL CITRATE (PF) 100 MCG/2ML IJ SOLN
INTRAMUSCULAR | Status: AC
  Filled 2021-09-13: qty 2

## 2021-09-13 MED ORDER — ONDANSETRON HCL 4 MG/2ML IJ SOLN: 4.0000 mg | Freq: Four times a day (QID) | INTRAMUSCULAR | Status: DC | PRN

## 2021-09-13 MED ORDER — HEPARIN (PORCINE) IN NACL 1000-0.9 UT/500ML-% IV SOLN
INTRAVENOUS | Status: DC | PRN
  Administered 2021-09-13 (×2): 500 mL

## 2021-09-13 MED ORDER — HEPARIN (PORCINE) IN NACL 1000-0.9 UT/500ML-% IV SOLN
INTRAVENOUS | Status: AC
  Filled 2021-09-13: qty 1000

## 2021-09-13 SURGICAL SUPPLY — 10 items
BAND CMPR LRG ZPHR (HEMOSTASIS) ×1
BAND ZEPHYR COMPRESS 30 LONG (HEMOSTASIS) ×1 IMPLANT
CATH DIAG 6FR JR4 (CATHETERS) ×1 IMPLANT
CATH INFINITI 6F FL3.5 (CATHETERS) ×1 IMPLANT
GLIDESHEATH SLEND SS 6F .021 (SHEATH) ×1 IMPLANT
KIT HEART LEFT (KITS) ×3 IMPLANT
PACK CARDIAC CATHETERIZATION (CUSTOM PROCEDURE TRAY) ×3 IMPLANT
TRANSDUCER W/STOPCOCK (MISCELLANEOUS) ×3 IMPLANT
TUBING CIL FLEX 10 FLL-RA (TUBING) ×3 IMPLANT
WIRE EMERALD 3MM-J .035X150CM (WIRE) ×1 IMPLANT

## 2021-09-13 NOTE — Interval H&P Note (Signed)
History and Physical Interval Note: ? ?09/13/2021 ?7:01 AM ? ?Kristen [name]  has presented today for surgery, with the diagnosis of cardiomyopathy.  The various methods of treatment have been discussed with the patient and family. After consideration of risks, benefits and other options for treatment, the patient has consented to  Procedure(s): ?LEFT HEART CATH AND CORONARY ANGIOGRAPHY (N/A) as a surgical intervention.  The patient's history has been reviewed, patient examined, no change in status, stable for surgery.  I have reviewed the patient's chart and labs.  Questions were answered to the patient's satisfaction.   ? ?Cath Lab Visit (complete for each Cath Lab visit) ? ?Clinical Evaluation Leading to the Procedure:  ? ?ACS: No. ? ?Non-ACS:   ? ?Anginal Classification: No Symptoms ? ?Anti-ischemic medical therapy: Minimal Therapy (1 class of medications) ? ?Non-Invasive Test Results: Intermediate-risk stress test findings: cardiac mortality 1-3%/year ? ?Prior CABG: No previous CABG ? ? ? ? ? ? ? ?Kristen [name] ? ? ?

## 2021-09-13 NOTE — Progress Notes (Signed)
Patient and son was given discharge instructions, both verbalized understanding. ?

## 2021-09-13 NOTE — Discharge Instructions (Signed)

## 2021-09-16 ENCOUNTER — Other Ambulatory Visit: Payer: Self-pay | Admitting: Internal Medicine

## 2021-09-16 NOTE — Telephone Encounter (Signed)
Pharmacy is stating that the medication magnesium 30 mg tablets are on backordered and they wanted to know if Dr. Lynnette Caffey wanted to prescribe another medication? Please address ?

## 2021-09-17 ENCOUNTER — Other Ambulatory Visit: Payer: 59

## 2021-09-17 NOTE — Telephone Encounter (Signed)
From Dr. Ali Lowe: ?Sure, whatever the pharmacy has at a regular dose is fine  ? ?Replaced with Mag Oxide 400 mg per Dr. Ali Lowe. ?

## 2021-09-27 ENCOUNTER — Ambulatory Visit: Payer: 59 | Admitting: Internal Medicine

## 2021-09-27 ENCOUNTER — Other Ambulatory Visit: Payer: Self-pay | Admitting: Family Medicine

## 2021-09-27 ENCOUNTER — Encounter: Payer: Self-pay | Admitting: Internal Medicine

## 2021-09-27 VITALS — BP 128/82 | HR 77 | Ht 61.5 in | Wt 170.0 lb

## 2021-09-27 DIAGNOSIS — Z794 Long term (current) use of insulin: Secondary | ICD-10-CM | POA: Diagnosis not present

## 2021-09-27 DIAGNOSIS — E11319 Type 2 diabetes mellitus with unspecified diabetic retinopathy without macular edema: Secondary | ICD-10-CM | POA: Diagnosis not present

## 2021-09-27 DIAGNOSIS — E669 Obesity, unspecified: Secondary | ICD-10-CM | POA: Diagnosis not present

## 2021-09-27 LAB — POCT GLYCOSYLATED HEMOGLOBIN (HGB A1C): Hemoglobin A1C: 7 % — AB (ref 4.0–5.6)

## 2021-09-27 MED ORDER — OZEMPIC (1 MG/DOSE) 4 MG/3ML ~~LOC~~ SOPN: 1.0000 mg | PEN_INJECTOR | SUBCUTANEOUS | 3 refills | Status: DC

## 2021-09-27 NOTE — Progress Notes (Signed)
Patient ID: Kristen [name], female   DOB: 08/05/1950, 71 y.o.   MRN: [id number] ? ?This visit occurred during the SARS-CoV-2 public health emergency.  Safety protocols were in place, including screening questions prior to the visit, additional usage of staff PPE, and extensive cleaning of exam room while observing appropriate contact time as indicated for disinfecting solutions.  ? ?HPI: ?Kristen [name] is a 71 y.o.-year-old female, returning for f/u for DM2, dx in 2000, insulin-dependent since 2012, uncontrolled, with complications (DR).  Last visit 4.5 months ago. ? ?Interim history: ?No increased urination, blurry vision, nausea, chest pain. ?She was recently found to have cardiomyopathy with a lower ejection fraction: 45-50%. She had a heart cath 09/13/2021 >> no blockages.  ?She is planning to start a plant based diet.  She already started to eat more vegetables.  She saw an improvement in blood sugars and reduce her insulin. ? ?Reviewed HbA1c levels: ?Lab Results  ?Component Value Date  ? HGBA1C 7.9 (A) 05/17/2021  ? HGBA1C 7.4 (H) 11/09/2020  ? HGBA1C 7.6 (A) 08/27/2020  ? HGBA1C 7.3 (A) 04/18/2020  ? HGBA1C 7.7 (H) 11/24/2019  ? HGBA1C 7.6 (H) 03/02/2019  ? HGBA1C 6.7 (A) 06/10/2018  ? HGBA1C 6.9 (A) 02/25/2018  ? HGBA1C 8.6 (A) 10/22/2017  ? HGBA1C 8.4 07/24/2017  ? HGBA1C 9.4 04/23/2017  ? HGBA1C 8.7 11/21/2016  ? HGBA1C 8.5 08/22/2016  ? HGBA1C 9.3 04/09/2016  ? HGBA1C 9.3 10/30/2015  ? HGBA1C 8.6 07/18/2015  ? HGBA1C 9.9 04/16/2015  ? HGBA1C 9.5 09/21/2014  ? HGBA1C 11.4 05/18/2014  ? HGBA1C 9.4 08/26/2013  ? ?She is on: ?- Lantus 40 units in the morning >> 40 units 2x a day >> 40 units in am and 20 at night >> 40 units in am ?- Jardiance 25 mg before breakfast >> restarted 11/2020 ?- Ozempic 1 mg weekly (did not start Mounjaro 7.5 mg weekly, although it was approved for her...) ?She stopped  Victoza b/c $$$. Trulicity not covered. ?She was on Jardiance 25 mg daily before b'fast. - added 03/2016 - stopped  06/2016  >> sugars increased >> at last visit, I advised her to restart >> did not do so as this was expensive. ?She tried Metformin ER 500 mg >> still N/D/AP. ?She was previously on Basaglar 45 units daily.  She was also previously on Humalog 20 to 25 units before lunch and dinner. ?She was previously on Ozempic -started 04/2020, then off, then restarted 11/2020.  She could not continue due to price. ? ?Pt checks her sugars 3-4 times a day: ?- am: 109-148 >> 105-128, 175, 180 (missed insulin at night) >> 104-110, 158 ?- 2h after b'fast:  152  >> n/c >> 267 >> n/c ?- before lunch: 130-135 >> 130 (w/o Humalog) >> 180 >> n/c ?- 2h after lunch:  130-165, 180 >> 161-201 >> n/c ?- before dinner:  73, 110-173 >> n/c >> 125-132 >> n/c  ?- 2h after dinner: 280-295 >> n/c >> 175-187 >> 180-189, 250 ?- bedtime: 175-200 >> 173, 180 >> 160s >> n/c ?- nighttime: 65 >> n/c >> 240-285 >> n/c ?Lowest sugar was 90 >> 140 >> 105 >> 104; she has hypoglycemia awareness in the 70s. ?Highest sugar was 201 >> 285 >> 250. ? ?Glucometer: AccuChek ? ?She does intermittent fasting. Meals: ?- brunch: grits + meat + 2 eggs ?- +/- snack tuna, boiled egg ?- dinner: meat + 3 veggies ? ?-No CKD, last BUN/creatinine:  ?Lab Results  ?Component Value  Date  ? BUN 19 09/06/2021  ? CREATININE 0.59 09/06/2021  ?On losartan. ? ?-+ HL; last set of lipids: ?Lab Results  ?Component Value Date  ? CHOL 151 07/18/2021  ? HDL 52.00 07/18/2021  ? [place] 82 07/18/2021  ? TRIG 86.0 07/18/2021  ? CHOLHDL 3 07/18/2021  ?Not on a statin.  She did not start Lipitor 40 mg daily as advised. ? ?- last eye exam was in 06/2021: + DR - improved, "healed" reportedly, + glaucoma, + small cataract. Dr. Phineas Real ([place]  She also sees a retina specialist - [facility]. ? ?-She denies numbness and tingling in her feet. ? ?She also has GERD, HTN. ? ?ROS: ?+ see HPI ? ?I reviewed pt's medications, allergies, PMH, social hx, family hx, and changes were documented in the  history of present illness. Otherwise, unchanged from my initial visit note. ? ?Past Medical History:  ?Diagnosis Date  ? Colon polyps   ? Diabetes mellitus   ? Glaucoma   ? History of chicken pox   ? HTN (hypertension)   ? Meralgia paresthetica   ? ?Past Surgical History:  ?Procedure Laterality Date  ? BREAST CYST EXCISION Left   ? patient states btw [phone]  ? CHOLECYSTECTOMY  06/16/2008  ? LEFT HEART CATH AND CORONARY ANGIOGRAPHY N/A 09/13/2021  ? Procedure: LEFT HEART CATH AND CORONARY ANGIOGRAPHY;  Surgeon: Early Osmond, MD;  Location: [facility];  Service: Cardiovascular;  Laterality: N/A;  ? TONSILLECTOMY AND ADENOIDECTOMY    ? ?Social History  ? ?Social History  ? Marital Status: Widowed  ?  Spouse Name: N/A  ? Number of Children: 1  ? ?Occupational History  ?   ? ?Social History Main Topics  ? Smoking status: Never Smoker   ? Smokeless tobacco: Not on file  ? Alcohol Use: No  ? Drug Use: No  ? ?Current Outpatient Medications on File Prior to Visit  ?Medication Sig Dispense Refill  ? Ascorbic Acid (VITAMIN C) 1000 MG tablet Take 2,000 mg by mouth daily.    ? aspirin EC 81 MG tablet Take 81 mg by mouth daily. Swallow whole.    ? atorvastatin (LIPITOR) 40 MG tablet Take 1 tablet (40 mg total) by mouth daily. 90 tablet 3  ? B Complex-C (SUPER B COMPLEX PO) Take 2 capsules by mouth daily.    ? Blood Glucose Monitoring Suppl (ONE TOUCH ULTRA 2) w/Device KIT Use to check blood sugar 3 times daily 1 kit 0  ? Cholecalciferol (VITAMIN D3) 50 MCG (2000 UT) CAPS Take 8,000-10,000 Units by mouth daily. otc    ? clarithromycin (BIAXIN) 500 MG tablet Take 500 mg by mouth 2 (two) times daily.    ? clobetasol cream (TEMOVATE) 5.40 % Apply 1 application. topically 2 (two) times daily.    ? COLLAGEN PO Take 1 Scoop by mouth daily.    ? glucose blood (ONETOUCH ULTRA) test strip USE AS INSTRUCTED TO CHECK  SUGAR 3 TIMES DAILY 300 strip 4  ? hydrochlorothiazide (HYDRODIURIL) 25 MG tablet TAKE 1 TABLET BY MOUTH DAILY  (Patient not taking: Reported on 09/11/2021) 90 tablet 1  ? insulin glargine (LANTUS SOLOSTAR) 100 UNIT/ML Solostar Pen INJECT under skin 40 UNITS in AM and  20 UNITS in PM DAILY 30 mL 3  ? Insulin Pen Needle 32G X 4 MM MISC Use 4x a day 400 each 3  ? JARDIANCE 25 MG TABS tablet TAKE 1 TABLET BY MOUTH EVERY DAY BEFORE BREAKFAST 90 tablet  0  ? Lancets (ONETOUCH DELICA PLUS [id number]) MISC TEST 3 TIMES DAILY 300 each 1  ? losartan (COZAAR) 100 MG tablet Take 1 tablet (100 mg total) by mouth daily. 90 tablet 3  ? Magnesium 400 MG TABS Take 1 tablet by mouth at bedtime. 90 tablet 3  ? MELATONIN PO Take 3 mg by mouth at bedtime.    ? metoprolol succinate (TOPROL-XL) 25 MG 24 hr tablet Take 1 tablet (25 mg total) by mouth daily. 90 tablet 3  ? Multiple Vitamin (MULTIVITAMIN) tablet Take 1 tablet by mouth daily.    ? Probiotic Product (CULTURELLE PROBIOTICS PO) Take 1 capsule by mouth daily.    ? Semaglutide, 1 MG/DOSE, (OZEMPIC, 1 MG/DOSE,) 4 MG/3ML SOPN Inject 1 mg into the skin once a week. (Patient taking differently: Inject 1 mg into the skin every Monday.) 9 mL 3  ? Turmeric (QC TUMERIC COMPLEX PO) Take 3 capsules by mouth daily.    ? ?No current facility-administered medications on file prior to visit.  ?LMP  (LMP Unknown)  ?Allergies  ?Allergen Reactions  ? Humalog [Insulin Lispro] Other (See Comments)  ?  Paresthesia - hands  ? Latex Hives  ?  itching  ? ?Family History  ?Problem Relation Age of Onset  ? Hypertension Mother   ? Diabetes Mother   ? Thyroid disease Mother   ? Diabetes Father   ? Cancer Father   ?     Colon cancer  ? Hypertension Father   ? Diabetes Sister   ? ?PE: ? BP 128/82 (BP Location: Left Arm, Patient Position: Sitting, Cuff Size: Normal)   Pulse 77   Ht 5' 1.5" (1.562 m)   Wt 170 lb (77.1 kg)   LMP  (LMP Unknown)   SpO2 99%   BMI 31.60 kg/m?  ? ?Wt Readings from Last 3 Encounters:  ?09/27/21 170 lb (77.1 kg)  ?09/13/21 165 lb (74.8 kg)  ?09/02/21 172 lb 12.8 oz (78.4 kg)   ? ?Constitutional: overweight, in NAD ?Eyes: PERRLA, EOMI, no exophthalmos ?ENT: moist mucous membranes, no thyromegaly, no cervical lymphadenopathy ?Cardiovascular: RRR, No MRG ?Respiratory: CTA B ?Musculoskeletal: no deformi

## 2021-09-27 NOTE — Patient Instructions (Addendum)
Look at the following books: ?Dr. Alyssa Grove - Program for Reversing Diabetes ?Rip [employer] - The Engine 2 diet ?Dr. Karl Luke - Prevent and Reverse Heart Disease  ?Dr. Alden Benjamin - How Not to Die ?Narda Amber, RD - The Kick Diabetes Cookbook ? ?Please decrease: ?- Lantus 32 units in the morning  ? ?Continue: ?- Jardiance 25 mg before breakfast ?- Mounjaro 7.5 mg once a week ? ?Please return in 4 months with your sugar log. ?  ? ?

## 2021-11-08 ENCOUNTER — Other Ambulatory Visit: Payer: 59

## 2021-11-18 ENCOUNTER — Other Ambulatory Visit: Payer: 59

## 2021-11-18 DIAGNOSIS — Z79899 Other long term (current) drug therapy: Secondary | ICD-10-CM

## 2021-11-18 DIAGNOSIS — E1169 Type 2 diabetes mellitus with other specified complication: Secondary | ICD-10-CM

## 2021-11-18 LAB — LIPID PANEL
Chol/HDL Ratio: 2.2 ratio (ref 0.0–4.4)
Cholesterol, Total: 127 mg/dL (ref 100–199)
HDL: 57 mg/dL (ref >39)
LDL Chol Calc (NIH): 55 mg/dL (ref 0–99)
Triglycerides: 74 mg/dL (ref 0–149)
VLDL Cholesterol Cal: 15 mg/dL (ref 5–40)

## 2021-11-18 LAB — HEPATIC FUNCTION PANEL
ALT: 29 IU/L (ref 0–32)
AST: 28 IU/L (ref 0–40)
Albumin: 4.5 g/dL (ref 3.8–4.8)
Alkaline Phosphatase: 92 IU/L (ref 44–121)
Bilirubin Total: 0.3 mg/dL (ref 0.0–1.2)
Bilirubin, Direct: <0.1 mg/dL (ref 0.00–0.40)
Total Protein: 6.7 g/dL (ref 6.0–8.5)

## 2021-11-20 NOTE — Progress Notes (Signed)
Office Visit Note  Patient: Kristen [name]             Date of Birth: [birth year]           MRN: [id number]             PCP: Inda Coke, PA Referring: Inda Coke, PA Visit Date: 12/03/2021 Occupation: @GUAROCC @  Subjective:  New Patient (Initial Visit) (Abnormal labs)   History of Present Illness: Kristen [name] is a 71 y.o. female seen in consultation per request of her PCP.  According the patient in February 2023 she developed an upper respiratory tract infection with cough which lingered on for couple of weeks and then resolved by itself without treatment.  She states she tested herself for COVID-19 infection at home which was negative.  2 months later she developed a rash on her back and was seen by Dr. Nevada Crane.  She states Dr. [place] Crane told her that the rash was due to a recent bacterial infection.  He gave her a topical agent and also erythromycin for the treatment.  She states the rash gradually moved to her legs.  She states that the rash does not itch.  She stopped using the topical agents because she felt that it was making the rash worse.  She has been using CeraVe with salicylic acid which makes the rash red but its been drying up gradually.  She does not have rash on upper extremities or her chest.  There has been no rash on her face.  She denies any history of oral ulcers, nasal ulcers, sicca symptoms, malar rash, hair loss, Raynaud's phenomenon, lymphadenopathy or photosensitivity.  She was evaluated by her PCP in March at the time labs obtained showed positive ANA and she was referred to me for the evaluation of positive ANA.  There is no family history of autoimmune disease.  She is gravida 2, para 1, miscarriage 1.  There is no history of DVTs.  Activities of Daily Living:  Patient reports morning stiffness for 0  none .   Patient Denies nocturnal pain.  Difficulty dressing/grooming: Denies Difficulty climbing stairs: Denies Difficulty getting out of chair:  Denies Difficulty using hands for taps, buttons, cutlery, and/or writing: Denies  Review of Systems  Constitutional:  Negative for fatigue.  HENT:  Negative for mouth sores and mouth dryness.   Eyes:  Negative for dryness.  Respiratory:  Negative for shortness of breath.   Cardiovascular:  Positive for swelling in legs/feet.  Gastrointestinal:  Negative for constipation.  Endocrine: Negative for excessive thirst.  Genitourinary:  Negative for difficulty urinating.  Musculoskeletal:  Negative for morning stiffness.  Skin:  Positive for rash. Negative for color change and sensitivity to sunlight.  Allergic/Immunologic: Negative for susceptible to infections.  Neurological:  Negative for numbness.  Hematological:  Negative for bruising/bleeding tendency.  Psychiatric/Behavioral:  Negative for depressed mood and sleep disturbance. The patient is not nervous/anxious.     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, with long-term current use of insulin ([place]) 03/02/2019   Class 1 obesity 04/23/2017   Vitamin D deficiency 01/20/2016   Diabetic retinopathy ([place]) 02/03/2012   HTN (hypertension) 01/12/2012   Meralgia paresthetica     Past Medical History:  Diagnosis Date   Colon polyps  Diabetes mellitus    Glaucoma    History of chicken pox    HTN (hypertension)    Meralgia paresthetica     Family History  Problem Relation Age of Onset   Hypertension Mother    Diabetes Mother    Thyroid disease Mother    Diabetes Father    Cancer Father        Colon cancer   Hypertension Father    Diabetes Sister    Diabetes Sister    Diabetes Sister    Diabetes Sister    Past Surgical History:  Procedure Laterality Date   BREAST CYST EXCISION Left    patient states btw [phone]    CHOLECYSTECTOMY  06/16/2008   LEFT HEART CATH AND CORONARY ANGIOGRAPHY N/A 09/13/2021   Procedure: LEFT HEART CATH AND CORONARY ANGIOGRAPHY;  Surgeon: Orbie Pyo, MD;  Location: [facility];  Service: Cardiovascular;  Laterality: N/A;   TONSILLECTOMY AND ADENOIDECTOMY     Social History   Social History Narrative   Lives alone, though her son stays with her as needed.   4 adult children, 1 biological, 3 adopted.   Immunization History  Administered Date(s) Administered   PFIZER(Purple Top)SARS-COV-2 Vaccination 07/30/2019, 08/24/2019, 04/02/2020   Tdap 09/08/2012     Objective: Vital Signs: BP 119/77 (BP Location: Right Arm, Patient Position: Sitting, Cuff Size: Normal)   Pulse 65   Resp 16   Ht 5' 1.5" (1.562 m)   Wt 170 lb 9.6 oz (77.4 kg)   LMP  (LMP Unknown)   BMI 31.71 kg/m    Physical Exam Vitals and nursing note reviewed.  Constitutional:      Appearance: She is well-developed.  HENT:     Head: Normocephalic and atraumatic.  Eyes:     Conjunctiva/sclera: Conjunctivae normal.  Cardiovascular:     Rate and Rhythm: Normal rate and regular rhythm.     Heart sounds: Normal heart sounds.  Pulmonary:     Effort: Pulmonary effort is normal.     Breath sounds: Normal breath sounds.  Abdominal:     General: Bowel sounds are normal.     Palpations: Abdomen is soft.  Musculoskeletal:     Cervical back: Normal range of motion.  Lymphadenopathy:     Cervical: No cervical adenopathy.  Skin:    General: Skin is warm and dry.     Capillary Refill: Capillary refill takes less than 2 seconds.     Comments: Erythematous plaques noted on her back, gluteal region, upper extremities and lower extremities.  Nail pitting was noted in her right middle and ring finger.  Neurological:     Mental Status: She is alert and oriented to person, place, and time.  Psychiatric:        Behavior: Behavior normal.      Musculoskeletal Exam: C-spine thoracic and lumbar spine  were in good range of motion.  Shoulder joints, elbow joints, wrist joints, MCPs PIPs and DIPs with good range of motion.  She had PIP and DIP thickening with no synovitis.  Hip joints, knee joints, ankles, MTPs and PIPs with good range of motion.  She had bilateral hammertoes.  CDAI Exam: CDAI Score: -- Patient Global: --; Provider Global: -- Swollen: --; Tender: -- Joint Exam 12/03/2021   No joint exam has been documented for this visit   There is currently no information documented on the homunculus. Go to the Rheumatology activity and complete the homunculus joint exam.  Investigation: No additional findings.  Imaging: No results found.  Recent Labs: Lab Results  Component Value Date   WBC 6.0 09/06/2021   HGB 13.6 09/06/2021   PLT 185 09/06/2021   NA 138 09/06/2021   K 4.8 09/06/2021   CL 102 09/06/2021   CO2 24 09/06/2021   GLUCOSE 98 09/06/2021   BUN 19 09/06/2021   CREATININE 0.59 09/06/2021   BILITOT 0.3 11/18/2021   ALKPHOS 92 11/18/2021   AST 28 11/18/2021   ALT 29 11/18/2021   PROT 6.7 11/18/2021   ALBUMIN 4.5 11/18/2021   CALCIUM 9.2 09/06/2021   GFRAA 104 09/01/2017    Speciality Comments: No specialty comments available.  Procedures:  No procedures performed Allergies: Humalog [insulin lispro] and Latex   Assessment / Plan:     Visit Diagnoses: Positive ANA (antinuclear antibody) - 08/12/21: ANA 1:160 nuclear, dense fine speckled, ESR 13, CRP<1, RF 19 -she has positive ANA.  She developed a rash 2 months after upper respiratory tract infection.  There is no history of oral ulcers, nasal ulcers, malar rash, hair loss, sicca symptoms, Raynaud's phenomenon, lymphadenopathy or inflammatory arthritis.  There is no family history of autoimmune disease.  I will obtain additional labs to complete the work-up.  Plan: Urinalysis, Routine w reflex microscopic, ANA, Anti-scleroderma antibody, RNP Antibody, Anti-Smith antibody, Sjogrens syndrome-A extractable nuclear  antibody, Sjogrens syndrome-B extractable nuclear antibody, Anti-DNA antibody, double-stranded, C3 and C4, Beta-2 glycoprotein antibodies, Cardiolipin antibodies, IgG, IgM, IgA, Lupus Anticoagulant Eval w/Reflex  Skin rash -she has erythematous plaques on her trunk and upper and lower extremities.  Some of the lesions have pearly appearance.  She has been using topical agents.  The appearance of the rash raises the suspicion of psoriasis.  She has seen Dr. [place] Crane few months back.  Advised her to make a follow-up appointment for the evaluation.  She may need a skin biopsy to confirm the diagnosis.  It is not unusual to develop psoriasis after a strep infection.  There is no family history of psoriasis.  Plan: CK, HLA-B27 antigen  Primary osteoarthritis of both feet-she does not have any discomfort in her feet.  She had bilateral hammertoes.  Proper fitting shoes with arch support were advised.  Vitamin D deficiency-she takes vitamin D 2000 units daily.  Hypertension due to endocrine disorder-blood pressure was normal today.  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  Former smoker - 1/4 pack/day for 4 years.  She quit smoking in 1973.  Orders: Orders Placed This Encounter  Procedures   Urinalysis, Routine w reflex microscopic   CK   ANA   Anti-scleroderma antibody   RNP Antibody   Anti-Smith antibody   Sjogrens syndrome-A extractable nuclear antibody   Sjogrens syndrome-B extractable nuclear antibody   Anti-DNA antibody, double-stranded   C3 and C4   Beta-2 glycoprotein antibodies   Cardiolipin antibodies, IgG, IgM, IgA   Lupus Anticoagulant Eval w/Reflex   HLA-B27 antigen   No orders of the defined types were placed in this encounter.   Face-to-face time spent with patient was 45 minutes. Greater than 50% of time was spent in counseling and coordination of  care.  Follow-Up Instructions: Return for Rash, Positive ANA.   Bo Merino, MD  Note - This record has been created using Editor, commissioning.  Chart creation errors have been sought, but may not always  have been located. Such creation errors do not reflect on  the standard of medical care.

## 2021-12-02 ENCOUNTER — Encounter: Payer: Self-pay | Admitting: Physician Assistant

## 2021-12-02 ENCOUNTER — Ambulatory Visit (INDEPENDENT_AMBULATORY_CARE_PROVIDER_SITE_OTHER): Payer: 59 | Admitting: Physician Assistant

## 2021-12-02 VITALS — BP 140/78 | HR 75 | Temp 97.6°F | Ht 61.5 in | Wt 175.0 lb

## 2021-12-02 DIAGNOSIS — R1030 Lower abdominal pain, unspecified: Secondary | ICD-10-CM

## 2021-12-02 NOTE — Patient Instructions (Signed)
It was great to see you!  Continue the augmentin  I will be in touch with your urine culture results and if we need to change the plan  Abdominal Pain, Adult Many things can cause belly (abdominal) pain. Most times, belly pain is not dangerous. Many cases of belly pain can be watched and treated at home. Sometimes, though, belly pain is serious. Your doctor will try to find the cause of your belly pain. Follow these instructions at home:  Medicines Take over-the-counter and prescription medicines only as told by your doctor. Do not take medicines that help you poop (laxatives) unless told by your doctor. General instructions Watch your belly pain for any changes. Drink enough fluid to keep your pee (urine) pale yellow. Keep all follow-up visits as told by your doctor. This is important. Contact a doctor if: Your belly pain changes or gets worse. You are not hungry, or you lose weight without trying. You are having trouble pooping (constipated) or have watery poop (diarrhea) for more than 2-3 days. You have pain when you pee or poop. Your belly pain wakes you up at night. Your pain gets worse with meals, after eating, or with certain foods. You are vomiting and cannot keep anything down. You have a fever. You have blood in your pee. Get help right away if: Your pain does not go away as soon as your doctor says it should. You cannot stop vomiting. Your pain is only in areas of your belly, such as the right side or the left lower part of the belly. You have bloody or black poop, or poop that looks like tar. You have very bad pain, cramping, or bloating in your belly. You have signs of not having enough fluid or water in your body (dehydration), such as: Dark pee, very little pee, or no pee. Cracked lips. Dry mouth. Sunken eyes. Sleepiness. Weakness. You have trouble breathing or chest pain. Summary Many cases of belly pain can be watched and treated at home. Watch your belly  pain for any changes. Take over-the-counter and prescription medicines only as told by your doctor. Contact a doctor if your belly pain changes or gets worse. Get help right away if you have very bad pain, cramping, or bloating in your belly.   Take care,  Jarold Motto PA-C

## 2021-12-02 NOTE — Progress Notes (Signed)
Kristen [name] is a 71 y.o. female here for a new problem of abdominal pain.  History of Present Illness:   Chief Complaint  Patient presents with   side pain    Pt c/o pain left of abdomen on Friday 6/16 x 2 days, took Tylenol and then also started antibiotic Friday evening. Symptoms have resolved.    HPI  Abdominal Pain Patient complain of left abdominal pain that has been onset for 5 days. Located on lower abdomen. States her symptoms started Thursday night and this has woke her up from her sleep. She has tried taking Tylenol and Augmentin on Friday evening to help with the pain. Symptoms have resolved as of today. Has had loose stools this morning. She does have a hx of diverticulosis. Denies any other worsening symptoms. Denies nausea or vomiting, fever, chills, bloody stools.  Past Medical History:  Diagnosis Date   Colon polyps    Diabetes mellitus    Glaucoma    History of chicken pox    HTN (hypertension)    Meralgia paresthetica      Social History   Tobacco Use   Smoking status: Never   Smokeless tobacco: Never  Vaping Use   Vaping Use: Never used  Substance Use Topics   Alcohol use: No   Drug use: No    Past Surgical History:  Procedure Laterality Date   BREAST CYST EXCISION Left    patient states btw [phone]   CHOLECYSTECTOMY  06/16/2008   LEFT HEART CATH AND CORONARY ANGIOGRAPHY N/A 09/13/2021   Procedure: LEFT HEART CATH AND CORONARY ANGIOGRAPHY;  Surgeon: Early Osmond, MD;  Location: [facility];  Service: Cardiovascular;  Laterality: N/A;   TONSILLECTOMY AND ADENOIDECTOMY      Family History  Problem Relation Age of Onset   Hypertension Mother    Diabetes Mother    Thyroid disease Mother    Diabetes Father    Cancer Father        Colon cancer   Hypertension Father    Diabetes Sister     Allergies  Allergen Reactions   Humalog [Insulin Lispro] Other (See Comments)    Paresthesia - hands   Latex Hives    itching    Current  Medications:   Current Outpatient Medications:    amoxicillin (AMOXIL) 875 MG tablet, Take 875 mg by mouth 2 (two) times daily., Disp: , Rfl:    Ascorbic Acid (VITAMIN C) 1000 MG tablet, Take 2,000 mg by mouth daily., Disp: , Rfl:    aspirin EC 81 MG tablet, Take 81 mg by mouth daily. Swallow whole., Disp: , Rfl:    B Complex-C (SUPER B COMPLEX PO), Take 2 capsules by mouth daily., Disp: , Rfl:    Blood Glucose Monitoring Suppl (ONE TOUCH ULTRA 2) w/Device KIT, Use to check blood sugar 3 times daily, Disp: 1 kit, Rfl: 0   Cholecalciferol (VITAMIN D3) 50 MCG (2000 UT) CAPS, Take 15,000 Units by mouth daily. otc, Disp: , Rfl:    glucose blood (ONETOUCH ULTRA) test strip, USE AS INSTRUCTED TO CHECK  SUGAR 3 TIMES DAILY, Disp: 300 strip, Rfl: 4   hydrochlorothiazide (HYDRODIURIL) 25 MG tablet, TAKE 1 TABLET BY MOUTH DAILY, Disp: 90 tablet, Rfl: 1   insulin glargine (LANTUS SOLOSTAR) 100 UNIT/ML Solostar Pen, INJECT under skin 40 UNITS in AM and  20 UNITS in PM DAILY, Disp: 30 mL, Rfl: 3   Insulin Pen Needle 32G X 4 MM MISC, Use 4x a  day, Disp: 400 each, Rfl: 3   JARDIANCE 25 MG TABS tablet, TAKE 1 TABLET BY MOUTH EVERY DAY BEFORE BREAKFAST, Disp: 90 tablet, Rfl: 0   Lancets (ONETOUCH DELICA PLUS XMIWOE32Z) MISC, TEST 3 TIMES DAILY, Disp: 300 each, Rfl: 1   losartan (COZAAR) 100 MG tablet, TAKE 1 TABLET BY MOUTH DAILY, Disp: 90 tablet, Rfl: 1   magnesium oxide (MAG-OX) 400 (240 Mg) MG tablet, Take 1 tablet by mouth at bedtime., Disp: , Rfl:    MELATONIN PO, Take 3 mg by mouth at bedtime., Disp: , Rfl:    Multiple Vitamin (MULTIVITAMIN) tablet, Take 1 tablet by mouth daily., Disp: , Rfl:    Probiotic Product (CULTURELLE PROBIOTICS PO), Take 1 capsule by mouth daily., Disp: , Rfl:    atorvastatin (LIPITOR) 40 MG tablet, Take 1 tablet (40 mg total) by mouth daily. (Patient not taking: Reported on 12/02/2021), Disp: 90 tablet, Rfl: 3   metoprolol succinate (TOPROL-XL) 25 MG 24 hr tablet, Take 1 tablet (25  mg total) by mouth daily. (Patient not taking: Reported on 12/02/2021), Disp: 90 tablet, Rfl: 3   Review of Systems:   ROS Negative unless otherwise specified per HPI.   Vitals:   Vitals:   12/02/21 1416  BP: 140/78  Pulse: 75  Temp: 97.6 F (36.4 C)  TempSrc: Temporal  SpO2: 98%  Weight: 175 lb (79.4 kg)  Height: 5' 1.5" (1.562 m)     Body mass index is 32.53 kg/m.  Physical Exam:   Physical Exam Vitals and nursing note reviewed.  Constitutional:      General: She is not in acute distress.    Appearance: She is well-developed. She is not ill-appearing or toxic-appearing.  Cardiovascular:     Rate and Rhythm: Normal rate and regular rhythm.     Pulses: Normal pulses.     Heart sounds: Normal heart sounds, S1 normal and S2 normal.  Pulmonary:     Effort: Pulmonary effort is normal.     Breath sounds: Normal breath sounds.  Abdominal:     General: Abdomen is flat. Bowel sounds are normal.     Palpations: Abdomen is soft.     Tenderness: There is no abdominal tenderness. There is no right CVA tenderness, left CVA tenderness, guarding or rebound.  Skin:    General: Skin is warm and dry.  Neurological:     Mental Status: She is alert.     GCS: GCS eye subscore is 4. GCS verbal subscore is 5. GCS motor subscore is 6.  Psychiatric:        Speech: Speech normal.        Behavior: Behavior normal. Behavior is cooperative.     Assessment and Plan:   Lower abdominal pain No red flags Symptoms essentially resolved Possible recent diverticulitis flare -- will have her continue her augmentin as prescribed for a total of 10 days Also will update urine culture to ensure no significant untreated infection If new/worsening symptoms, recommend follow-up  I,Savera Zaman,acting as a scribe for [employer], PA.,have documented all relevant documentation on the behalf of Inda Coke, PA,as directed by  Inda Coke, PA while in the presence of Inda Coke, [place].    I, Inda Coke, [place], have reviewed all documentation for this visit. The documentation on 12/02/21 for the exam, diagnosis, procedures, and orders are all accurate and complete.   Inda Coke, PA-C

## 2021-12-03 ENCOUNTER — Ambulatory Visit: Payer: 59 | Admitting: Rheumatology

## 2021-12-03 ENCOUNTER — Encounter: Payer: Self-pay | Admitting: Rheumatology

## 2021-12-03 VITALS — BP 119/77 | HR 65 | Resp 16 | Ht 61.5 in | Wt 170.6 lb

## 2021-12-03 DIAGNOSIS — R21 Rash and other nonspecific skin eruption: Secondary | ICD-10-CM | POA: Diagnosis not present

## 2021-12-03 DIAGNOSIS — M19071 Primary osteoarthritis, right ankle and foot: Secondary | ICD-10-CM

## 2021-12-03 DIAGNOSIS — R768 Other specified abnormal immunological findings in serum: Secondary | ICD-10-CM | POA: Diagnosis not present

## 2021-12-03 DIAGNOSIS — E559 Vitamin D deficiency, unspecified: Secondary | ICD-10-CM

## 2021-12-03 DIAGNOSIS — Z794 Long term (current) use of insulin: Secondary | ICD-10-CM

## 2021-12-03 DIAGNOSIS — E113591 Type 2 diabetes mellitus with proliferative diabetic retinopathy without macular edema, right eye: Secondary | ICD-10-CM

## 2021-12-03 DIAGNOSIS — I152 Hypertension secondary to endocrine disorders: Secondary | ICD-10-CM

## 2021-12-03 DIAGNOSIS — Z87891 Personal history of nicotine dependence: Secondary | ICD-10-CM

## 2021-12-03 DIAGNOSIS — M19072 Primary osteoarthritis, left ankle and foot: Secondary | ICD-10-CM

## 2021-12-03 LAB — URINE CULTURE
MICRO NUMBER:: 13542503
Result:: NO GROWTH
SPECIMEN QUALITY:: ADEQUATE

## 2021-12-09 ENCOUNTER — Ambulatory Visit: Payer: 59 | Admitting: Internal Medicine

## 2021-12-09 ENCOUNTER — Telehealth: Payer: Self-pay | Admitting: Pharmacy Technician

## 2021-12-12 LAB — HLA-B27 ANTIGEN: HLA-B27 Antigen: NEGATIVE

## 2021-12-12 LAB — ANTI-NUCLEAR AB-TITER (ANA TITER): ANA Titer 1: 1:80 {titer} — ABNORMAL HIGH

## 2021-12-12 LAB — URINALYSIS, ROUTINE W REFLEX MICROSCOPIC
Bilirubin Urine: NEGATIVE
Hgb urine dipstick: NEGATIVE
Ketones, ur: NEGATIVE
Leukocytes,Ua: NEGATIVE
Nitrite: NEGATIVE
Protein, ur: NEGATIVE
Specific Gravity, Urine: 1.027 (ref 1.001–1.035)
pH: <=5 (ref 5.0–8.0)

## 2021-12-12 LAB — CARDIOLIPIN ANTIBODIES, IGG, IGM, IGA
Anticardiolipin IgA: <2 APL-U/mL (ref <20.0)
Anticardiolipin IgG: <2 GPL-U/mL (ref <20.0)
Anticardiolipin IgM: <2 MPL-U/mL (ref <20.0)

## 2021-12-12 LAB — CK: Total CK: 85 U/L (ref 29–143)

## 2021-12-12 LAB — ANTI-DNA ANTIBODY, DOUBLE-STRANDED: ds DNA Ab: <1 IU/mL

## 2021-12-12 LAB — C3 AND C4
C3 Complement: 158 mg/dL (ref 83–193)
C4 Complement: 35 mg/dL (ref 15–57)

## 2021-12-12 LAB — LUPUS ANTICOAGULANT EVAL W/ REFLEX
PTT-LA Screen: 38 s (ref <=40)
dRVVT: 39 s (ref <=45)

## 2021-12-12 LAB — ANA: Anti Nuclear Antibody (ANA): POSITIVE — AB

## 2021-12-12 LAB — ANTI-SCLERODERMA ANTIBODY: Scleroderma (Scl-70) (ENA) Antibody, IgG: <1 AI

## 2021-12-12 LAB — BETA-2 GLYCOPROTEIN ANTIBODIES
Beta-2 Glyco 1 IgA: <2 U/mL (ref <20.0)
Beta-2 Glyco 1 IgM: <2 U/mL (ref <20.0)
Beta-2 Glyco I IgG: <2 U/mL (ref <20.0)

## 2021-12-12 LAB — RNP ANTIBODY: Ribonucleic Protein(ENA) Antibody, IgG: <1 AI

## 2021-12-12 LAB — ANTI-SMITH ANTIBODY: ENA SM Ab Ser-aCnc: <1 AI

## 2021-12-12 LAB — SJOGRENS SYNDROME-A EXTRACTABLE NUCLEAR ANTIBODY: SSA (Ro) (ENA) Antibody, IgG: <1 AI

## 2021-12-12 LAB — SJOGRENS SYNDROME-B EXTRACTABLE NUCLEAR ANTIBODY: SSB (La) (ENA) Antibody, IgG: <1 AI

## 2021-12-12 NOTE — Progress Notes (Signed)
I will discuss results at the follow-up visit.

## 2021-12-24 ENCOUNTER — Ambulatory Visit: Payer: 59 | Admitting: Rheumatology

## 2021-12-31 NOTE — Progress Notes (Signed)
Office Visit Note  Patient: Kristen [name]             Date of Birth: 1951-01-29           MRN: [id number]             PCP: Inda Coke, PA Referring: Inda Coke, PA Visit Date: 01/10/2022 Occupation: _0 @  Subjective:  Positive ANA and rash  History of Present Illness: Kristen [name] is a 71 y.o. female with history of positive ANA, rash and joint pain.  She returns today for follow-up visit.  She was evaluated by Dr. Nevada Crane and had a skin biopsy which came positive for psoriasis.  She states that Biaxin helped to clear her rash.  She has been also using topical steroid cream which has been helpful.  She denies any history of oral ulcers, nasal ulcers, malar rash, photosensitivity, Raynaud's phenomenon or lymphadenopathy.  She continues to have some pain and stiffness in her hands.  None of the other joints are painful.  She denies any history of planter fasciitis, Achilles tendinitis, uveitis.  There is no history of tendinitis.  Activities of Daily Living:  Patient reports morning stiffness for 0 minutes.   Patient Denies nocturnal pain.  Difficulty dressing/grooming: Denies Difficulty climbing stairs: Denies Difficulty getting out of chair: Denies Difficulty using hands for taps, buttons, cutlery, and/or writing: Denies  Review of Systems  Constitutional:  Negative for fatigue.  HENT:  Negative for mouth sores and mouth dryness.   Eyes:  Negative for dryness.  Respiratory:  Negative for shortness of breath.   Cardiovascular:  Negative for chest pain and palpitations.  Gastrointestinal:  Negative for blood in stool, constipation and diarrhea.  Endocrine: Negative for increased urination.  Genitourinary:  Negative for involuntary urination.  Musculoskeletal:  Negative for joint pain, joint pain, joint swelling, myalgias, muscle weakness, morning stiffness, muscle tenderness and myalgias.  Skin:  Positive for rash. Negative for color change, hair loss and sensitivity  to sunlight.  Allergic/Immunologic: Negative for susceptible to infections.  Neurological:  Negative for dizziness and headaches.  Hematological:  Negative for swollen glands.  Psychiatric/Behavioral:  Negative for depressed mood and sleep disturbance. The patient is not nervous/anxious.     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, with long-term current use of insulin ([place]) 03/02/2019   Class 1 obesity 04/23/2017   Vitamin D deficiency 01/20/2016   Diabetic retinopathy ([place]) 02/03/2012   HTN (hypertension) 01/12/2012   Meralgia paresthetica     Past Medical History:  Diagnosis Date   Colon polyps    Diabetes mellitus    Glaucoma    History of chicken pox    HTN (hypertension)    Meralgia paresthetica     Family History  Problem Relation Age of Onset   Hypertension Mother    Diabetes Mother    Thyroid disease Mother    Diabetes Father    Cancer Father        Colon cancer   Hypertension Father    Diabetes Sister    Diabetes Sister    Diabetes Sister    Diabetes Sister    Past Surgical History:  Procedure Laterality Date   BREAST CYST EXCISION Left    patient states btw [phone]   CHOLECYSTECTOMY  06/16/2008  LEFT HEART CATH AND CORONARY ANGIOGRAPHY N/A 09/13/2021   Procedure: LEFT HEART CATH AND CORONARY ANGIOGRAPHY;  Surgeon: Early Osmond, MD;  Location: [facility];  Service: Cardiovascular;  Laterality: N/A;   TONSILLECTOMY AND ADENOIDECTOMY     Social History   Social History Narrative   Lives alone, though her son stays with her as needed.   4 adult children, 1 biological, 3 adopted.   Immunization History  Administered Date(s) Administered   PFIZER(Purple Top)SARS-COV-2 Vaccination 07/30/2019, 08/24/2019, 04/02/2020   Tdap 09/08/2012      Objective: Vital Signs: BP (!) 143/83 (BP Location: Left Arm, Patient Position: Sitting, Cuff Size: Normal)   Pulse (!) 58   Resp 14   Ht 5' 1.5" (1.562 m)   Wt 174 lb 6.4 oz (79.1 kg)   LMP  (LMP Unknown)   BMI 32.42 kg/m    Physical Exam Vitals and nursing note reviewed.  Constitutional:      Appearance: She is well-developed.  HENT:     Head: Normocephalic and atraumatic.  Eyes:     Conjunctiva/sclera: Conjunctivae normal.  Cardiovascular:     Rate and Rhythm: Normal rate and regular rhythm.     Heart sounds: Normal heart sounds.  Pulmonary:     Effort: Pulmonary effort is normal.     Breath sounds: Normal breath sounds.  Abdominal:     General: Bowel sounds are normal.     Palpations: Abdomen is soft.  Musculoskeletal:     Cervical back: Normal range of motion.  Lymphadenopathy:     Cervical: No cervical adenopathy.  Skin:    General: Skin is warm and dry.     Capillary Refill: Capillary refill takes less than 2 seconds.  Neurological:     Mental Status: She is alert and oriented to person, place, and time.  Psychiatric:        Behavior: Behavior normal.      Musculoskeletal Exam: Cervical, thoracic and lumbar spine were in good range of motion.  She had no SI joint tenderness.  Shoulder joints, elbow joints, wrist joints, MCPs PIPs and DIPs with good range of motion with no synovitis.  She had bilateral PIP and DIP thickening consistent with osteoarthritis.  Hip joints, knee joints, ankles, MTPs and PIPs with good range of motion with no synovitis.  She had bilateral hammertoes.  There was no evidence of Achilles tendinitis or planter fasciitis.  CDAI Exam: CDAI Score: -- Patient Global: --; Provider Global: -- Swollen: --; Tender: -- Joint Exam 01/10/2022   No joint exam has been documented for this visit   There is currently no information documented on the homunculus. Go to the Rheumatology activity and complete the homunculus joint  exam.  Investigation: No additional findings.  Imaging: No results found.  Recent Labs: Lab Results  Component Value Date   WBC 6.0 09/06/2021   HGB 13.6 09/06/2021   PLT 185 09/06/2021   NA 138 09/06/2021   K 4.8 09/06/2021   CL 102 09/06/2021   CO2 24 09/06/2021   GLUCOSE 98 09/06/2021   BUN 19 09/06/2021   CREATININE 0.59 09/06/2021   BILITOT 0.3 11/18/2021   ALKPHOS 92 11/18/2021   AST 28 11/18/2021   ALT 29 11/18/2021   PROT 6.7 11/18/2021   ALBUMIN 4.5 11/18/2021   CALCIUM 9.2 09/06/2021   GFRAA 104 09/01/2017        Speciality Comments: No specialty comments available.  Procedures:  No procedures performed Allergies: Humalog [insulin lispro]  and Latex   Assessment / Plan:     Visit Diagnoses: Positive ANA (antinuclear antibody) - ANA low titer positive, ENA negative, complements normal.  Patient had no clinical features of autoimmune disease.  Left findings were reviewed with the patient.  I advised her to contact me if she develops any new symptoms. December 03, 2021 UA 3+ glucose, CK 85, ANA 1: 80NS, ENA negative, C3-C4 normal, beta-2 GP 1 negative, anticardiolipin negative, lupus anticoagulant negative, HLA-B27 negative  Psoriasis -I reviewed the skin biopsy report which was consistent with psoriasis.  HLA-B27 negative.December 31, 2021 Derm path report shave biopsy from back showed classical features of psoriasis per Dr. Diona Foley.  She has been using topical agents and is practicing dietary modifications for now.  Primary osteoarthritis of both hands-she had bilateral PIP and DIP thickening.  No synovitis was noted.  Clinical and radiographic findings were consistent with osteoarthritis.  Joint protection muscle strengthening was discussed.  A handout on hand exercises was given.  Primary osteoarthritis of both feet-proper fitting shoes were advised.  Vitamin D deficiency - He is on vitamin D 2000 units daily.  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  She quit smoking in 1973.  Orders: No orders of the defined types were placed in this encounter.  No orders of the defined types were placed in this encounter.    Follow-Up Instructions: Return if symptoms worsen or fail to improve, for Osteoarthritis,Ps.   Bo Merino, MD  Note - This record has been created using Editor, commissioning.  Chart creation errors have been sought, but may not always  have been located. Such creation errors do not reflect on  the standard of medical care.

## 2022-01-10 ENCOUNTER — Ambulatory Visit: Payer: 59 | Admitting: Rheumatology

## 2022-01-10 ENCOUNTER — Encounter: Payer: Self-pay | Admitting: Rheumatology

## 2022-01-10 VITALS — BP 143/83 | HR 58 | Resp 14 | Ht 61.5 in | Wt 174.4 lb

## 2022-01-10 DIAGNOSIS — M19042 Primary osteoarthritis, left hand: Secondary | ICD-10-CM

## 2022-01-10 DIAGNOSIS — M19041 Primary osteoarthritis, right hand: Secondary | ICD-10-CM

## 2022-01-10 DIAGNOSIS — M19071 Primary osteoarthritis, right ankle and foot: Secondary | ICD-10-CM

## 2022-01-10 DIAGNOSIS — Z794 Long term (current) use of insulin: Secondary | ICD-10-CM

## 2022-01-10 DIAGNOSIS — R21 Rash and other nonspecific skin eruption: Secondary | ICD-10-CM

## 2022-01-10 DIAGNOSIS — L409 Psoriasis, unspecified: Secondary | ICD-10-CM | POA: Diagnosis not present

## 2022-01-10 DIAGNOSIS — I152 Hypertension secondary to endocrine disorders: Secondary | ICD-10-CM

## 2022-01-10 DIAGNOSIS — E559 Vitamin D deficiency, unspecified: Secondary | ICD-10-CM | POA: Diagnosis not present

## 2022-01-10 DIAGNOSIS — M19072 Primary osteoarthritis, left ankle and foot: Secondary | ICD-10-CM

## 2022-01-10 DIAGNOSIS — E113591 Type 2 diabetes mellitus with proliferative diabetic retinopathy without macular edema, right eye: Secondary | ICD-10-CM

## 2022-01-10 DIAGNOSIS — R768 Other specified abnormal immunological findings in serum: Secondary | ICD-10-CM

## 2022-01-10 DIAGNOSIS — Z87891 Personal history of nicotine dependence: Secondary | ICD-10-CM

## 2022-01-10 NOTE — Patient Instructions (Signed)
Hand Exercises Hand exercises can be helpful for almost anyone. These exercises can strengthen the hands, improve flexibility and movement, and increase blood flow to the hands. These results can make work and daily tasks easier. Hand exercises can be especially helpful for people who have joint pain from arthritis or have nerve damage from overuse (carpal tunnel syndrome). These exercises can also help people who have injured a hand. Exercises Most of these hand exercises are gentle stretching and motion exercises. It is usually safe to do them often throughout the day. Warming up your hands before exercise may help to reduce stiffness. You can do this with gentle massage or by placing your hands in warm water for 10-15 minutes. It is normal to feel some stretching, pulling, tightness, or mild discomfort as you begin new exercises. This will gradually improve. Stop an exercise right away if you feel sudden, severe pain or your pain gets worse. Ask your health care provider which exercises are best for you. Knuckle bend or "claw" fist  Stand or sit with your arm, hand, and all five fingers pointed straight up. Make sure to keep your wrist straight during the exercise. Gently bend your fingers down toward your palm until the tips of your fingers are touching the top of your palm. Keep your big knuckle straight and just bend the small knuckles in your fingers. Hold this position for __________ seconds. Straighten (extend) your fingers back to the starting position. Repeat this exercise 5-10 times with each hand. Full finger fist  Stand or sit with your arm, hand, and all five fingers pointed straight up. Make sure to keep your wrist straight during the exercise. Gently bend your fingers into your palm until the tips of your fingers are touching the middle of your palm. Hold this position for __________ seconds. Extend your fingers back to the starting position, stretching every joint fully. Repeat  this exercise 5-10 times with each hand. Straight fist Stand or sit with your arm, hand, and all five fingers pointed straight up. Make sure to keep your wrist straight during the exercise. Gently bend your fingers at the big knuckle, where your fingers meet your hand, and the middle knuckle. Keep the knuckle at the tips of your fingers straight and try to touch the bottom of your palm. Hold this position for __________ seconds. Extend your fingers back to the starting position, stretching every joint fully. Repeat this exercise 5-10 times with each hand. Tabletop  Stand or sit with your arm, hand, and all five fingers pointed straight up. Make sure to keep your wrist straight during the exercise. Gently bend your fingers at the big knuckle, where your fingers meet your hand, as far down as you can while keeping the small knuckles in your fingers straight. Think of forming a tabletop with your fingers. Hold this position for __________ seconds. Extend your fingers back to the starting position, stretching every joint fully. Repeat this exercise 5-10 times with each hand. Finger spread  Place your hand flat on a table with your palm facing down. Make sure your wrist stays straight as you do this exercise. Spread your fingers and thumb apart from each other as far as you can until you feel a gentle stretch. Hold this position for __________ seconds. Bring your fingers and thumb tight together again. Hold this position for __________ seconds. Repeat this exercise 5-10 times with each hand. Making circles  Stand or sit with your arm, hand, and all five fingers pointed   straight up. Make sure to keep your wrist straight during the exercise. Make a circle by touching the tip of your thumb to the tip of your index finger. Hold for __________ seconds. Then open your hand wide. Repeat this motion with your thumb and each finger on your hand. Repeat this exercise 5-10 times with each hand. Thumb  motion  Sit with your forearm resting on a table and your wrist straight. Your thumb should be facing up toward the ceiling. Keep your fingers relaxed as you move your thumb. Lift your thumb up as high as you can toward the ceiling. Hold for __________ seconds. Bend your thumb across your palm as far as you can, reaching the tip of your thumb for the small finger (pinkie) side of your palm. Hold for __________ seconds. Repeat this exercise 5-10 times with each hand. Grip strengthening  Hold a stress ball or other soft ball in the middle of your hand. Slowly increase the pressure, squeezing the ball as much as you can without causing pain. Think of bringing the tips of your fingers into the middle of your palm. All of your finger joints should bend when doing this exercise. Hold your squeeze for __________ seconds, then relax. Repeat this exercise 5-10 times with each hand. Contact a health care provider if: Your hand pain or discomfort gets much worse when you do an exercise. Your hand pain or discomfort does not improve within 2 hours after you exercise. If you have any of these problems, stop doing these exercises right away. Do not do them again unless your health care provider says that you can. Get help right away if: You develop sudden, severe hand pain or swelling. If this happens, stop doing these exercises right away. Do not do them again unless your health care provider says that you can. This information is not intended to replace advice given to you by your health care provider. Make sure you discuss any questions you have with your health care provider. Document Revised: 09/20/2020 Document Reviewed: 09/20/2020 Elsevier Patient Education  2023 [employer]  

## 2022-02-01 ENCOUNTER — Other Ambulatory Visit: Payer: Self-pay | Admitting: Internal Medicine

## 2022-02-07 ENCOUNTER — Ambulatory Visit: Payer: 59 | Admitting: Internal Medicine

## 2022-02-07 NOTE — Addendum Note (Signed)
Addended by: Kenyon Ana on: 02/07/2022 03:56 PM   Modules accepted: Orders

## 2022-02-07 NOTE — Progress Notes (Deleted)
Patient ID: Kristen [name], female   DOB: [birth year], 71 y.o.   MRN: [id number]  This visit occurred during the SARS-CoV-2 public health emergency.  Safety protocols were in place, including screening questions prior to the visit, additional usage of staff PPE, and extensive cleaning of exam room while observing appropriate contact time as indicated for disinfecting solutions.   HPI: Kristen [name] is a 71 y.o.-year-old female, returning for f/u for DM2, dx in 2000, insulin-dependent since 2012, uncontrolled, with complications (DR).  Last visit 4.5 months ago.  Interim history: No increased urination, blurry vision, nausea, chest pain. She was recently found to have cardiomyopathy with a lower ejection fraction: 45-50%. She had a heart cath 09/13/2021 >> no blockages.  She is planning to start a plant based diet.  She already started to eat more vegetables.  She saw an improvement in blood sugars and reduce her insulin.  Reviewed HbA1c levels: Lab Results  Component Value Date   HGBA1C 7.9 (A) 05/17/2021   HGBA1C 7.4 (H) 11/09/2020   HGBA1C 7.6 (A) 08/27/2020   HGBA1C 7.3 (A) 04/18/2020   HGBA1C 7.7 (H) 11/24/2019   HGBA1C 7.6 (H) 03/02/2019   HGBA1C 6.7 (A) 06/10/2018   HGBA1C 6.9 (A) 02/25/2018   HGBA1C 8.6 (A) 10/22/2017   HGBA1C 8.4 07/24/2017   HGBA1C 9.4 04/23/2017   HGBA1C 8.7 11/21/2016   HGBA1C 8.5 08/22/2016   HGBA1C 9.3 04/09/2016   HGBA1C 9.3 10/30/2015   HGBA1C 8.6 07/18/2015   HGBA1C 9.9 04/16/2015   HGBA1C 9.5 09/21/2014   HGBA1C 11.4 05/18/2014   HGBA1C 9.4 08/26/2013   She is on: - Lantus 40 units in the morning >> 40 units 2x a day >> 40 units in am and 20 at night >> 40 units in am - Jardiance 25 mg before breakfast >> restarted 11/2020 - Ozempic 1 mg weekly (did not start Mounjaro 7.5 mg weekly, although it was approved for her...) She stopped  Victoza b/c $$$. Trulicity not covered. She was on Jardiance 25 mg daily before b'fast. - added 03/2016 - stopped  06/2016  >> sugars increased >> at last visit, I advised her to restart >> did not do so as this was expensive. She tried Metformin ER 500 mg >> still N/D/AP. She was previously on Basaglar 45 units daily.  She was also previously on Humalog 20 to 25 units before lunch and dinner. She was previously on Ozempic -started 04/2020, then off, then restarted 11/2020.  She could not continue due to price.  Pt checks her sugars 3-4 times a day: - am: 109-148 >> 105-128, 175, 180 (missed insulin at night) >> 104-110, 158 - 2h after b'fast:  152  >> n/c >> 267 >> n/c - before lunch: 130-135 >> 130 (w/o Humalog) >> 180 >> n/c - 2h after lunch:  130-165, 180 >> 161-201 >> n/c - before dinner:  73, 110-173 >> n/c >> 125-132 >> n/c  - 2h after dinner: 280-295 >> n/c >> 175-187 >> 180-189, 250 - bedtime: 175-200 >> 173, 180 >> 160s >> n/c - nighttime: 65 >> n/c >> 240-285 >> n/c Lowest sugar was 90 >> 140 >> 105 >> 104; she has hypoglycemia awareness in the 70s. Highest sugar was 201 >> 285 >> 250.  Glucometer: AccuChek  She does intermittent fasting. Meals: - brunch: grits + meat + 2 eggs - +/- snack tuna, boiled egg - dinner: meat + 3 veggies  -No CKD, last BUN/creatinine:  Lab Results  Component Value  Date   BUN 19 09/06/2021   CREATININE 0.59 09/06/2021  On losartan.  -+ HL; last set of lipids: Lab Results  Component Value Date   CHOL 127 11/18/2021   HDL 57 11/18/2021   LDLCALC 55 11/18/2021   TRIG 74 11/18/2021   CHOLHDL 2.2 11/18/2021  Not on a statin.  She did not start Lipitor 40 mg daily as advised.  - last eye exam was in 06/2021: + DR - improved, "healed" reportedly, + glaucoma, + small cataract. Dr. Phineas Real ([place]  She also sees a retina specialist - [place].  -She denies numbness and tingling in her feet.  She also has GERD, HTN.  ROS: + see HPI  I reviewed pt's medications, allergies, PMH, social hx, family hx, and changes were documented in the history  of present illness. Otherwise, unchanged from my initial visit note.  Past Medical History:  Diagnosis Date   Colon polyps    Diabetes mellitus    Glaucoma    History of chicken pox    HTN (hypertension)    Meralgia paresthetica    Past Surgical History:  Procedure Laterality Date   BREAST CYST EXCISION Left    patient states btw [phone]   CHOLECYSTECTOMY  06/16/2008   LEFT HEART CATH AND CORONARY ANGIOGRAPHY N/A 09/13/2021   Procedure: LEFT HEART CATH AND CORONARY ANGIOGRAPHY;  Surgeon: Early Osmond, MD;  Location: [facility];  Service: Cardiovascular;  Laterality: N/A;   TONSILLECTOMY AND ADENOIDECTOMY     Social History   Social History   Marital Status: Widowed    Spouse Name: N/A   Number of Children: 1   Occupational History      Social History Main Topics   Smoking status: Never Smoker    Smokeless tobacco: Not on file   Alcohol Use: No   Drug Use: No   Current Outpatient Medications on File Prior to Visit  Medication Sig Dispense Refill   Ascorbic Acid (VITAMIN C) 1000 MG tablet Take 2,000 mg by mouth daily.     aspirin EC 81 MG tablet Take 81 mg by mouth daily. Swallow whole.     B Complex-C (SUPER B COMPLEX PO) Take 2 capsules by mouth daily.     Blood Glucose Monitoring Suppl (ONE TOUCH ULTRA 2) w/Device KIT Use to check blood sugar 3 times daily 1 kit 0   Cholecalciferol (VITAMIN D3) 50 MCG (2000 UT) CAPS Take 15,000 Units by mouth daily. otc     clarithromycin (BIAXIN) 500 MG tablet Take 500 mg by mouth 2 (two) times daily.     glucose blood (ONETOUCH ULTRA) test strip USE AS INSTRUCTED TO CHECK  SUGAR 3 TIMES DAILY 300 strip 4   hydrochlorothiazide (HYDRODIURIL) 25 MG tablet TAKE 1 TABLET BY MOUTH DAILY 90 tablet 1   Insulin Pen Needle 32G X 4 MM MISC Use 4x a day 400 each 3   JARDIANCE 25 MG TABS tablet TAKE 1 TABLET BY MOUTH EVERY DAY BEFORE BREAKFAST 90 tablet 0   KRILL OIL PO Take by mouth daily.     Lancets (ONETOUCH DELICA PLUS  [id number]) MISC TEST 3 TIMES DAILY 300 each 1   LANTUS SOLOSTAR 100 UNIT/ML Solostar Pen INJECT UNDER SKIN 40 UNITS IN AM AND 20 UNITS IN PM DAILY 30 mL 3   losartan (COZAAR) 100 MG tablet TAKE 1 TABLET BY MOUTH DAILY 90 tablet 1   magnesium oxide (MAG-OX) 400 (240 Mg) MG tablet Take 1 tablet by  mouth at bedtime.     MELATONIN PO Take 3 mg by mouth at bedtime.     Multiple Vitamin (MULTIVITAMIN) tablet Take 1 tablet by mouth daily.     Probiotic Product (CULTURELLE PROBIOTICS PO) Take 1 capsule by mouth daily.     No current facility-administered medications on file prior to visit.  LMP  (LMP Unknown)  Allergies  Allergen Reactions   Humalog [Insulin Lispro] Other (See Comments)    Paresthesia - hands   Latex Hives    itching   Family History  Problem Relation Age of Onset   Hypertension Mother    Diabetes Mother    Thyroid disease Mother    Diabetes Father    Cancer Father        Colon cancer   Hypertension Father    Diabetes Sister    Diabetes Sister    Diabetes Sister    Diabetes Sister    PE:  LMP  (LMP Unknown)   Wt Readings from Last 3 Encounters:  01/10/22 174 lb 6.4 oz (79.1 kg)  12/03/21 170 lb 9.6 oz (77.4 kg)  12/02/21 175 lb (79.4 kg)   Constitutional: overweight, in NAD Eyes: PERRLA, EOMI, no exophthalmos ENT: moist mucous membranes, no thyromegaly, no cervical lymphadenopathy Cardiovascular: RRR, No MRG Respiratory: CTA B Musculoskeletal: no deformities, strength intact in all 4 Skin: moist, warm, no rashes Neurological: + tremor with outstretched hands, DTR normal in all 4 Diabetic Foot Exam - Simple   No data filed    ASSESSMENT: 1. DM2, insulin-dependent, uncontrolled, with complications - DR  No family history of medullary thyroid cancer or personal history of pancreatitis.  2. Obesity class 1 BMI Classification: < 18.5 underweight  18.5-24.9 normal weight  25.0-29.9 overweight  30.0-34.9 class I obesity  35.0-39.9 class II obesity  ?  40.0 class III obesity   PLAN:  1. Patient with longstanding, uncontrolled, type 2 diabetes, on basal insulin, SGLT2 inhibitor and now also GLP-1/GIP receptor agonist.  At last visit she was not able to get NovoLog as this was not covered by her insurance.  Sugars were improved and we did not have to start mealtime insulin at that time.  She was previously on Ozempic but had to stop due to lack of coverage.  She did not switch to Mounjaro 7.5 mg weekly, for which preauthorization form was approved, and she tells me that she continues on Ozempic.  Last HbA1c was slightly higher, at 7.9% at last visit. -At today's visit, sugars are mostly at goal, only slightly higher than goal at night, after dinner.  For now, I did not suggest a change in regimen especially since she already cut down on animal products and is preparing to start a full plant-based diet for the next month.  Of note, she tolerates Ozempic well.  In the future, we may switch to [facility] if still needed. - I suggested to:  Patient Instructions  Please continue: - Lantus 40 units in the morning and 20 units at night - Jardiance 25 mg before breakfast - Ozempic 1 mg weekly  Please return in 4 months with your sugar log.  - we checked her HbA1c: 7.0% (improved) - advised to check sugars at different times of the day - 2x a day, rotating check times - advised for yearly eye exams >> she is UTD - foot exam performed today - return to clinic in 4 months     2. Obesity class 1 -We will continue Jardiance and Ozempic,  which should also help with weight loss.  In 05/2021, I recommended Darcel Bayley and this was approved by her insurance.  However, at today's visit she mentions that she is still on Ozempic and does not remember discussing about Mounjaro. -She was previously doing intermittent fasting but not now.  As of now, she prepares to start on a plant-based diet.  Given her references. -She lost 9 pounds before the last 2 visits combined.   Since last visit, she lost 1 pound  Philemon Kingdom, MD PhD [facility] Endocrinology

## 2022-03-10 ENCOUNTER — Other Ambulatory Visit: Payer: Self-pay | Admitting: Physician Assistant

## 2022-03-10 DIAGNOSIS — Z1231 Encounter for screening mammogram for malignant neoplasm of breast: Secondary | ICD-10-CM

## 2022-03-24 ENCOUNTER — Other Ambulatory Visit: Payer: Self-pay | Admitting: Internal Medicine

## 2022-03-24 DIAGNOSIS — E11319 Type 2 diabetes mellitus with unspecified diabetic retinopathy without macular edema: Secondary | ICD-10-CM

## 2022-04-01 ENCOUNTER — Encounter: Payer: Self-pay | Admitting: Internal Medicine

## 2022-04-01 ENCOUNTER — Ambulatory Visit (INDEPENDENT_AMBULATORY_CARE_PROVIDER_SITE_OTHER): Payer: 59 | Admitting: Internal Medicine

## 2022-04-01 VITALS — BP 130/88 | HR 61 | Ht 61.5 in | Wt 176.2 lb

## 2022-04-01 DIAGNOSIS — E669 Obesity, unspecified: Secondary | ICD-10-CM

## 2022-04-01 DIAGNOSIS — Z794 Long term (current) use of insulin: Secondary | ICD-10-CM | POA: Diagnosis not present

## 2022-04-01 DIAGNOSIS — E11319 Type 2 diabetes mellitus with unspecified diabetic retinopathy without macular edema: Secondary | ICD-10-CM | POA: Diagnosis not present

## 2022-04-01 LAB — POCT GLYCOSYLATED HEMOGLOBIN (HGB A1C): Hemoglobin A1C: 7.8 % — AB (ref 4.0–5.6)

## 2022-04-01 NOTE — Progress Notes (Signed)
Patient ID: Kristen [name], female   DOB: 06/05/1951, 71 y.o.   MRN: [id number]  HPI: Kristen [name] is a 71 y.o.-year-old female, returning for f/u for DM2, dx in 2000, insulin-dependent since 2012, uncontrolled, with complications (DR).  Last visit 6 months ago.  Interim history: No increased urination, blurry vision, nausea, chest pain. For last visit she was found to have cardiomyopathy with a lower ejection fraction: 45-50%. She had a heart cath 09/13/2021 >> no blockages.  She was planning to start a plant-based diet at last visit. She did not start >> adding green smoothies. 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  She feels that this happened after she got her COVID-vaccine summer.  However, she stopped some of her medicines after she developed the rash, including Ozempic.  Reviewed HbA1c levels: Lab Results  Component Value Date   HGBA1C 7.0 (A) 09/27/2021   HGBA1C 7.9 (A) 05/17/2021   HGBA1C 7.4 (H) 11/09/2020   HGBA1C 7.6 (A) 08/27/2020   HGBA1C 7.3 (A) 04/18/2020   HGBA1C 7.7 (H) 11/24/2019   HGBA1C 7.6 (H) 03/02/2019   HGBA1C 6.7 (A) 06/10/2018   HGBA1C 6.9 (A) 02/25/2018   HGBA1C 8.6 (A) 10/22/2017   HGBA1C 8.4 07/24/2017   HGBA1C 9.4 04/23/2017   HGBA1C 8.7 11/21/2016   HGBA1C 8.5 08/22/2016   HGBA1C 9.3 04/09/2016   HGBA1C 9.3 10/30/2015   HGBA1C 8.6 07/18/2015   HGBA1C 9.9 04/16/2015   HGBA1C 9.5 09/21/2014   HGBA1C 11.4 05/18/2014   She is on: - Lantus  40 units 2x a day >> 40 units in am and 20 at night >> 40 units in am and 20 units at night >> 50 units in am - Jardiance 25 mg before breakfast >> restarted 11/2020 - Ozempic 1 mg weekly (did not start Mounjaro 7.5 mg weekly, although it was approved for her...) >> off now 2/2 plaque psoriasis few mo ago She stopped  Victoza b/c $$$. Trulicity not covered. She was on Jardiance 25 mg daily before b'fast. - added 03/2016 - stopped 06/2016  >> sugars increased >> at last visit, I advised her to  restart >> did not do so as this was expensive. She tried Metformin ER 500 mg >> still N/D/AP. She was previously on Basaglar 45 units daily.  She was also previously on Humalog 20 to 25 units before lunch and dinner. She was previously on Ozempic -started 04/2020, then off, then restarted 11/2020.  She could not continue due to price.  Pt checks her sugars 3-4 times a day: - am: 109-148 >> 105-128, 175, 180  >> 104-110, 158 >> 95-110 - 2h after b'fast:  152  >> n/c >> 267 >> n/c - before lunch: 130-135 >> 130 (w/o Humalog) >> 180 >> n/c - 2h after lunch:  130-165, 180 >> 161-201 >> n/c - before dinner:  73, 110-173 >> n/c >> 125-132 >> n/c  - 2h after dinner: 280-295 >> n/c >> 175-187 >> 180-189, 250 >> 180-230 - bedtime: 175-200 >> 173, 180 >> 160s >> n/c - nighttime: 65 >> n/c >> 240-285 >> n/c >> 69 Lowest sugar was 90 >> 140 >> 105 >> 104 >> 69; she has hypoglycemia awareness in the 70s. Highest sugar was 201 >> 285 >> 250 >> 230.  Glucometer: AccuChek  She does intermittent fasting. Meals: - brunch: grits + meat + 2 eggs - +/- snack tuna, boiled egg - dinner: meat + 3 veggies  -No CKD, last BUN/creatinine:  Lab Results  Component Value Date   BUN 19 09/06/2021   CREATININE 0.59 09/06/2021  On losartan.  -+ HL; last set of lipids: Lab Results  Component Value Date   CHOL 127 11/18/2021   HDL 57 11/18/2021   LDLCALC 55 11/18/2021   TRIG 74 11/18/2021   CHOLHDL 2.2 11/18/2021  Not on a statin.  She did not start Lipitor 40 mg daily as advised.  - last eye exam was in 06/2021: + DR - improved, "healed" reportedly, + glaucoma, + small cataract. Dr. Phineas Real ([place]  She also sees a retina specialist - [place].  -She denies numbness and tingling in her feet.  Last foot exam 09/2021.  She also has GERD, HTN.  ROS: + see HPI  I reviewed pt's medications, allergies, PMH, social hx, family hx, and changes were documented in the history of present illness.  Otherwise, unchanged from my initial visit note.  Past Medical History:  Diagnosis Date   Colon polyps    Diabetes mellitus    Glaucoma    History of chicken pox    HTN (hypertension)    Meralgia paresthetica    Past Surgical History:  Procedure Laterality Date   BREAST CYST EXCISION Left    patient states btw [phone]   CHOLECYSTECTOMY  06/16/2008   LEFT HEART CATH AND CORONARY ANGIOGRAPHY N/A 09/13/2021   Procedure: LEFT HEART CATH AND CORONARY ANGIOGRAPHY;  Surgeon: Early Osmond, MD;  Location: [facility];  Service: Cardiovascular;  Laterality: N/A;   TONSILLECTOMY AND ADENOIDECTOMY     Social History   Social History   Marital Status: Widowed    Spouse Name: N/A   Number of Children: 1   Occupational History      Social History Main Topics   Smoking status: Never Smoker    Smokeless tobacco: Not on file   Alcohol Use: No   Drug Use: No   Current Outpatient Medications on File Prior to Visit  Medication Sig Dispense Refill   Ascorbic Acid (VITAMIN C) 1000 MG tablet Take 2,000 mg by mouth daily.     aspirin EC 81 MG tablet Take 81 mg by mouth daily. Swallow whole.     B Complex-C (SUPER B COMPLEX PO) Take 2 capsules by mouth daily.     Blood Glucose Monitoring Suppl (ONE TOUCH ULTRA 2) w/Device KIT Use to check blood sugar 3 times daily 1 kit 0   Cholecalciferol (VITAMIN D3) 50 MCG (2000 UT) CAPS Take 15,000 Units by mouth daily. otc     clarithromycin (BIAXIN) 500 MG tablet Take 500 mg by mouth 2 (two) times daily.     glucose blood (ONETOUCH ULTRA) test strip USE AS INSTRUCTED TO CHECK  SUGAR 3 TIMES DAILY 300 strip 4   hydrochlorothiazide (HYDRODIURIL) 25 MG tablet TAKE 1 TABLET BY MOUTH DAILY 90 tablet 1   Insulin Pen Needle 32G X 4 MM MISC Use 4x a day 400 each 3   JARDIANCE 25 MG TABS tablet TAKE 1 TABLET BY MOUTH EVERY DAY BEFORE BREAKFAST 30 tablet 0   KRILL OIL PO Take by mouth daily.     Lancets (ONETOUCH DELICA PLUS [id number]) MISC TEST 3  TIMES DAILY 300 each 1   LANTUS SOLOSTAR 100 UNIT/ML Solostar Pen INJECT UNDER SKIN 40 UNITS IN AM AND 20 UNITS IN PM DAILY 30 mL 3   losartan (COZAAR) 100 MG tablet TAKE 1 TABLET BY MOUTH DAILY 90 tablet 1   magnesium oxide (  MAG-OX) 400 (240 Mg) MG tablet Take 1 tablet by mouth at bedtime.     MELATONIN PO Take 3 mg by mouth at bedtime.     Multiple Vitamin (MULTIVITAMIN) tablet Take 1 tablet by mouth daily.     Probiotic Product (CULTURELLE PROBIOTICS PO) Take 1 capsule by mouth daily.     No current facility-administered medications on file prior to visit.  LMP  (LMP Unknown)  Allergies  Allergen Reactions   Humalog [Insulin Lispro] Other (See Comments)    Paresthesia - hands   Latex Hives    itching   Family History  Problem Relation Age of Onset   Hypertension Mother    Diabetes Mother    Thyroid disease Mother    Diabetes Father    Cancer Father        Colon cancer   Hypertension Father    Diabetes Sister    Diabetes Sister    Diabetes Sister    Diabetes Sister    PE:  BP 130/88 (BP Location: Left Arm, Patient Position: Sitting, Cuff Size: Normal)   Pulse 61   Ht 5' 1.5" (1.562 m)   Wt 176 lb 3.2 oz (79.9 kg)   LMP  (LMP Unknown)   SpO2 98%   BMI 32.75 kg/m   Wt Readings from Last 3 Encounters:  04/01/22 176 lb 3.2 oz (79.9 kg)  01/10/22 174 lb 6.4 oz (79.1 kg)  12/03/21 170 lb 9.6 oz (77.4 kg)   Constitutional: overweight, in NAD Eyes: EOMI, no exophthalmos ENT: no thyromegaly, no cervical lymphadenopathy Cardiovascular: RRR, No MRG Respiratory: CTA B Musculoskeletal: no deformities Skin: + Macular silver rash on bilateral legs Neurological: + tremor with outstretched hands  ASSESSMENT: 1. DM2, insulin-dependent, uncontrolled, with complications - DR  No family history of medullary thyroid cancer or personal history of pancreatitis.  2. Obesity class 1 BMI Classification: < 18.5 underweight  18.5-24.9 normal weight  25.0-29.9 overweight   30.0-34.9 class I obesity  35.0-39.9 class II obesity  ? 40.0 class III obesity   PLAN:  1. Patient with longstanding, uncontrolled, type 2 diabetes, on basal insulin, SGLT2 inhibitor and previously GLP-1 receptor agonist, with improved control.  At last visit, HbA1c was 7.0%, decreased from 7.9%.  At that time, sugars were mostly at goal, only slightly higher than goal at night, after dinner.  We did not change her regimen at that time and she was preparing to start a plant-based diet. -At today's visit, despite coming off Ozempic, sugars are at goal in the morning despite stopping her Lantus dose at night.  However, sugars after dinner are higher than target.  She also describes occasional low blood sugars during the night, in the 60s.  I advised her to reduce the Lantus in the morning even more.  We will continue Jardiance for now.  However, we need more coverage of her meals and at this visit she agrees to restart Ozempic since she did not feel that her rash was actually related to the medication.  We will start at the lower dose, 0.5 mg weekly.  She still has the 1 mg pens at home and she will use this pen for now. - I suggested to:  Patient Instructions  Please decrease: - Lantus 40 units in the morning   Continue: - Jardiance 25 mg before breakfast  Restart: - Ozempic 0.5 mg weekly (36 clicks)  Please return in 4 months with your sugar log.  - we checked her HbA1c: 7.8% (higher) -  advised to check sugars at different times of the day - 2x a day, rotating check times - advised for yearly eye exams >> she is UTD - return to clinic in 4 months    2. Obesity class 1 - In 05/2021, I recommended Mounjaro and this was approved by her insurance.  -She was previously doing intermittent fasting but not now.  Last visit, however, she was preparing to start a plant-based diet.  Given her references.  However, since last visit, she did not start the plant-based diet but introduced green  smoothies.  She sometimes uses these instead of meals, but mostly adds down to the meals. -She lost a total of 10 pounds before the last 3 visits combined (1 pound before last visit). -She gained 6 lbs since last OV after coming off Ozempic >> we will restart Ozempic for now, and continue Jardiance.  Both should help with weight loss  Philemon Kingdom, MD PhD [facility] Endocrinology

## 2022-04-01 NOTE — Patient Instructions (Addendum)
Please decrease: - Lantus 40 units in the morning   Continue: - Jardiance 25 mg before breakfast  Restart: - Ozempic 0.5 mg weekly (36 clicks)  Please return in 4 months with your sugar log.

## 2022-04-07 ENCOUNTER — Ambulatory Visit
Admission: RE | Admit: 2022-04-07 | Discharge: 2022-04-07 | Disposition: A | Discharge status: Home / Self Care | Payer: 59 | Source: Ambulatory Visit | Attending: Physician Assistant | Admitting: Physician Assistant

## 2022-04-07 DIAGNOSIS — Z1231 Encounter for screening mammogram for malignant neoplasm of breast: Secondary | ICD-10-CM

## 2022-04-14 ENCOUNTER — Telehealth: Payer: Self-pay

## 2022-04-14 ENCOUNTER — Other Ambulatory Visit: Payer: Self-pay | Admitting: Internal Medicine

## 2022-04-14 MED ORDER — TIRZEPATIDE 7.5 MG/0.5ML ~~LOC~~ SOAJ: 7.5000 mg | SUBCUTANEOUS | 3 refills | Status: DC

## 2022-04-14 NOTE — Telephone Encounter (Signed)
Patient states that she would like to try Mounjaro instead of Ozempic. And would like to know if it can be sent in to her pharmacy to see if her insurance will cover it.

## 2022-04-15 NOTE — Telephone Encounter (Signed)
Patient informed that RX has been sent in

## 2022-04-30 ENCOUNTER — Other Ambulatory Visit (HOSPITAL_COMMUNITY): Payer: Self-pay

## 2022-05-01 ENCOUNTER — Other Ambulatory Visit (HOSPITAL_COMMUNITY): Payer: Self-pay

## 2022-05-01 ENCOUNTER — Telehealth: Payer: Self-pay

## 2022-05-01 NOTE — Telephone Encounter (Signed)
PA renewal initiated automatically by CoverMyMeds.  Submitted a Prior Authorization request to [facility] for Mounjaro 7.5MG /0.5ML pen-injectors via CoverMyMeds. Will update once we receive a response.   Key: Bennie Pierini

## 2022-05-02 NOTE — Telephone Encounter (Signed)
Patient Advocate Encounter  Prior Authorization for Mounjaro 7.5MG /0.5ML pen-injectors has been approved.    PA# [id number]  Key: [id number]  Effective dates: 05/02/2022 through 05/02/2023

## 2022-05-13 ENCOUNTER — Other Ambulatory Visit: Payer: Self-pay

## 2022-05-13 DIAGNOSIS — E11319 Type 2 diabetes mellitus with unspecified diabetic retinopathy without macular edema: Secondary | ICD-10-CM

## 2022-05-13 MED ORDER — EMPAGLIFLOZIN 25 MG PO TABS: 25.0000 mg | ORAL_TABLET | Freq: Every day | ORAL | 1 refills | Status: DC

## 2022-05-18 ENCOUNTER — Other Ambulatory Visit: Payer: Self-pay | Admitting: Physician Assistant

## 2022-08-06 ENCOUNTER — Other Ambulatory Visit: Payer: Self-pay | Admitting: Internal Medicine

## 2022-08-08 ENCOUNTER — Other Ambulatory Visit: Payer: Self-pay | Admitting: Internal Medicine

## 2022-08-08 ENCOUNTER — Other Ambulatory Visit: Payer: Self-pay | Admitting: Physician Assistant

## 2022-08-08 DIAGNOSIS — E11319 Type 2 diabetes mellitus with unspecified diabetic retinopathy without macular edema: Secondary | ICD-10-CM

## 2022-08-11 ENCOUNTER — Encounter: Payer: Self-pay | Admitting: Internal Medicine

## 2022-08-11 ENCOUNTER — Ambulatory Visit: Payer: 59 | Admitting: Internal Medicine

## 2022-08-11 VITALS — BP 130/80 | HR 62 | Ht 61.5 in | Wt 163.4 lb

## 2022-08-11 DIAGNOSIS — E669 Obesity, unspecified: Secondary | ICD-10-CM

## 2022-08-11 DIAGNOSIS — E11319 Type 2 diabetes mellitus with unspecified diabetic retinopathy without macular edema: Secondary | ICD-10-CM | POA: Diagnosis not present

## 2022-08-11 DIAGNOSIS — Z794 Long term (current) use of insulin: Secondary | ICD-10-CM | POA: Diagnosis not present

## 2022-08-11 LAB — POCT GLYCOSYLATED HEMOGLOBIN (HGB A1C): Hemoglobin A1C: 6.2 % — AB (ref 4.0–5.6)

## 2022-08-11 MED ORDER — LOSARTAN POTASSIUM 100 MG PO TABS: 100.0000 mg | ORAL_TABLET | Freq: Every day | ORAL | 3 refills | Status: DC

## 2022-08-11 MED ORDER — MOUNJARO 7.5 MG/0.5ML ~~LOC~~ SOAJ: SUBCUTANEOUS | 3 refills | Status: DC

## 2022-08-11 MED ORDER — LANTUS SOLOSTAR 100 UNIT/ML ~~LOC~~ SOPN: 28.0000 [IU] | PEN_INJECTOR | Freq: Every day | SUBCUTANEOUS | 3 refills | Status: DC

## 2022-08-11 NOTE — Progress Notes (Signed)
Patient ID: Kristen [name], female   DOB: 1950-08-31, 72 y.o.   MRN: [id number]  Kristen: BHARTI [name] is a 72 y.o.-year-old female, returning for f/u for DM2, dx in 2000, insulin-dependent since 2012, uncontrolled, with complications (DR).  Last visit 4 months ago.  Interim history: No increased urination, blurry vision, nausea, chest pain. She has a little diarrhea. Rash on her legs healed. She feels Turmeric helps.  Reviewed HbA1c levels: Lab Results  Component Value Date   HGBA1C 7.8 (A) 04/01/2022   HGBA1C 7.0 (A) 09/27/2021   HGBA1C 7.9 (A) 05/17/2021   HGBA1C 7.4 (H) 11/09/2020   HGBA1C 7.6 (A) 08/27/2020   HGBA1C 7.3 (A) 04/18/2020   HGBA1C 7.7 (H) 11/24/2019   HGBA1C 7.6 (H) 03/02/2019   HGBA1C 6.7 (A) 06/10/2018   HGBA1C 6.9 (A) 02/25/2018   HGBA1C 8.6 (A) 10/22/2017   HGBA1C 8.4 07/24/2017   HGBA1C 9.4 04/23/2017   HGBA1C 8.7 11/21/2016   HGBA1C 8.5 08/22/2016   HGBA1C 9.3 04/09/2016   HGBA1C 9.3 10/30/2015   HGBA1C 8.6 07/18/2015   HGBA1C 9.9 04/16/2015   HGBA1C 9.5 09/21/2014   She is on: - Lantus  40 units 2x a day >> 40 units in am and 20 at night >> 40 units in am and 20 units at night >> 50 >> 40 units in am >> 30-(40 )units in am - Jardiance 25 mg before breakfast >> restarted 11/2020 - Ozempic 1 mg weekly >> off >> Mounjaro 7.5 mg weekly She stopped  Victoza b/c $$$. Trulicity not covered. She was on Jardiance 25 mg daily before b'fast. - added 03/2016 - stopped 06/2016  >> sugars increased >> at last visit, I advised her to restart >> did not do so as this was expensive. She tried Metformin ER 500 mg >> still N/D/AP. She was previously on Basaglar 45 units daily.  She was also previously on Humalog 20 to 25 units before lunch and dinner. She was previously on Ozempic -started 04/2020, then off, then restarted 11/2020.  She could not continue due to price.  Pt checks her sugars 3-4 times a day: - am: 109-148 >> 105-128, 175, 180  >> 104-110, 158 >> 95-110 >>  96-111 - 2h after b'fast:  152  >> n/c >> 267 >> n/c - before lunch: 130-135 >> 130 (w/o Humalog) >> 180 >> n/c - 2h after lunch:  130-165, 180 >> 161-201 >> n/c >> 140-160 - before dinner:  73, 110-173 >> n/c >> 125-132 >> n/c  - 2h after dinner: 175-187 >> 180-189, 250 >> 180-230 >> 160-190, 240 - bedtime: 175-200 >> 173, 180 >> 160s >> n/c - nighttime: 65 >> n/c >> 240-285 >> n/c >> 69 >> 78 Lowest sugar was 90 >> 140 >> 105 >> 104 >> 69 >> 78; she has hypoglycemia awareness in the 70s. Highest sugar was 201 >> 285 >> 250 >> 230 >> 240.  Glucometer: AccuChek  She does intermittent fasting. Meals: - brunch: grits + meat + 2 eggs - +/- snack tuna, boiled egg - dinner: meat + 3 veggies  -No CKD, last BUN/creatinine:  Lab Results  Component Value Date   BUN 19 09/06/2021   CREATININE 0.59 09/06/2021  On losartan.  -+ HL; last set of lipids: Lab Results  Component Value Date   CHOL 127 11/18/2021   HDL 57 11/18/2021   LDLCALC 55 11/18/2021   TRIG 74 11/18/2021   CHOLHDL 2.2 11/18/2021  Not on a statin.  She  did not start Lipitor 40 mg daily as advised.  - last eye exam was in 05/2022: "healed" DR reportedly, + glaucoma, + small cataract. Dr. Phineas Real ([place]  She also sees a retina specialist - Dr. Iona Hansen.  -She denies numbness and tingling in her feet.  Last foot exam 09/2021.  She also has GERD, HTN. She has cardiomyopathy with a lower ejection fraction: 45-50%. She had a heart cath 09/13/2021 >> no blockages.   ROS: + see Kristen  I reviewed pt's medications, allergies, PMH, social hx, family hx, and changes were documented in the history of present illness. Otherwise, unchanged from my initial visit note.  Past Medical History:  Diagnosis Date   Colon polyps    Diabetes mellitus    Glaucoma    History of chicken pox    HTN (hypertension)    Meralgia paresthetica    Past Surgical History:  Procedure Laterality Date   BREAST CYST EXCISION Left     patient states btw [id number]   CHOLECYSTECTOMY  06/16/2008   LEFT HEART CATH AND CORONARY ANGIOGRAPHY N/A 09/13/2021   Procedure: LEFT HEART CATH AND CORONARY ANGIOGRAPHY;  Surgeon: Early Osmond, MD;  Location: [place] [facility];  Service: Cardiovascular;  Laterality: N/A;   TONSILLECTOMY AND ADENOIDECTOMY     Social History   Social History   Marital Status: Widowed    Spouse Name: N/A   Number of Children: 1   Occupational History      Social History Main Topics   Smoking status: Never Smoker    Smokeless tobacco: Not on file   Alcohol Use: No   Drug Use: No   Current Outpatient Medications on File Prior to Visit  Medication Sig Dispense Refill   Ascorbic Acid (VITAMIN C) 1000 MG tablet Take 2,000 mg by mouth daily.     aspirin EC 81 MG tablet Take 81 mg by mouth daily. Swallow whole.     B Complex-C (SUPER B COMPLEX PO) Take 2 capsules by mouth daily.     Blood Glucose Monitoring Suppl (ONE TOUCH ULTRA 2) w/Device KIT Use to check blood sugar 3 times daily 1 kit 0   Cholecalciferol (VITAMIN D3) 50 MCG (2000 UT) CAPS Take 15,000 Units by mouth daily. otc     clarithromycin (BIAXIN) 500 MG tablet Take 500 mg by mouth 2 (two) times daily.     glucose blood (ONETOUCH ULTRA) test strip USE AS INSTRUCTED TO CHECK  SUGAR 3 TIMES DAILY 300 strip 4   hydrochlorothiazide (HYDRODIURIL) 25 MG tablet TAKE 1 TABLET BY MOUTH DAILY 90 tablet 1   Insulin Pen Needle 32G X 4 MM MISC Use 4x a day 400 each 3   JARDIANCE 25 MG TABS tablet TAKE 1 TABLET BY MOUTH EVERY DAY BEFORE BREAKFAST 90 tablet 2   KRILL OIL PO Take by mouth daily.     Lancets (ONETOUCH DELICA PLUS 123XX123) MISC TEST 3 TIMES DAILY 300 each 1   LANTUS SOLOSTAR 100 UNIT/ML Solostar Pen INJECT UNDER SKIN 40 UNITS IN AM AND 20 UNITS IN PM DAILY 30 mL 3   losartan (COZAAR) 100 MG tablet TAKE 1 TABLET BY MOUTH EVERY DAY 90 tablet 0   magnesium oxide (MAG-OX) 400 (240 Mg) MG tablet Take 1 tablet by mouth at bedtime.      MELATONIN PO Take 3 mg by mouth at bedtime.     Multiple Vitamin (MULTIVITAMIN) tablet Take 1 tablet by mouth daily.     Probiotic Product (  CULTURELLE PROBIOTICS PO) Take 1 capsule by mouth daily.     tirzepatide (MOUNJARO) 7.5 MG/0.5ML Pen INJECT 7.5 MG SUBCUTANEOUSLY WEEKLY 2 mL 3   No current facility-administered medications on file prior to visit.  LMP  (LMP Unknown)  Allergies  Allergen Reactions   Humalog [Insulin Lispro] Other (See Comments)    Paresthesia - hands   Latex Hives    itching   Family History  Problem Relation Age of Onset   Hypertension Mother    Diabetes Mother    Thyroid disease Mother    Diabetes Father    Cancer Father        Colon cancer   Hypertension Father    Diabetes Sister    Diabetes Sister    Diabetes Sister    Diabetes Sister    PE:  BP 130/80 (BP Location: Right Arm, Patient Position: Sitting, Cuff Size: Normal)   Pulse 62   Ht 5' 1.5" (1.562 m)   Wt 163 lb 6.4 oz (74.1 kg)   LMP  (LMP Unknown)   SpO2 99%   BMI 30.37 kg/m   Wt Readings from Last 3 Encounters:  08/11/22 163 lb 6.4 oz (74.1 kg)  04/01/22 176 lb 3.2 oz (79.9 kg)  01/10/22 174 lb 6.4 oz (79.1 kg)   Constitutional: overweight, in NAD Eyes: EOMI, no exophthalmos ENT: no thyromegaly, no cervical lymphadenopathy Cardiovascular: RRR, No MRG, + B  LE mildly pitting edema lE pitting edema - mild Respiratory: CTA B Musculoskeletal: no deformities Skin: + Macular darkened discoloration on bilateral legs Neurological: + tremor with outstretched hands Diabetic Foot Exam - Simple   Simple Foot Form Diabetic Foot exam was performed with the following findings: Yes 08/11/2022  4:23 PM  Visual Inspection No deformities, no ulcerations, no other skin breakdown bilaterally: Yes Sensation Testing Intact to touch and monofilament testing bilaterally: Yes Pulse Check Posterior Tibialis and Dorsalis pulse intact bilaterally: Yes Comments    ASSESSMENT: 1. DM2,  insulin-dependent, uncontrolled, with complications - DR  No family history of medullary thyroid cancer or personal history of pancreatitis.  2. Obesity class 1 BMI Classification: < 18.5 underweight  18.5-24.9 normal weight  25.0-29.9 overweight  30.0-34.9 class I obesity  35.0-39.9 class II obesity  ? 40.0 class III obesity   PLAN:  1. Patient with longstanding, uncontrolled, type 2 diabetes, on basal insulin, SGLT inhibitor and weekly GLP-1/GIP receptor agonist.  She was previously on Ozempic, but came off due to a rash (plaque psoriasis).  We discussed that it was unlikely that the rash was related to Ozempic, though.  However, since last visit, a PA for [facility] was approved and she started on this. -At today's visit, sugars are lower in the morning, at goal, and they are slightly higher, but still at goal after meals, except occasional blood sugars after dinner higher than 180.  However, she is planning to join [employer] after retirement, which is coming up.  Also, with more weight loss, I suspect that her blood sugars will continue to improve.  Since she does have occasional low blood sugars in the 70s at night, I advised her to reduce the dose of Lantus further. - I suggested to:  Patient Instructions  Please reduce: - Lantus to 25 units daily   Continue: - Mounjaro 7.5 mg weekly - Jardiance 25 mg daily in am  - we checked her HbA1c: 6.2% (lower) - advised to check sugars at different times of the day - 1x a day, rotating check  times - advised for yearly eye exams >> she is UTD - return to clinic in 4 months   2. Obesity class 1 -Previously doing intermittent fasting, then trying a plant-based diet but ending up with greens and increase instead. -At last visit, she gained 6 pounds after coming off Ozempic, despite continuing Jardiance -Since last visit, she was able to switch to [facility], which we will continue now -Reducing insulin will also help further  Philemon Kingdom, MD PhD [facility] Endocrinology

## 2022-08-11 NOTE — Patient Instructions (Signed)
Please reduce: - Lantus to 25 units daily   Continue: - Mounjaro 7.5 mg weekly - Jardiance 25 mg daily in am

## 2022-09-01 ENCOUNTER — Telehealth: Payer: Self-pay

## 2022-09-01 ENCOUNTER — Other Ambulatory Visit: Payer: Self-pay | Admitting: Internal Medicine

## 2022-09-01 DIAGNOSIS — E11319 Type 2 diabetes mellitus with unspecified diabetic retinopathy without macular edema: Secondary | ICD-10-CM

## 2022-09-01 NOTE — Telephone Encounter (Signed)
Pt requested rx for Mounjaro be sent to [facility] on [address] for the 10 mg dose that is in stock.

## 2022-09-01 NOTE — Telephone Encounter (Signed)
OK to send Ty! C

## 2022-09-02 MED ORDER — TIRZEPATIDE 10 MG/0.5ML ~~LOC~~ SOAJ: 10.0000 mg | SUBCUTANEOUS | 5 refills | Status: DC

## 2022-09-02 NOTE — Telephone Encounter (Signed)
Rx sent 

## 2022-09-03 NOTE — Telephone Encounter (Signed)
Pt was not able to get Mounjaro 10 mg dose. Wants to know if it will be ok to get 5 mg dose if that's all the pharmacy has in stock?

## 2022-09-05 MED ORDER — TIRZEPATIDE 5 MG/0.5ML ~~LOC~~ SOAJ: 5.0000 mg | SUBCUTANEOUS | 5 refills | Status: DC

## 2022-09-05 NOTE — Addendum Note (Signed)
Addended by: Lauralyn Primes on: 09/05/2022 05:42 PM   Modules accepted: Orders

## 2022-09-05 NOTE — Telephone Encounter (Signed)
Rx for 5 mg dose.

## 2022-09-11 ENCOUNTER — Telehealth: Payer: Self-pay

## 2022-09-11 NOTE — Telephone Encounter (Signed)
Mounjaro needs PA

## 2022-09-26 ENCOUNTER — Other Ambulatory Visit: Payer: Self-pay | Admitting: Internal Medicine

## 2022-09-30 ENCOUNTER — Other Ambulatory Visit (HOSPITAL_COMMUNITY): Payer: Self-pay

## 2022-10-01 ENCOUNTER — Other Ambulatory Visit (HOSPITAL_COMMUNITY): Payer: Self-pay

## 2022-10-01 ENCOUNTER — Telehealth: Payer: Self-pay

## 2022-10-01 NOTE — Telephone Encounter (Signed)
Patient Advocate Encounter   Received notification from pt msgs that prior authorization is required for [facility]  Submitted: 10/01/22 Key [id number]  Sent for expedited review  APPROVED  PA# [phone]

## 2022-10-02 NOTE — Telephone Encounter (Signed)
Patient advised and will contact pharmacy  

## 2022-10-23 DIAGNOSIS — Z794 Long term (current) use of insulin: Secondary | ICD-10-CM | POA: Diagnosis not present

## 2022-10-23 DIAGNOSIS — Z87891 Personal history of nicotine dependence: Secondary | ICD-10-CM | POA: Diagnosis not present

## 2022-10-23 DIAGNOSIS — E663 Overweight: Secondary | ICD-10-CM | POA: Diagnosis not present

## 2022-10-23 DIAGNOSIS — E119 Type 2 diabetes mellitus without complications: Secondary | ICD-10-CM | POA: Diagnosis not present

## 2022-10-23 DIAGNOSIS — I10 Essential (primary) hypertension: Secondary | ICD-10-CM | POA: Diagnosis not present

## 2022-11-15 ENCOUNTER — Other Ambulatory Visit: Payer: Self-pay | Admitting: Internal Medicine

## 2022-11-25 NOTE — Progress Notes (Signed)
Subjective:    Kristen [name] is a 72 y.o. female and is here for a comprehensive physical exam.  HPI  Health Maintenance Due  Topic Date Due   OPHTHALMOLOGY EXAM  05/16/2018   Diabetic kidney evaluation - Urine ACR  11/09/2021   DEXA SCAN  01/25/2022   Diabetic kidney evaluation - eGFR measurement  09/07/2022   DTaP/Tdap/Td (2 - Td or Tdap) 09/09/2022    Acute Concerns: None  Chronic Issues: Type 2 Diabetes Mellitus Treated with Mounjaro 5 mg weekly, Jardiance 25 mg daily before breakfast, insulin 25 units daily. Sees endo. No negative side effects from [facility]. Does not tolerate Ozempic- GI issues. HGBA1c at 6.2% on 08/11/22.  Hypertension Treated with losartan 100 mg daily Blood pressure normal today at 130/80. Home blood pressure readings are as follows: 117/58.  Labs are pending. Hearing is good. Denies unusual headaches, GI symptoms. Ankle swelling is improved. Has had Covid-19 twice- symptoms were mild.  Health Maintenance: Immunizations -- Due for tetanus vaccine. Colonoscopy -- Last completed 01/27/20. Showed diverticulosis in sigmoid colon. Recommended repeat in 2026. Mammogram -- Last completed 04/07/22. No mammographic evidence of malignancy. Recommended repeat in 2024. PAP -- Last completed 09/26/17. Negative for intraepithelial lesion or malignancy.  Bone Density -- Last completed 01/26/19. T-score -2.1 at left femur neck. Recommended repeat in 2023. Diet -- Eating healthy. Exercise -- Exercises regularly.  Sleep habits -- Stable. Mood -- Stable.  UTD with dentist? - Not UTD- not needed. Only has two teeth. UTD with eye doctor? - Will be scheduling follow-up for 09/2022. History of diabetic retinopathy.   Weight history: Wt Readings from Last 10 Encounters:  11/26/22 155 lb (70.3 kg)  08/11/22 163 lb 6.4 oz (74.1 kg)  04/01/22 176 lb 3.2 oz (79.9 kg)  01/10/22 174 lb 6.4 oz (79.1 kg)  12/03/21 170 lb 9.6 oz (77.4 kg)  12/02/21 175 lb (79.4 kg)   09/27/21 170 lb (77.1 kg)  09/13/21 165 lb (74.8 kg)  09/02/21 172 lb 12.8 oz (78.4 kg)  08/12/21 172 lb 8 oz (78.2 kg)   Body mass index is 29.77 kg/m. No LMP recorded (lmp unknown). Patient is postmenopausal.  Alcohol use:  reports no history of alcohol use.  Tobacco use:  Tobacco Use: Medium Risk (11/26/2022)   Patient History    Smoking Tobacco Use: Former    Smokeless Tobacco Use: Never    Passive Exposure: Never   Eligible for lung cancer screening? no     11/26/2022   11:22 AM  Depression screen PHQ 2/9  Decreased Interest 0  Down, Depressed, Hopeless 0  PHQ - 2 Score 0     Other providers/specialists: Patient Care Team: Jarold Motto, [place] as PCP - General (Physician Assistant) Orbie Pyo, MD as PCP - Cardiology (Cardiology) Carlus Pavlov, MD as Consulting Physician (Internal Medicine) Tedd Sias, MD as Referring Physician (Optometry)    PMHx, SurgHx, SocialHx, Medications, and Allergies were reviewed in the Visit Navigator and updated as appropriate.   Past Medical History:  Diagnosis Date   Colon polyps    Diabetes mellitus    Glaucoma    History of chicken pox    HTN (hypertension)    Meralgia paresthetica      Past Surgical History:  Procedure Laterality Date   BREAST CYST EXCISION Left    patient states [id number] [id number]   CHOLECYSTECTOMY  06/16/2008   LEFT HEART CATH AND CORONARY ANGIOGRAPHY N/A 09/13/2021   Procedure: LEFT HEART CATH AND CORONARY  ANGIOGRAPHY;  Surgeon: Orbie Pyo, MD;  Location: [facility];  Service: Cardiovascular;  Laterality: N/A;   TONSILLECTOMY AND ADENOIDECTOMY       Family History  Problem Relation Age of Onset   Hypertension Mother    Diabetes Mother    Thyroid disease Mother    Diabetes Father    Cancer Father        Colon cancer   Hypertension Father    Diabetes Sister    Diabetes Sister    Diabetes Sister    Diabetes Sister     Social History   Tobacco Use   Smoking  status: Former    Packs/day: 0.75    Years: 4.00    Additional pack years: 0.00    Total pack years: 3.00    Types: Cigarettes    Quit date: 1975    Years since quitting: 49.4    Passive exposure: Never   Smokeless tobacco: Never  Vaping Use   Vaping Use: Never used  Substance Use Topics   Alcohol use: No   Drug use: No    Review of Systems:   Review of Systems  Constitutional:  Negative for chills, fever, malaise/fatigue and weight loss.  HENT:  Negative for hearing loss, sinus pain and sore throat.   Respiratory:  Negative for cough, hemoptysis and shortness of breath.   Cardiovascular:  Negative for chest pain, palpitations, leg swelling and PND.  Gastrointestinal:  Negative for abdominal pain, constipation, diarrhea, heartburn, nausea and vomiting.  Genitourinary:  Negative for dysuria, frequency and urgency.  Musculoskeletal:  Negative for back pain, myalgias and neck pain.  Skin:  Negative for itching and rash.  Neurological:  Negative for dizziness, tingling, seizures and headaches.  Endo/Heme/Allergies:  Negative for polydipsia.  Psychiatric/Behavioral:  Negative for depression. The patient is not nervous/anxious.     Objective:   BP 130/80 (BP Location: Left Arm, Patient Position: Sitting, Cuff Size: Normal)   Pulse 65   Temp (!) 97.5 F (36.4 C) (Temporal)   Ht 5' 0.5" (1.537 m)   Wt 155 lb (70.3 kg)   LMP  (LMP Unknown)   SpO2 98%   BMI 29.77 kg/m  Body mass index is 29.77 kg/m.   General Appearance:    Alert, cooperative, no distress, appears stated age  Head:    Normocephalic, without obvious abnormality, atraumatic  Eyes:    PERRL, conjunctiva/corneas clear, EOM's intact, fundi    benign, both eyes  Ears:    Normal TM's and external ear canals, both ears  Nose:   Nares normal, septum midline, mucosa normal, no drainage    or sinus tenderness  Throat:   Lips, mucosa, and tongue normal; teeth and gums normal  Neck:   Supple, symmetrical, trachea  midline, no adenopathy;    thyroid:  no enlargement/tenderness/nodules; no carotid   bruit or JVD  Back:     Symmetric, no curvature, ROM normal, no CVA tenderness  Lungs:     Clear to auscultation bilaterally, respirations unlabored  Chest Wall:    No tenderness or deformity   Heart:    Regular rate and rhythm, S1 and S2 normal, no murmur, rub or gallop  Breast Exam:    Deferred  Abdomen:     Soft, non-tender, bowel sounds active all four quadrants,    no masses, no organomegaly  Genitalia:    Deferred   Extremities:   Extremities normal, atraumatic, no cyanosis or edema  Pulses:   2+ and  symmetric all extremities  Skin:   Skin color, texture, turgor normal, no rashes or lesions  Lymph nodes:   Cervical, supraclavicular, and axillary nodes normal  Neurologic:   CNII-XII intact, normal strength, sensation and reflexes    throughout    Assessment/Plan:   Routine physical examination Today patient counseled on age appropriate routine health concerns for screening and prevention, each reviewed and up to date or declined. Immunizations reviewed and up to date or declined. Labs ordered and reviewed. Risk factors for depression reviewed and negative. Hearing function and visual acuity are intact. ADLs screened and addressed as needed. Functional ability and level of safety reviewed and appropriate. Education, counseling and referrals performed based on assessed risks today. Patient provided with a copy of personalized plan for preventive services.   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, with long-term current use of insulin (HCC) Doing well with current regimen Compliant with endo recommendation Continue healthy lifestyle efforts She states that she was told she no longer needs statin by cards  Hypertension due to endocrine disorder Normotensive Continue losartan 100 mg daily Follow-up in 1 year(s), sooner if  concerns  Overweight Continue healthy lifestyle efforts  Osteopenia of lumbar spine Will re order DEXA    I,Alexander Ruley,acting as a scribe for [employer], PA.,have documented all relevant documentation on the behalf of Jarold Motto, PA,as directed by  Jarold Motto, PA while in the presence of Jarold Motto, [place].   I, Jarold Motto, [place], have reviewed all documentation for this visit. The documentation on 11/26/22 for the exam, diagnosis, procedures, and orders are all accurate and complete.    Jarold Motto, PA-C [facility]

## 2022-11-26 ENCOUNTER — Ambulatory Visit (INDEPENDENT_AMBULATORY_CARE_PROVIDER_SITE_OTHER): Payer: PPO | Admitting: Physician Assistant

## 2022-11-26 ENCOUNTER — Encounter: Payer: Self-pay | Admitting: Physician Assistant

## 2022-11-26 VITALS — BP 130/80 | HR 65 | Temp 97.5°F | Ht 60.5 in | Wt 155.0 lb

## 2022-11-26 DIAGNOSIS — M8588 Other specified disorders of bone density and structure, other site: Secondary | ICD-10-CM

## 2022-11-26 DIAGNOSIS — Z0000 Encounter for general adult medical examination without abnormal findings: Secondary | ICD-10-CM | POA: Diagnosis not present

## 2022-11-26 DIAGNOSIS — Z13220 Encounter for screening for lipoid disorders: Secondary | ICD-10-CM

## 2022-11-26 DIAGNOSIS — E663 Overweight: Secondary | ICD-10-CM

## 2022-11-26 DIAGNOSIS — Z794 Long term (current) use of insulin: Secondary | ICD-10-CM | POA: Diagnosis not present

## 2022-11-26 DIAGNOSIS — E113591 Type 2 diabetes mellitus with proliferative diabetic retinopathy without macular edema, right eye: Secondary | ICD-10-CM | POA: Diagnosis not present

## 2022-11-26 DIAGNOSIS — I152 Hypertension secondary to endocrine disorders: Secondary | ICD-10-CM | POA: Diagnosis not present

## 2022-11-26 LAB — CBC WITH DIFFERENTIAL/PLATELET
Basophils Absolute: 0 10*3/uL (ref 0.0–0.1)
Basophils Relative: 0.8 % (ref 0.0–3.0)
Eosinophils Absolute: 0.1 10*3/uL (ref 0.0–0.7)
Eosinophils Relative: 2.2 % (ref 0.0–5.0)
HCT: 41.7 % (ref 36.0–46.0)
Hemoglobin: 13.4 g/dL (ref 12.0–15.0)
Lymphocytes Relative: 37 % (ref 12.0–46.0)
Lymphs Abs: 1.8 10*3/uL (ref 0.7–4.0)
MCHC: 32.1 g/dL (ref 30.0–36.0)
MCV: 96.3 fl (ref 78.0–100.0)
Monocytes Absolute: 0.3 10*3/uL (ref 0.1–1.0)
Monocytes Relative: 6.2 % (ref 3.0–12.0)
Neutro Abs: 2.6 10*3/uL (ref 1.4–7.7)
Neutrophils Relative %: 53.8 % (ref 43.0–77.0)
Platelets: 140 10*3/uL — ABNORMAL LOW (ref 150.0–400.0)
RBC: 4.33 Mil/uL (ref 3.87–5.11)
RDW: 13.9 % (ref 11.5–15.5)
WBC: 4.8 10*3/uL (ref 4.0–10.5)

## 2022-11-26 LAB — COMPREHENSIVE METABOLIC PANEL
ALT: 18 U/L (ref 0–35)
AST: 22 U/L (ref 0–37)
Albumin: 4.2 g/dL (ref 3.5–5.2)
Alkaline Phosphatase: 92 U/L (ref 39–117)
BUN: 12 mg/dL (ref 6–23)
CO2: 25 mEq/L (ref 19–32)
Calcium: 9.4 mg/dL (ref 8.4–10.5)
Chloride: 107 mEq/L (ref 96–112)
Creatinine, Ser: 0.69 mg/dL (ref 0.40–1.20)
GFR: 87.3 mL/min (ref >60.00)
Glucose, Bld: 85 mg/dL (ref 70–99)
Potassium: 4.9 mEq/L (ref 3.5–5.1)
Sodium: 138 mEq/L (ref 135–145)
Total Bilirubin: 0.4 mg/dL (ref 0.2–1.2)
Total Protein: 6.4 g/dL (ref 6.0–8.3)

## 2022-11-26 LAB — LIPID PANEL
Cholesterol: 110 mg/dL (ref 0–200)
HDL: 43 mg/dL (ref >39.00)
LDL Cholesterol: 52 mg/dL (ref 0–99)
NonHDL: 66.77
Total CHOL/HDL Ratio: 3
Triglycerides: 75 mg/dL (ref 0.0–149.0)
VLDL: 15 mg/dL (ref 0.0–40.0)

## 2022-11-26 LAB — MICROALBUMIN / CREATININE URINE RATIO
Creatinine,U: 37.6 mg/dL
Microalb Creat Ratio: 1.9 mg/g (ref 0.0–30.0)
Microalb, Ur: <0.7 mg/dL (ref 0.0–1.9)

## 2022-11-26 NOTE — Patient Instructions (Addendum)
It was great to see you! CONGRATS ON YOUR RETIREMENT!  We will place an order for a bone density to be completed this year with your annual physical.  Please go to the lab for blood work.   Our office will call you with your results unless you have chosen to receive results via MyChart.  If your blood work is normal we will follow-up each year for physicals and as scheduled for chronic medical problems.  If anything is abnormal we will treat accordingly and get you in for a follow-up.  Take care,  [name]

## 2022-12-16 ENCOUNTER — Encounter: Payer: Self-pay | Admitting: Internal Medicine

## 2022-12-16 ENCOUNTER — Ambulatory Visit: Payer: PPO | Admitting: Internal Medicine

## 2022-12-16 VITALS — BP 132/80 | HR 74 | Ht 60.5 in | Wt 156.2 lb

## 2022-12-16 DIAGNOSIS — Z7985 Long-term (current) use of injectable non-insulin antidiabetic drugs: Secondary | ICD-10-CM

## 2022-12-16 DIAGNOSIS — Z7984 Long term (current) use of oral hypoglycemic drugs: Secondary | ICD-10-CM

## 2022-12-16 DIAGNOSIS — Z794 Long term (current) use of insulin: Secondary | ICD-10-CM

## 2022-12-16 DIAGNOSIS — E11319 Type 2 diabetes mellitus with unspecified diabetic retinopathy without macular edema: Secondary | ICD-10-CM | POA: Diagnosis not present

## 2022-12-16 DIAGNOSIS — E669 Obesity, unspecified: Secondary | ICD-10-CM

## 2022-12-16 DIAGNOSIS — E119 Type 2 diabetes mellitus without complications: Secondary | ICD-10-CM

## 2022-12-16 DIAGNOSIS — E66811 Obesity, class 1: Secondary | ICD-10-CM

## 2022-12-16 LAB — HEMOGLOBIN A1C: Hemoglobin A1C: 5.8

## 2022-12-16 MED ORDER — TRULICITY 3 MG/0.5ML ~~LOC~~ SOAJ: 3.0000 mg | SUBCUTANEOUS | 3 refills | Status: DC

## 2022-12-16 NOTE — Patient Instructions (Addendum)
Please continue: - Lantus 25 units daily  - Jardiance 25 mg daily in am  When you run out [facility], take: - Trulicity 3 mg weekly  Please return in 4-6 months.

## 2022-12-16 NOTE — Progress Notes (Signed)
Patient ID: Kristen [name], female   DOB: Sep 05, 1950, 72 y.o.   MRN: [id number]  HPI: Kristen [name] is a 72 y.o.-year-old female, returning for f/u for DM2, dx in 2000, insulin-dependent since 2012, uncontrolled, with complications (DR).  Last visit 4 months ago.  Interim history: No increased urination, blurry vision, nausea, chest pain. She had a little diarrhea at last visit and >> resolved.  Now only has diarrhea after eating pho soup. She joined the gym and also goes in a sauna 3x a week. Her Gregg[employer] Keen is now $$ - in doughnut hole.  Reviewed HbA1c levels: Lab Results  Component Value Date   HGBA1C 6.2 (A) 08/11/2022   HGBA1C 7.8 (A) 04/01/2022   HGBA1C 7.0 (A) 09/27/2021   HGBA1C 7.9 (A) 05/17/2021   HGBA1C 7.4 (H) 11/09/2020   HGBA1C 7.6 (A) 08/27/2020   HGBA1C 7.3 (A) 04/18/2020   HGBA1C 7.7 (H) 11/24/2019   HGBA1C 7.6 (H) 03/02/2019   HGBA1C 6.7 (A) 06/10/2018   HGBA1C 6.9 (A) 02/25/2018   HGBA1C 8.6 (A) 10/22/2017   HGBA1C 8.4 07/24/2017   HGBA1C 9.4 04/23/2017   HGBA1C 8.7 11/21/2016   HGBA1C 8.5 08/22/2016   HGBA1C 9.3 04/09/2016   HGBA1C 9.3 10/30/2015   HGBA1C 8.6 07/18/2015   HGBA1C 9.9 04/16/2015   She is on: - Lantus  40 units 2x a day >> .Marland KitchenMarland Kitchen 40 units in am >> 30-(40 ) >> 25 units in am - Jardiance 25 mg before breakfast >> restarted 11/2020 - Mounjaro 7.5 >> 10 mg weekly She stopped  Victoza b/c $$$. Trulicity not covered. She was on Jardiance 25 mg daily before b'fast. - added 03/2016 - stopped 06/2016  >> sugars increased >> at last visit, I advised her to restart >> did not do so as this was expensive. She tried Metformin ER 500 mg >> still N/D/AP. She was previously on Basaglar 45 units daily.  She was also previously on Humalog 20 to 25 units before lunch and dinner. She was previously on Ozempic -started 04/2020, then off, then restarted 11/2020.  She could not continue due to price.  Pt checks her sugars 3-4 times a day: - am: 105-128, 175, 180  >>  104-110, 158 >> 95-110 >> 96-111 >> 97-109 - 2h after b'fast:  152  >> n/c >> 267 >> n/c - before lunch: 130-135 >> 130 (w/o Humalog) >> 180 >> n/c - 2h after lunch:  130-165, 180 >> 161-201 >> n/c >> 140-160 >> 160 - before dinner:  73, 110-173 >> n/c >> 125-132 >> n/c  - 2h after dinner: 180-189, 250 >> 180-230 >> 160-190, 240 >> 159-200 - bedtime: 175-200 >> 173, 180 >> 160s >> n/c - nighttime: 65 >> n/c >> 240-285 >> n/c >> 69 >> 78 >> n/c Lowest sugar was 69 >> 78 >> 97; she has hypoglycemia awareness in the 70s. Highest sugar was 285 ...>> 200.  Glucometer: AccuChek  She does intermittent fasting. Meals: - brunch: grits + meat + 2 eggs - +/- snack tuna, boiled egg - dinner: meat + 3 veggies  -No CKD, last BUN/creatinine:  Lab Results  Component Value Date   BUN 12 11/26/2022   CREATININE 0.69 11/26/2022  On losartan.  -+ h/o HL; last set of lipids: Lab Results  Component Value Date   CHOL 110 11/26/2022   HDL 43.00 11/26/2022   LDLCALC 52 11/26/2022   TRIG 75.0 11/26/2022   CHOLHDL 3 11/26/2022  Not on a statin.  She  did not start Lipitor 40 mg daily as advised.  - last eye exam was in 05/2022: "healed" DR reportedly, + glaucoma, + small cataract. Dr. Audie Box [facility]  She also sees a retina specialist - Dr. Lita Mains.  -She denies numbness and tingling in her feet.  Last foot exam 08/11/2022.  She also has GERD, HTN. She has cardiomyopathy with a lower ejection fraction: 45-50%. She had a heart cath 09/13/2021 >> no blockages.   ROS: + see HPI  I reviewed pt's medications, allergies, PMH, social hx, family hx, and changes were documented in the history of present illness. Otherwise, unchanged from my initial visit note.  Past Medical History:  Diagnosis Date   Colon polyps    Diabetes mellitus    Glaucoma    History of chicken pox    HTN (hypertension)    Meralgia paresthetica    Past Surgical History:  Procedure Laterality Date   BREAST CYST  EXCISION Left    patient states btw [phone]   CHOLECYSTECTOMY  06/16/2008   LEFT HEART CATH AND CORONARY ANGIOGRAPHY N/A 09/13/2021   Procedure: LEFT HEART CATH AND CORONARY ANGIOGRAPHY;  Surgeon: Orbie Pyo, MD;  Location: [facility];  Service: Cardiovascular;  Laterality: N/A;   TONSILLECTOMY AND ADENOIDECTOMY     Social History   Social History   Marital Status: Widowed    Spouse Name: N/A   Number of Children: 1   Occupational History      Social History Main Topics   Smoking status: Never Smoker    Smokeless tobacco: Not on file   Alcohol Use: No   Drug Use: No   Current Outpatient Medications on File Prior to Visit  Medication Sig Dispense Refill   Ascorbic Acid (VITAMIN C) 1000 MG tablet Take 2,000 mg by mouth daily.     aspirin EC 81 MG tablet Take 81 mg by mouth daily. Swallow whole.     B Complex-C (SUPER B COMPLEX PO) Take 2 capsules by mouth daily.     Blood Glucose Monitoring Suppl (ONE TOUCH ULTRA 2) w/Device KIT Use to check blood sugar 3 times daily 1 kit 0   Cholecalciferol (VITAMIN D3) 50 MCG (2000 UT) CAPS Take 15,000 Units by mouth daily. otc     glucose blood (ONETOUCH ULTRA) test strip USE AS INSTRUCTED TO CHECK  SUGAR 3 TIMES DAILY 300 strip 4   insulin glargine (LANTUS SOLOSTAR) 100 UNIT/ML Solostar Pen Inject 25 Units into the skin daily. (Patient taking differently: Inject 30 Units into the skin daily.) 30 mL 0   Insulin Pen Needle 32G X 4 MM MISC Use 4x a day 400 each 3   JARDIANCE 25 MG TABS tablet TAKE 1 TABLET BY MOUTH EVERY DAY BEFORE BREAKFAST 90 tablet 2   KRILL OIL PO Take by mouth daily.     Lancets (ONETOUCH DELICA PLUS LANCET33G) MISC TEST 3 TIMES DAILY 300 each 1   losartan (COZAAR) 100 MG tablet Take 1 tablet (100 mg total) by mouth daily. 90 tablet 3   magnesium oxide (MAG-OX) 400 (240 Mg) MG tablet Take 1 tablet by mouth at bedtime.     Multiple Vitamin (MULTIVITAMIN) tablet Take 1 tablet by mouth daily.     tirzepatide  (MOUNJARO) 10 MG/0.5ML Pen Inject 10 mg into the skin once a week. 2 mL 5   No current facility-administered medications on file prior to visit.  LMP  (LMP Unknown)  Allergies  Allergen Reactions   Humalog [  Insulin Lispro] Other (See Comments)    Paresthesia - hands   Latex Hives    itching   Family History  Problem Relation Age of Onset   Hypertension Mother    Diabetes Mother    Thyroid disease Mother    Diabetes Father    Cancer Father        Colon cancer   Hypertension Father    Diabetes Sister    Diabetes Sister    Diabetes Sister    Diabetes Sister    PE:  BP 132/80   Pulse 74   Ht 5' 0.5" (1.537 m)   Wt 156 lb 3.2 oz (70.9 kg)   LMP  (LMP Unknown)   SpO2 99%   BMI 30.00 kg/m   Wt Readings from Last 3 Encounters:  12/16/22 156 lb 3.2 oz (70.9 kg)  11/26/22 155 lb (70.3 kg)  08/11/22 163 lb 6.4 oz (74.1 kg)   Constitutional: overweight, in NAD Eyes: EOMI, no exophthalmos ENT: no thyromegaly, no cervical lymphadenopathy Cardiovascular: RRR, No MRG Respiratory: CTA B Musculoskeletal: no deformities Skin: + Macular darkened discoloration on bilateral legs Neurological: + Mild tremor with outstretched hands  ASSESSMENT: 1. DM2, insulin-dependent, uncontrolled, with complications - DR  No family history of medullary thyroid cancer or personal history of pancreatitis.  2. Obesity class 1  PLAN:  1. Patient with longstanding, uncontrolled, type 2 diabetes, on basal insulin, managed Let's 2 inhibitor and weekly GLP-1/GIP receptor agonist.  She was previously on Ozempic but came off due to rash (plaque psoriasis).  We discussed that it was unlikely that the rash was related to Ozempic, though.  She had AP from it, too. However, we were able to start her on Mounjaro after, which she tolerates well.  A PA for the 10 mg dose was approved for her in 09/2022. -At last visit, sugars were lower in the morning, at goal and they were slightly higher but still at goal  after meals, with few exceptions after dinner higher than 180.  She was planning to join [employer] after retirement and since she had occasional low blood sugars in the 70s at night, I advised her to reduce her Lantus dose further.  HbA1c at that time 6.2%, improved. -At today's visit, sugars are at goal.  Unfortunately, we have to switch from [facility] to another medication due to price and being in the donut hole.  I suggested Trulicity, since she did not tolerate Ozempic well in the past.  I am hoping she would be able to obtain this. - I suggested to:  Patient Instructions  Please continue: - Lantus 25 units daily  - Jardiance 25 mg daily in am  When you run out [facility], take: - Trulicity 3 mg weekly  Please return in 4-6 months.  - we checked her HbA1c: 5.8% (lower) - advised to check sugars at different times of the day - 1x a day, rotating check times - advised for yearly eye exams >> she is UTD - return to clinic in 6 months   2. Obesity class 1 -Previously doing intermittent fasting, then trying a plant-based diet but ending up with greens and increase instead. -She lost a significant amount of weight on Mounjaro, in the last 8 months: 20 pounds (7 pounds since last visit) -She is on Mounjaro 10 mg daily, however, she tells me that she would not be able to continue with this due to being in the donut hole.  We discussed about trying Trulicity, but  let me know if this is also expensive.  Ozempic was not well-tolerated in the past due to abdominal pain.  However, she is open to try Rybelsus, if Trulicity is not affordable or not available. -She also started to go to the gym and she would like to cut down on her medicines as much as possible.   Carlus Pavlov, MD PhD [facility] Endocrinology

## 2022-12-22 ENCOUNTER — Telehealth: Payer: Self-pay | Admitting: Physician Assistant

## 2022-12-22 ENCOUNTER — Other Ambulatory Visit: Payer: Self-pay | Admitting: Internal Medicine

## 2022-12-22 MED ORDER — TETANUS-DIPHTH-ACELL PERTUSSIS 5-2.5-18.5 LF-MCG/0.5 IM SUSP: 0.5000 mL | Freq: Once | INTRAMUSCULAR | 0 refills | Status: AC

## 2022-12-22 NOTE — Telephone Encounter (Signed)
Pt notified Rx was sent to pharmacy for Tdap.

## 2022-12-22 NOTE — Telephone Encounter (Signed)
Patient states she went to [facility] to [facility] tdap administered per pcp recommendation but they informed her that an rx for the vaccine needs to be written to be covered by insurance. Pt requests this rx order for tdap be [facility] to [facility] on [address].

## 2022-12-22 NOTE — Telephone Encounter (Signed)
Rx for Tdap sent to [facility] pharmacy.

## 2023-01-06 IMAGING — MG MM DIGITAL DIAGNOSTIC UNILAT*R* W/ TOMO W/ CAD
6 series · 6 of 18 positions shown · non-contrast
Comparison: Previous exam(s).

ACR Breast Density Category a: The breast tissue is almost entirely
fatty.

CLINICAL DATA: 70-year-old with an erythematous rash throughout her
body but with a particular large lesion with a central black punctum
on the skin of the RIGHT breast. The patient has undergone treatment
with prednisone and antibiotics with improvement in the rash.

EXAM:
DIGITAL DIAGNOSTIC UNILATERAL RIGHT MAMMOGRAM WITH TOMOSYNTHESIS AND
CAD
TECHNIQUE: Right digital diagnostic mammography and breast tomosynthesis was
performed. The images were evaluated with computer-aided detection.

[R TAN synth-2D]
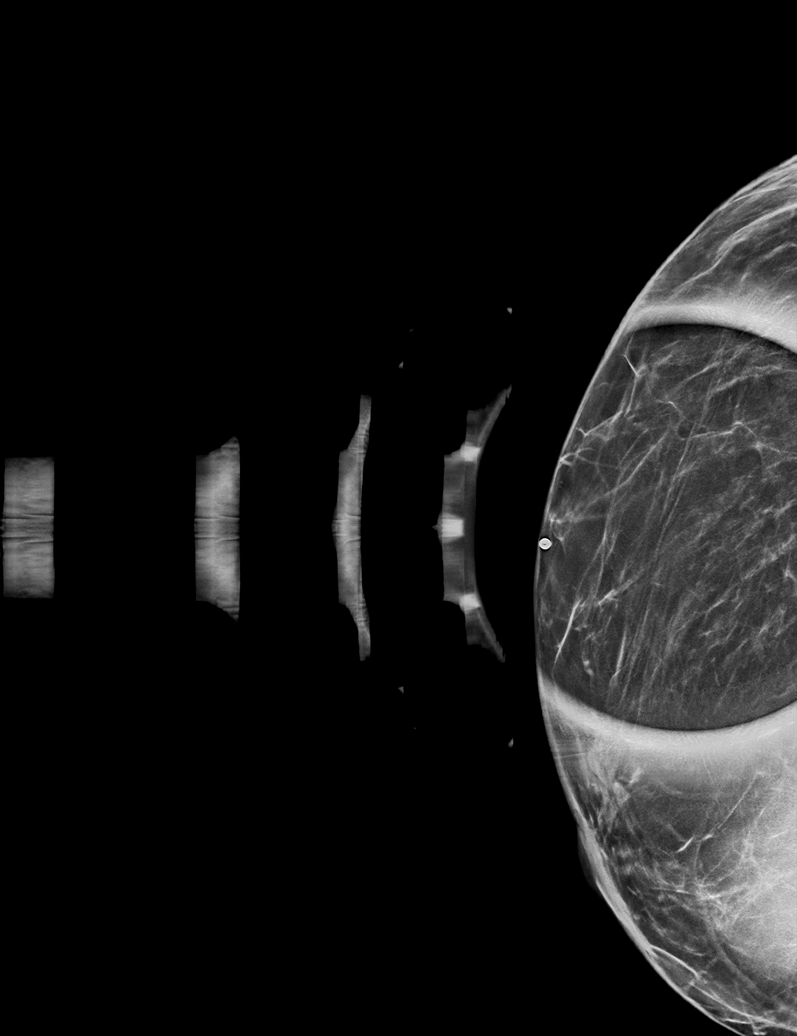

[R CC synth-2D]
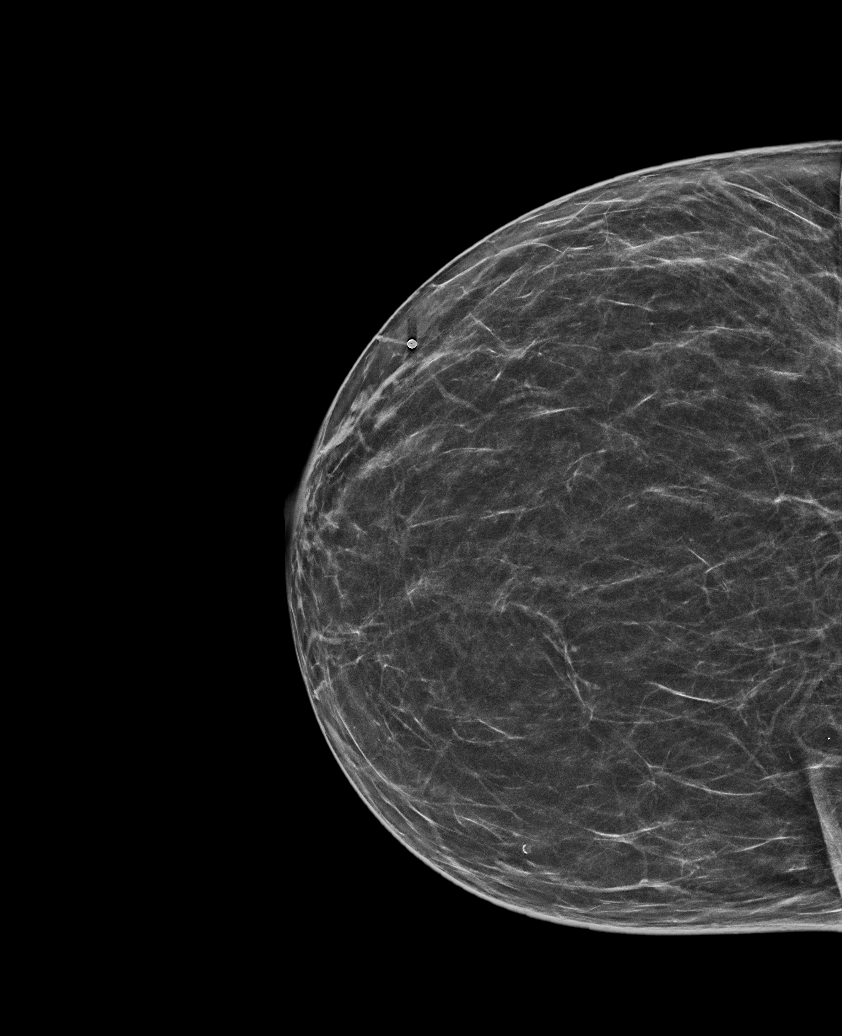

[R MLO synth-2D]
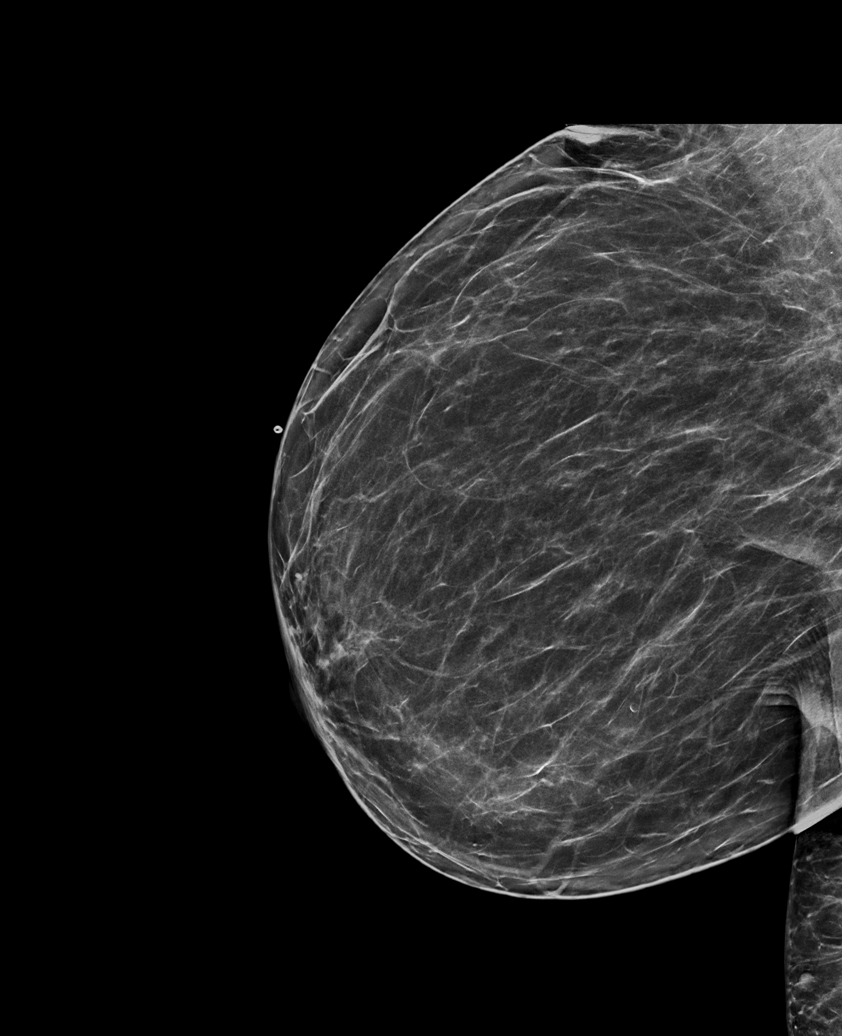

[R CC tomo · tomo slice 33/65.0]
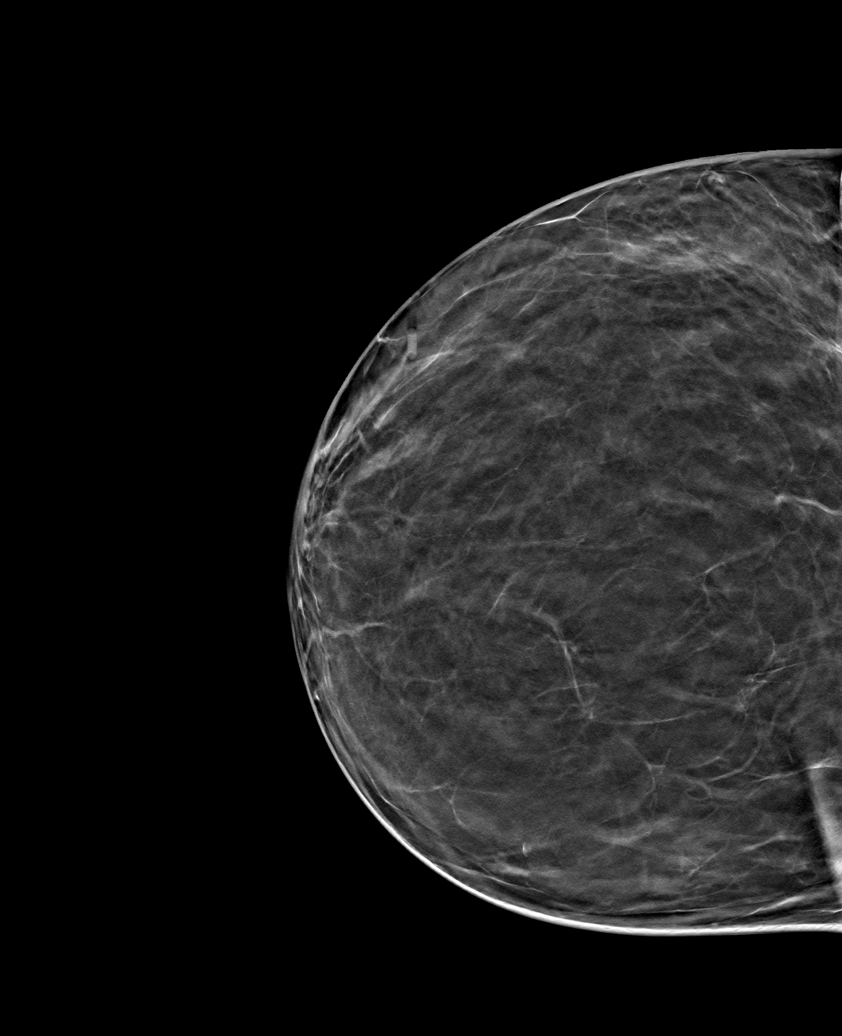

[R TAN tomo · tomo slice 31/62.0]
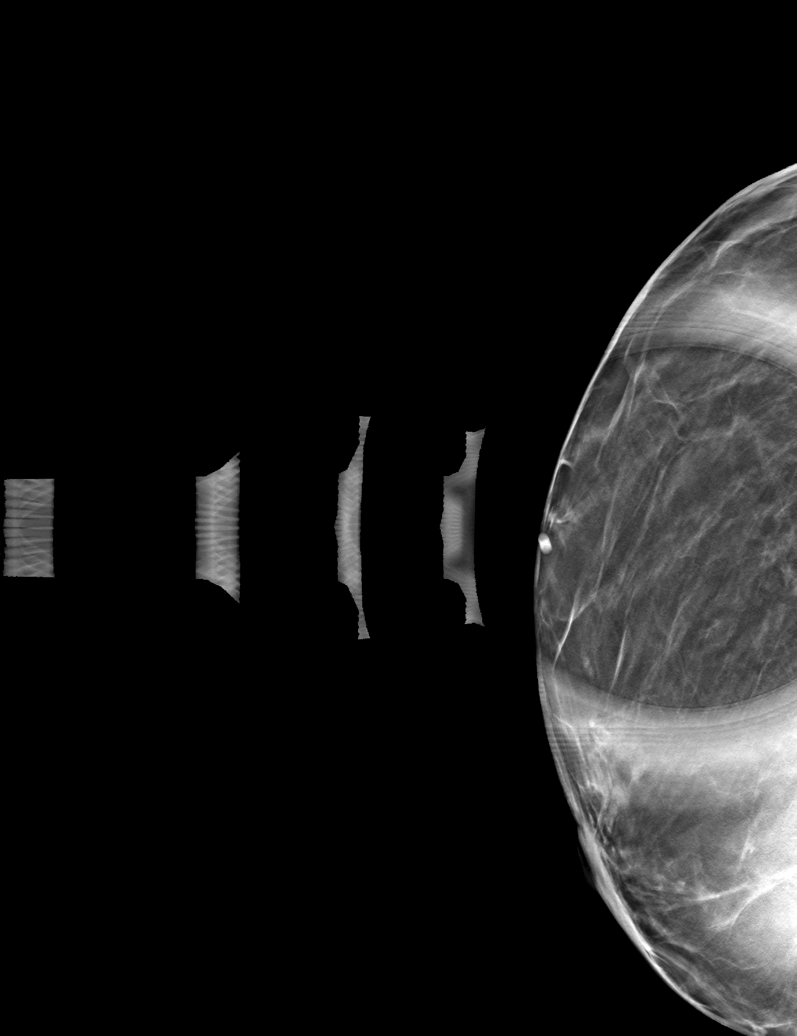

[R MLO tomo · tomo slice 37/74.0]
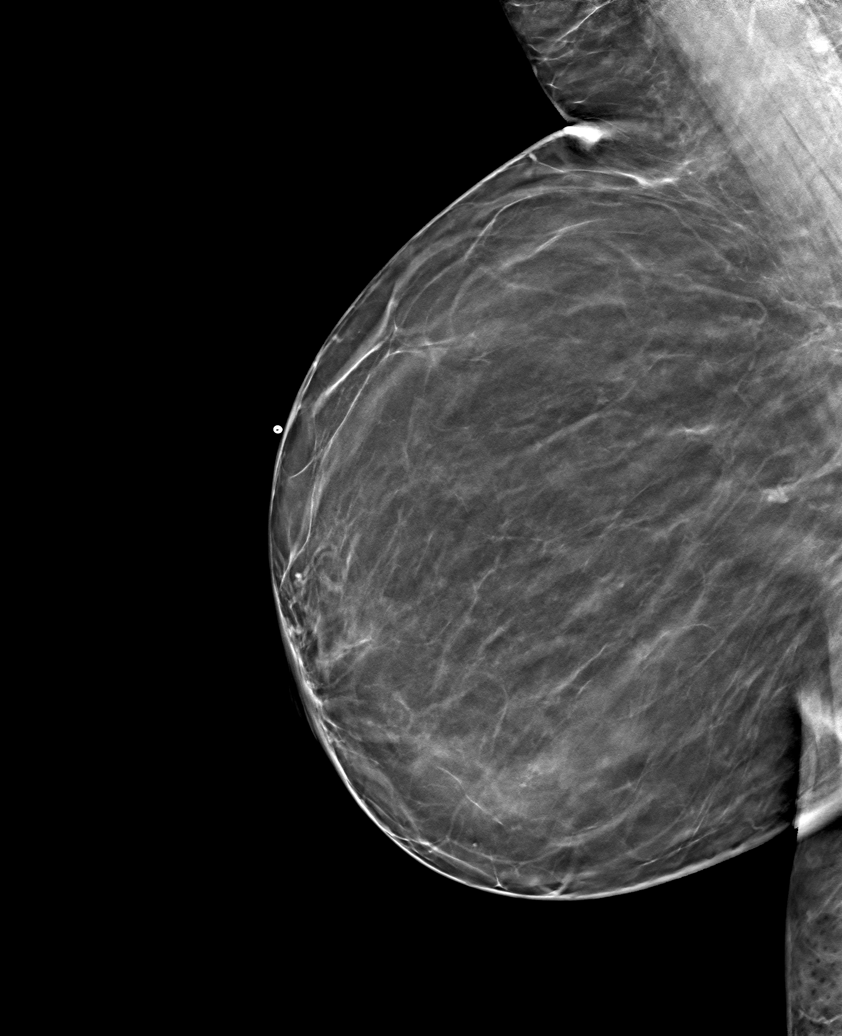

[6 of 18 positions shown; findings below may reference images not displayed]

FINDINGS: Full field CC and MLO views were obtained.

No findings suspicious for malignancy.

On correlative physical examination, there is a pink colored
circular rash on the upper periareolar RIGHT breast with a central
black punctum. A pink colored rash is present on the trunk and
lateral RIGHT chest wall as well.
IMPRESSION: No mammographic evidence of malignancy involving the RIGHT breast.

RECOMMENDATION:
Annual BILATERAL screening mammography which is due in December 2021.

I have discussed the findings and recommendations with the patient.
If applicable, a reminder letter will be sent to the patient
regarding the next appointment.

BI-RADS CATEGORY  1: Negative.

## 2023-02-13 ENCOUNTER — Other Ambulatory Visit: Payer: Self-pay | Admitting: Physician Assistant

## 2023-02-13 ENCOUNTER — Other Ambulatory Visit: Payer: Self-pay | Admitting: Internal Medicine

## 2023-02-13 DIAGNOSIS — Z0000 Encounter for general adult medical examination without abnormal findings: Secondary | ICD-10-CM

## 2023-02-16 ENCOUNTER — Other Ambulatory Visit: Payer: Self-pay | Admitting: Internal Medicine

## 2023-02-17 NOTE — Telephone Encounter (Signed)
I do not see this on her current med list. Ok to send?

## 2023-02-18 ENCOUNTER — Telehealth: Payer: Self-pay

## 2023-02-18 ENCOUNTER — Other Ambulatory Visit: Payer: Self-pay | Admitting: Internal Medicine

## 2023-02-18 DIAGNOSIS — E11319 Type 2 diabetes mellitus with unspecified diabetic retinopathy without macular edema: Secondary | ICD-10-CM

## 2023-02-18 MED ORDER — TIRZEPATIDE 10 MG/0.5ML ~~LOC~~ SOAJ
10.0000 mg | SUBCUTANEOUS | 1 refills | Status: DC
Start: 2023-02-18 — End: 2023-06-25

## 2023-02-18 MED ORDER — EMPAGLIFLOZIN 25 MG PO TABS: 25.0000 mg | ORAL_TABLET | Freq: Every day | ORAL | 2 refills | Status: DC

## 2023-02-18 NOTE — Telephone Encounter (Signed)
Requested Prescriptions   Signed Prescriptions Disp Refills   empagliflozin (JARDIANCE) 25 MG TABS tablet 90 tablet 2    Sig: Take 1 tablet (25 mg total) by mouth daily. TAKE 1 TABLET BY MOUTH EVERY DAY BEFORE BREAKFAST    Authorizing Provider: Carlus Pavlov    Ordering User: Inesha Sow S   tirzepatide (MOUNJARO) 10 MG/0.5ML Pen 6 mL 1    Sig: Inject 10 mg into the skin once a week.    Authorizing Provider: Carlus Pavlov    Ordering User: Pollie Meyer

## 2023-02-18 NOTE — Telephone Encounter (Signed)
Patient called requesting a refill on Mounjaro 10.5 mg instead of taking the Trulicity. She is willing to pay for it oop but I did not want to make the change until you gave the ok. I have sent a refill on her Jardiance.

## 2023-02-20 ENCOUNTER — Telehealth: Payer: Self-pay

## 2023-02-20 DIAGNOSIS — E11319 Type 2 diabetes mellitus with unspecified diabetic retinopathy without macular edema: Secondary | ICD-10-CM

## 2023-02-20 NOTE — Telephone Encounter (Signed)
Patient called and left a vm stating she is in the donut hole and can not afford the [facility] or Jardiance. She also wanted you to know she will not be taking it anymore. She will be working on her diet and exercising.

## 2023-02-20 NOTE — Telephone Encounter (Signed)
OK, but please let us know if the sugars increase, we will need to either have her get the medications through patient assistance or substitute the medications with something else, especially with the holidays coming up.

## 2023-02-25 MED ORDER — FREESTYLE LIBRE 3 READER DEVI: 0 refills | Status: DC

## 2023-02-25 MED ORDER — FREESTYLE LIBRE 3 PLUS SENSOR MISC: 1.0000 | 3 refills | Status: DC

## 2023-02-25 NOTE — Telephone Encounter (Signed)
Requested Prescriptions   Signed Prescriptions Disp Refills   Continuous Glucose Sensor (FREESTYLE LIBRE 3 PLUS SENSOR) MISC 6 each 3    Sig: Inject 1 Device into the skin continuous. Change sensor every 15 days, use to monitor glucose continuously    Authorizing Provider: Carlus Pavlov    Ordering User: Leshawn Houseworth S   Continuous Glucose Receiver (FREESTYLE LIBRE 3 READER) DEVI 1 each 0    Sig: Use to monitor glucose continuously    Authorizing Provider: Carlus Pavlov    Ordering User: Pollie Meyer

## 2023-02-25 NOTE — Telephone Encounter (Signed)
Yes, I am hoping that the [place] 3 is covered - let's send 6 with 3 refills to her pharmacy.  I am not sure if she also needs her receiver or she wants to check with her phone (preferable).Marland KitchenMarland Kitchen

## 2023-02-25 NOTE — Addendum Note (Signed)
Addended by: Pollie Meyer on: 02/25/2023 10:51 AM   Modules accepted: Orders

## 2023-02-25 NOTE — Telephone Encounter (Signed)
I called and spoke with the patient she is going to keep an eye on her bloopd sugars. She is going to fill out the application for Jardiance and just stay off of the [facility] at this time. She is also wanting to know if she can start on the FreeStyle [place]?  *PAP for [place] [facility] has been placed in the mail.*

## 2023-03-02 ENCOUNTER — Other Ambulatory Visit: Payer: Self-pay | Admitting: Internal Medicine

## 2023-03-02 DIAGNOSIS — E11319 Type 2 diabetes mellitus with unspecified diabetic retinopathy without macular edema: Secondary | ICD-10-CM

## 2023-03-25 ENCOUNTER — Encounter: Payer: Self-pay | Admitting: Internal Medicine

## 2023-03-25 DIAGNOSIS — H52223 Regular astigmatism, bilateral: Secondary | ICD-10-CM | POA: Diagnosis not present

## 2023-03-25 DIAGNOSIS — H524 Presbyopia: Secondary | ICD-10-CM | POA: Diagnosis not present

## 2023-03-25 DIAGNOSIS — E113212 Type 2 diabetes mellitus with mild nonproliferative diabetic retinopathy with macular edema, left eye: Secondary | ICD-10-CM | POA: Diagnosis not present

## 2023-03-25 DIAGNOSIS — E113291 Type 2 diabetes mellitus with mild nonproliferative diabetic retinopathy without macular edema, right eye: Secondary | ICD-10-CM | POA: Diagnosis not present

## 2023-03-25 LAB — HM DIABETES EYE EXAM

## 2023-04-02 DIAGNOSIS — H43813 Vitreous degeneration, bilateral: Secondary | ICD-10-CM | POA: Diagnosis not present

## 2023-04-02 DIAGNOSIS — H43391 Other vitreous opacities, right eye: Secondary | ICD-10-CM | POA: Diagnosis not present

## 2023-04-02 DIAGNOSIS — E113391 Type 2 diabetes mellitus with moderate nonproliferative diabetic retinopathy without macular edema, right eye: Secondary | ICD-10-CM | POA: Diagnosis not present

## 2023-04-02 DIAGNOSIS — H35033 Hypertensive retinopathy, bilateral: Secondary | ICD-10-CM | POA: Diagnosis not present

## 2023-04-02 DIAGNOSIS — H35412 Lattice degeneration of retina, left eye: Secondary | ICD-10-CM | POA: Diagnosis not present

## 2023-04-02 DIAGNOSIS — E113312 Type 2 diabetes mellitus with moderate nonproliferative diabetic retinopathy with macular edema, left eye: Secondary | ICD-10-CM | POA: Diagnosis not present

## 2023-04-09 ENCOUNTER — Ambulatory Visit
Admission: RE | Admit: 2023-04-09 | Discharge: 2023-04-09 | Disposition: A | Discharge status: Home / Self Care | Payer: PPO | Source: Ambulatory Visit | Attending: Physician Assistant | Admitting: Physician Assistant

## 2023-04-09 DIAGNOSIS — Z1231 Encounter for screening mammogram for malignant neoplasm of breast: Secondary | ICD-10-CM | POA: Diagnosis not present

## 2023-04-09 DIAGNOSIS — Z0000 Encounter for general adult medical examination without abnormal findings: Secondary | ICD-10-CM

## 2023-04-23 ENCOUNTER — Other Ambulatory Visit: Payer: Self-pay | Admitting: Internal Medicine

## 2023-06-22 ENCOUNTER — Ambulatory Visit: Payer: PPO | Admitting: Internal Medicine

## 2023-06-22 NOTE — Progress Notes (Deleted)
 Patient ID: CASTELLA LERNER, female   DOB: 13-Apr-1951, 73 y.o.   MRN: [id number]  HPI: UNIQUA KIHN is a 73 y.o.-year-old female, returning for f/u for DM2, dx in 2000, insulin -dependent since 2012, uncontrolled, with complications (DR).  Last visit 4 months ago.  Interim history: No increased urination, blurry vision, nausea, chest pain.  She joined the gym and also goes in a sauna 3x a week. She was able to start a freestyle libre CGM since last visit  Reviewed HbA1c levels: Lab Results  Component Value Date   HGBA1C 5.8 12/16/2022   HGBA1C 6.2 (A) 08/11/2022   HGBA1C 7.8 (A) 04/01/2022   HGBA1C 7.0 (A) 09/27/2021   HGBA1C 7.9 (A) 05/17/2021   HGBA1C 7.4 (H) 11/09/2020   HGBA1C 7.6 (A) 08/27/2020   HGBA1C 7.3 (A) 04/18/2020   HGBA1C 7.7 (H) 11/24/2019   HGBA1C 7.6 (H) 03/02/2019   HGBA1C 6.7 (A) 06/10/2018   HGBA1C 6.9 (A) 02/25/2018   HGBA1C 8.6 (A) 10/22/2017   HGBA1C 8.4 07/24/2017   HGBA1C 9.4 04/23/2017   HGBA1C 8.7 11/21/2016   HGBA1C 8.5 08/22/2016   HGBA1C 9.3 04/09/2016   HGBA1C 9.3 10/30/2015   HGBA1C 8.6 07/18/2015   She is on: - Lantus   40 units 2x a day >> .SABRASABRA 40 units in am >> 30-(40 ) >> 25 units in am - Jardiance  25 mg before breakfast >> restarted 11/2020 - Mounjaro  7.5 >> 10 mg weekly She stopped  Victoza  b/c $$$. Trulicity  not covered. She was on Jardiance  25 mg daily before b'fast. - added 03/2016 - stopped 06/2016  >> sugars increased >> at last visit, I advised her to restart >> did not do so as this was expensive. She tried Metformin  ER 500 mg >> still N/D/AP. She was previously on Basaglar  45 units daily.  She was also previously on Humalog  20 to 25 units before lunch and dinner. She was previously on Ozempic  -started 04/2020, then off, then restarted 11/2020.  She could not continue due to price.  Pt checks her sugars 3-4 times a day:  Previously: - am: 105-128, 175, 180  >> 104-110, 158 >> 95-110 >> 96-111 >> 97-109 - 2h after b'fast:  152  >>  n/c >> 267 >> n/c - before lunch: 130-135 >> 130 (w/o Humalog ) >> 180 >> n/c - 2h after lunch:  130-165, 180 >> 161-201 >> n/c >> 140-160 >> 160 - before dinner:  73, 110-173 >> n/c >> 125-132 >> n/c  - 2h after dinner: 180-189, 250 >> 180-230 >> 160-190, 240 >> 159-200 - bedtime: 175-200 >> 173, 180 >> 160s >> n/c - nighttime: 65 >> n/c >> 240-285 >> n/c >> 69 >> 78 >> n/c Lowest sugar was 69 >> 78 >> 97; she has hypoglycemia awareness in the 70s. Highest sugar was 285 ...>> 200.  Glucometer: AccuChek  She does intermittent fasting. Meals: - brunch: grits + meat + 2 eggs - +/- snack tuna, boiled egg - dinner: meat + 3 veggies  -No CKD, last BUN/creatinine:  Lab Results  Component Value Date   BUN 12 11/26/2022   CREATININE 0.69 11/26/2022   Lab Results  Component Value Date   MICRALBCREAT 1.9 11/26/2022   MICRALBCREAT 10 11/09/2020   MICRALBCREAT 1.8 11/24/2019   MICRALBCREAT 2.4 03/02/2019   MICRALBCREAT 7.6 01/29/2017  On losartan .  -+ h/o HL; last set of lipids: Lab Results  Component Value Date   CHOL 110 11/26/2022   HDL 43.00 11/26/2022   LDLCALC  52 11/26/2022   TRIG 75.0 11/26/2022   CHOLHDL 3 11/26/2022  Not on a statin.  She did not start Lipitor 40 mg daily as advised. 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 Dr. [facility]  She also sees a retina specialist - Dr. Raj.  -She denies numbness and tingling in her feet.  Last foot exam 08/11/2022.  She also has GERD, HTN. She has cardiomyopathy with a lower ejection fraction: 45-50%. She had a heart cath 09/13/2021 >> no blockages.   ROS: + see HPI  I reviewed pt's medications, allergies, PMH, social hx, family hx, and changes were documented in the history of present illness. Otherwise, unchanged from my initial visit note.  Past Medical History:  Diagnosis Date   Colon polyps    Diabetes mellitus    Glaucoma    History of chicken pox     HTN (hypertension)    Meralgia paresthetica    Past Surgical History:  Procedure Laterality Date   BREAST CYST EXCISION Left    patient states btw [phone]   CHOLECYSTECTOMY  06/16/2008   LEFT HEART CATH AND CORONARY ANGIOGRAPHY N/A 09/13/2021   Procedure: LEFT HEART CATH AND CORONARY ANGIOGRAPHY;  Surgeon: Wendel Lurena POUR, MD;  Location: [facility];  Service: Cardiovascular;  Laterality: N/A;   TONSILLECTOMY AND ADENOIDECTOMY     Social History   Social History   Marital Status: Widowed    Spouse Name: N/A   Number of Children: 1   Occupational History      Social History Main Topics   Smoking status: Never Smoker    Smokeless tobacco: Not on file   Alcohol Use: No   Drug Use: No   Current Outpatient Medications on File Prior to Visit  Medication Sig Dispense Refill   Ascorbic Acid (VITAMIN C) 1000 MG tablet Take 2,000 mg by mouth daily.     aspirin  EC 81 MG tablet Take 81 mg by mouth daily. Swallow whole.     B Complex-C (SUPER B COMPLEX PO) Take 2 capsules by mouth daily.     Blood Glucose Monitoring Suppl (ONE TOUCH ULTRA 2) w/Device KIT Use to check blood sugar 3 times daily 1 kit 0   Cholecalciferol (VITAMIN D3) 50 MCG (2000 UT) CAPS Take 15,000 Units by mouth daily. otc     Continuous Glucose Receiver (FREESTYLE LIBRE 3 READER) DEVI Use to monitor glucose continuously 1 each 0   Continuous Glucose Sensor (FREESTYLE LIBRE 3 PLUS SENSOR) MISC Inject 1 Device into the skin continuous. Change sensor every 15 days, use to monitor glucose continuously 6 each 3   Dulaglutide  (TRULICITY ) 3 MG/0.5ML SOPN Inject 3 mg into the skin once a week. 6 mL 3   empagliflozin  (JARDIANCE ) 25 MG TABS tablet Take 1 tablet (25 mg total) by mouth daily. TAKE 1 TABLET BY MOUTH EVERY DAY BEFORE BREAKFAST 90 tablet 2   glucose blood (ONETOUCH ULTRA) test strip USE AS INSTRUCTED TO CHECK  SUGAR 3 TIMES DAILY 300 strip 4   insulin  glargine (LANTUS  SOLOSTAR) 100 UNIT/ML Solostar Pen  INJECT 25 UNITS INTO THE SKIN DAILY. 22.5 mL 2   Insulin  Pen Needle 32G X 4 MM MISC Use 4x a day 400 each 3   KRILL OIL PO Take by mouth daily.     Lancets (ONETOUCH DELICA PLUS LANCET33G) MISC TEST 3 TIMES DAILY 300 each 1   losartan  (COZAAR ) 100 MG tablet Take 1  tablet (100 mg total) by mouth daily. 90 tablet 3   magnesium  oxide (MAG-OX) 400 (240 Mg) MG tablet Take 1 tablet by mouth at bedtime.     Multiple Vitamin (MULTIVITAMIN) tablet Take 1 tablet by mouth daily.     tirzepatide  (MOUNJARO ) 10 MG/0.5ML Pen Inject 10 mg into the skin once a week. 6 mL 1   No current facility-administered medications on file prior to visit.  LMP  (LMP Unknown)  Allergies  Allergen Reactions   Humalog  [Insulin  Lispro] Other (See Comments)    Paresthesia - hands   Latex Hives    itching   Family History  Problem Relation Age of Onset   Hypertension Mother    Diabetes Mother    Thyroid  disease Mother    Diabetes Father    Cancer Father        Colon cancer   Hypertension Father    Diabetes Sister    Diabetes Sister    Diabetes Sister    Diabetes Sister    PE:  LMP  (LMP Unknown)   Wt Readings from Last 3 Encounters:  12/16/22 156 lb 3.2 oz (70.9 kg)  11/26/22 155 lb (70.3 kg)  08/11/22 163 lb 6.4 oz (74.1 kg)   Constitutional: overweight, in NAD Eyes: EOMI, no exophthalmos ENT: no thyromegaly, no cervical lymphadenopathy Cardiovascular: RRR, No MRG Respiratory: CTA B Musculoskeletal: no deformities Skin: + Macular darkened discoloration on bilateral legs Neurological: + Mild tremor with outstretched hands  ASSESSMENT: 1. DM2, insulin -dependent, uncontrolled, with complications - DR  No family history of medullary thyroid  cancer or personal history of pancreatitis.  2. Obesity class 1  PLAN:  1. Patient with longstanding, uncontrolled, type 2 diabetes, on SGLT2 inhibitor, basal insulin  and weekly GLP-1/GIP receptor agonist, with improved control lately.  HbA1c at last visit was  lower, at 5.8%.  Sugars were all at goal.  At that time, she was in the donut hole and could not afford Mounjaro  so we discussed about using Trulicity .  Of note, she was previously on Ozempic  but had to come off due to rash (plaque psoriasis).  However, since last visit, she decided to go back to Mounjaro  and pay out-of-pocket for it if needed (of note, a PA for the 10 mg dose was approved 09/2022).   - I suggested to:  Patient Instructions  Please continue: - Lantus  25 units daily  - Jardiance  25 mg before breakfast - Mounjaro  10 mg weekly  Please return in 4-6 months.  - we checked her HbA1c: 7%  - advised to check sugars at different times of the day - 4x a day, rotating check times - advised for yearly eye exams >> she is UTD - return to clinic in 3-4 months   2. Obesity class 1 -Previously doing intermittent fasting, then trying a plant-based diet but ending up with greens and increase instead. -She started to go to the gym after retirement. -At last visit Mounjaro  was expensive while in the donut hole and we decided to try Trulicity .  However, she is now back on Mounjaro  10 mg weekly, which should also help with weight loss.   -Since last visit, **  Lela Fendt, MD PhD [facility] Endocrinology

## 2023-06-25 ENCOUNTER — Encounter: Payer: Self-pay | Admitting: Internal Medicine

## 2023-06-25 ENCOUNTER — Ambulatory Visit: Payer: Medicare Other | Admitting: Internal Medicine

## 2023-06-25 VITALS — BP 124/80 | HR 53 | Ht 60.5 in | Wt 155.8 lb

## 2023-06-25 DIAGNOSIS — Z794 Long term (current) use of insulin: Secondary | ICD-10-CM

## 2023-06-25 DIAGNOSIS — E663 Overweight: Secondary | ICD-10-CM

## 2023-06-25 DIAGNOSIS — E11319 Type 2 diabetes mellitus with unspecified diabetic retinopathy without macular edema: Secondary | ICD-10-CM

## 2023-06-25 LAB — POCT GLYCOSYLATED HEMOGLOBIN (HGB A1C): Hemoglobin A1C: 6 % — AB (ref 4.0–5.6)

## 2023-06-25 MED ORDER — FREESTYLE LIBRE 3 READER DEVI: 0 refills | Status: DC

## 2023-06-25 MED ORDER — TIRZEPATIDE 10 MG/0.5ML ~~LOC~~ SOAJ
10.0000 mg | SUBCUTANEOUS | 2 refills | Status: DC
Start: 2023-06-25 — End: 2023-07-03

## 2023-06-25 MED ORDER — FREESTYLE LIBRE 3 PLUS SENSOR MISC: 1.0000 | 3 refills | Status: DC

## 2023-06-25 NOTE — Progress Notes (Signed)
 Pelvic patient ID: Kristen [name], female   DOB: Aug 26, 1950, 73 y.o.   MRN: [id number] This note was precharted on 06/22/2023.  HPI: Kristen [name] is a 73 y.o.-year-old female, returning for f/u for DM2, dx in 2000, insulin -dependent since 2012, uncontrolled, with complications (DR).  Last visit 4 months ago.  Interim history: No increased urination, blurry vision, nausea, chest pain.  She was not able to start a freestyle libre CGM since last visit.  Reviewed HbA1c levels: Lab Results  Component Value Date   HGBA1C 5.8 12/16/2022   HGBA1C 6.2 (A) 08/11/2022   HGBA1C 7.8 (A) 04/01/2022   HGBA1C 7.0 (A) 09/27/2021   HGBA1C 7.9 (A) 05/17/2021   HGBA1C 7.4 (H) 11/09/2020   HGBA1C 7.6 (A) 08/27/2020   HGBA1C 7.3 (A) 04/18/2020   HGBA1C 7.7 (H) 11/24/2019   HGBA1C 7.6 (H) 03/02/2019   HGBA1C 6.7 (A) 06/10/2018   HGBA1C 6.9 (A) 02/25/2018   HGBA1C 8.6 (A) 10/22/2017   HGBA1C 8.4 07/24/2017   HGBA1C 9.4 04/23/2017   HGBA1C 8.7 11/21/2016   HGBA1C 8.5 08/22/2016   HGBA1C 9.3 04/09/2016   HGBA1C 9.3 10/30/2015   HGBA1C 8.6 07/18/2015   She is on: - Lantus   40 units 2x a day >> .SABRASABRA 40 units in am >> 30-(40 ) >> 25 units in am - Jardiance  25 mg before breakfast >> restarted 11/2020 - Mounjaro  7.5 >> 10 mg weekly She stopped  Victoza  b/c $$$. Trulicity  not covered. She was on Jardiance  25 mg daily before b'fast. - added 03/2016 - stopped 06/2016  >> sugars increased >> at last visit, I advised her to restart >> did not do so as this was expensive. She tried Metformin  ER 500 mg >> still N/D/AP. She was previously on Basaglar  45 units daily.  She was also previously on Humalog  20 to 25 units before lunch and dinner. She was previously on Ozempic  -started 04/2020, then off, then restarted 11/2020.  She could not continue due to price.  Pt checks her sugars >4 times a day: - am: 104-110, 158 >> 95-110 >> 96-111 >> 97-109 >> 90s, 100-110 - 2h after b'fast:  152  >> n/c >> 267 >> n/c -  before lunch: 130-135 >> 130 (w/o Humalog ) >> 180 >> n/c - 2h after lunch: 161-201 >> n/c >> 140-160 >> 160 >> 140s - before dinner:  73, 110-173 >> n/c >> 125-132 >> n/c  - 2h after dinner:  160-190, 240 >> 159-200 >> 167-213 - bedtime: 175-200 >> 173, 180 >> 160s >> n/c - nighttime: 240-285 >> n/c >> 69 >> 78 >> n/c Lowest sugar was 69 >> 78 >> 97 >> 90s; she has hypoglycemia awareness in the 70s. Highest sugar was 285 ...>> 200 >> 213.  Glucometer: AccuChek  She does intermittent fasting. Meals: - brunch: grits + meat + 2 eggs - +/- snack tuna, boiled egg - dinner: meat + 3 veggies  -No CKD, last BUN/creatinine:  Lab Results  Component Value Date   BUN 12 11/26/2022   CREATININE 0.69 11/26/2022   Lab Results  Component Value Date   MICRALBCREAT 1.9 11/26/2022   MICRALBCREAT 10 11/09/2020   MICRALBCREAT 1.8 11/24/2019   MICRALBCREAT 2.4 03/02/2019   MICRALBCREAT 7.6 01/29/2017  On losartan .  -+ h/o HL; last set of lipids: Lab Results  Component Value Date   CHOL 110 11/26/2022   HDL 43.00 11/26/2022   LDLCALC 52 11/26/2022   TRIG 75.0 11/26/2022   CHOLHDL 3 11/26/2022  Not on a statin.  She did not start Lipitor 40 mg daily as advised. 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 Dr. [facility]  She also sees a retina specialist - Dr. Raj.  -She denies numbness and tingling in her feet.  Last foot exam 08/11/2022.  She also has GERD, HTN. She has cardiomyopathy with a lower ejection fraction: 45-50%. She had a heart cath 09/13/2021 >> no blockages.   ROS: + see HPI  I reviewed pt's medications, allergies, PMH, social hx, family hx, and changes were documented in the history of present illness. Otherwise, unchanged from my initial visit note.  Past Medical History:  Diagnosis Date   Colon polyps    Diabetes mellitus    Glaucoma    History of chicken pox    HTN (hypertension)    Meralgia  paresthetica    Past Surgical History:  Procedure Laterality Date   BREAST CYST EXCISION Left    patient states btw [phone]   CHOLECYSTECTOMY  06/16/2008   LEFT HEART CATH AND CORONARY ANGIOGRAPHY N/A 09/13/2021   Procedure: LEFT HEART CATH AND CORONARY ANGIOGRAPHY;  Surgeon: Wendel Lurena POUR, MD;  Location: [facility];  Service: Cardiovascular;  Laterality: N/A;   TONSILLECTOMY AND ADENOIDECTOMY     Social History   Social History   Marital Status: Widowed    Spouse Name: N/A   Number of Children: 1   Occupational History      Social History Main Topics   Smoking status: Never Smoker    Smokeless tobacco: Not on file   Alcohol Use: No   Drug Use: No   Current Outpatient Medications on File Prior to Visit  Medication Sig Dispense Refill   Ascorbic Acid (VITAMIN C) 1000 MG tablet Take 2,000 mg by mouth daily.     aspirin  EC 81 MG tablet Take 81 mg by mouth daily. Swallow whole.     B Complex-C (SUPER B COMPLEX PO) Take 2 capsules by mouth daily.     Blood Glucose Monitoring Suppl (ONE TOUCH ULTRA 2) w/Device KIT Use to check blood sugar 3 times daily 1 kit 0   Cholecalciferol (VITAMIN D3) 50 MCG (2000 UT) CAPS Take 15,000 Units by mouth daily. otc     Continuous Glucose Receiver (FREESTYLE LIBRE 3 READER) DEVI Use to monitor glucose continuously 1 each 0   Continuous Glucose Sensor (FREESTYLE LIBRE 3 PLUS SENSOR) MISC Inject 1 Device into the skin continuous. Change sensor every 15 days, use to monitor glucose continuously 6 each 3   empagliflozin  (JARDIANCE ) 25 MG TABS tablet Take 1 tablet (25 mg total) by mouth daily. TAKE 1 TABLET BY MOUTH EVERY DAY BEFORE BREAKFAST 90 tablet 2   glucose blood (ONETOUCH ULTRA) test strip USE AS INSTRUCTED TO CHECK  SUGAR 3 TIMES DAILY 300 strip 4   insulin  glargine (LANTUS  SOLOSTAR) 100 UNIT/ML Solostar Pen INJECT 25 UNITS INTO THE SKIN DAILY. 22.5 mL 2   Insulin  Pen Needle 32G X 4 MM MISC Use 4x a day 400 each 3   KRILL OIL PO Take  by mouth daily.     Lancets (ONETOUCH DELICA PLUS LANCET33G) MISC TEST 3 TIMES DAILY 300 each 1   losartan  (COZAAR ) 100 MG tablet Take 1 tablet (100 mg total) by mouth daily. 90 tablet 3   magnesium  oxide (MAG-OX) 400 (240 Mg) MG tablet Take 1 tablet by mouth at bedtime.     Multiple  Vitamin (MULTIVITAMIN) tablet Take 1 tablet by mouth daily.     tirzepatide  (MOUNJARO ) 10 MG/0.5ML Pen Inject 10 mg into the skin once a week. 6 mL 1   No current facility-administered medications on file prior to visit.  LMP  (LMP Unknown)  Allergies  Allergen Reactions   Humalog  [Insulin  Lispro] Other (See Comments)    Paresthesia - hands   Latex Hives    itching   Family History  Problem Relation Age of Onset   Hypertension Mother    Diabetes Mother    Thyroid  disease Mother    Diabetes Father    Cancer Father        Colon cancer   Hypertension Father    Diabetes Sister    Diabetes Sister    Diabetes Sister    Diabetes Sister    PE:  BP 124/80   Pulse (!) 53   Ht 5' 0.5 (1.537 m)   Wt 155 lb 12.8 oz (70.7 kg)   LMP  (LMP Unknown)   SpO2 99%   BMI 29.93 kg/m   Wt Readings from Last 3 Encounters:  06/25/23 155 lb 12.8 oz (70.7 kg)  12/16/22 156 lb 3.2 oz (70.9 kg)  11/26/22 155 lb (70.3 kg)   Constitutional: overweight, in NAD Eyes: EOMI, no exophthalmos ENT: no thyromegaly, no cervical lymphadenopathy Cardiovascular: RRR, No MRG Respiratory: CTA B Musculoskeletal: no deformities Skin: + Macular darkened discoloration on bilateral legs Neurological: no tremor with outstretched hands Diabetic Foot Exam - Simple   Simple Foot Form Diabetic Foot exam was performed with the following findings: Yes 06/25/2023  9:58 AM  Visual Inspection No deformities, no ulcerations, no other skin breakdown bilaterally: Yes Sensation Testing Intact to touch and monofilament testing bilaterally: Yes Pulse Check Posterior Tibialis and Dorsalis pulse intact bilaterally: Yes Comments B hallux  valgus    ASSESSMENT: 1. DM2, insulin -dependent, uncontrolled, with complications - DR  No family history of medullary thyroid  cancer or personal history of pancreatitis.  2. Overweight  PLAN:  1. Patient with longstanding, uncontrolled, type 2 diabetes, on SGLT2 inhibitor, basal insulin  and weekly GLP-1/GIP receptor agonist, with improved control lately.  HbA1c at last visit was lower, at 5.8%.  Sugars were all at goal.  At that time, she was in the donut hole and could not afford Mounjaro  so we discussed about using Trulicity .  Of note, she was previously on Ozempic  but had to come off due to rash (plaque psoriasis).  However, since last visit, she decided to go back to Mounjaro  and pay out-of-pocket for it if needed (a PA for the 10 mg dose was approved 09/2022). -At today's visit, sugars are at goal in the morning and they are mostly at goal after dinner, even with the holidays.  Her HbA1c today is very slightly higher than before, still at goal.  Therefore, for now, I did not recommend a change in regimen.  She was not able to obtain the CGM since last visit as she mentions that the pharmacy did not receive it.  I sent another prescription to her other [facility] at this visit. - I suggested to:  Patient Instructions  Please continue: - Lantus  25 units daily  - Jardiance  25 mg before breakfast - Mounjaro  10 mg weekly  Please return in 4-6 months.  - we checked her HbA1c: 6.0% (higher) - advised to check sugars at different times of the day - 4x a day, rotating check times - advised for yearly eye exams >>  she is UTD - return to clinic in 4-6 months   2. Overweight -She tried intermittent fasting and a plant-based diet in the past. -She started to go to the gym after retirement -At last visit Mounjaro  was expensive while in the donut hole and we decided to try Trulicity .  However, she is now back on Mounjaro  10 mg daily which should also help with weight loss -Since last visit,  weight is approximately stable  Lela Fendt, MD PhD [facility]

## 2023-06-25 NOTE — Patient Instructions (Signed)
 Please continue: - Lantus 25 units daily  - Jardiance 25 mg before breakfast - Mounjaro 10 mg weekly  Please return in 4-6 months.

## 2023-06-30 ENCOUNTER — Telehealth: Payer: Self-pay

## 2023-06-30 ENCOUNTER — Other Ambulatory Visit (HOSPITAL_COMMUNITY): Payer: Self-pay

## 2023-06-30 NOTE — Telephone Encounter (Signed)
 Pharmacy Patient Advocate Encounter   Received notification from CoverMyMeds that prior authorization for Mounjaro  is required/requested.   Insurance verification completed.   The patient is insured through [facility] .   Per test claim: PA required; PA submitted to above mentioned insurance via CoverMyMeds Key/confirmation #/EOC [id number] Status is pending

## 2023-06-30 NOTE — Telephone Encounter (Signed)
 Pt called wanting to know can she switch from the Mounjaro 10 mg to [facility] 5 or 2.5 mg? She states she always has to go to [facility] to get her rx, since the pharmacy here never has it.

## 2023-06-30 NOTE — Telephone Encounter (Signed)
 Yes, sure, based on her sugars, we can go to 5 mg weekly.

## 2023-07-02 NOTE — Telephone Encounter (Signed)
Let's try Ozempic 1 mg weekly and samedose of  Jardiance but through patient assistance.

## 2023-07-02 NOTE — Telephone Encounter (Signed)
Pt cannot afford Mounjaro and Jardiance. Please advise on alternative?

## 2023-07-03 ENCOUNTER — Telehealth: Payer: Self-pay

## 2023-07-03 ENCOUNTER — Other Ambulatory Visit (HOSPITAL_COMMUNITY): Payer: Self-pay

## 2023-07-03 MED ORDER — TIRZEPATIDE 5 MG/0.5ML ~~LOC~~ SOAJ: 5.0000 mg | SUBCUTANEOUS | 3 refills | Status: DC

## 2023-07-03 NOTE — Telephone Encounter (Signed)
Pharmacy Patient Advocate Encounter  Received notification from [facility] that Prior Authorization for Kristen [name] has been DENIED.  See denial reason below. No denial letter attached in CMM. Will attach denial letter to Media tab once received.  Kristen [name] is approved through 06/15/2024 under your Medicare Part D benefit. However, it is denied for an exception in the cost-sharing tier.   PA #/Case ID/Reference #: [id number]  It looks like they are covering it, but won't lower the cost. I can't see what the cost would be at the moment, as test claim is showing too soon to refill.

## 2023-07-03 NOTE — Telephone Encounter (Signed)
Pt states she can not take Ozempic due to stomach issues in the past. Mounjaro 5 mg has been sent to the pharmacy.

## 2023-07-03 NOTE — Telephone Encounter (Signed)
PA needs to resubmitted for 5 mg. Dosage was changed.

## 2023-07-03 NOTE — Telephone Encounter (Signed)
Sample  Medication: Jardiance  Dose: 10 mg  Quantity: 4 boxes  OZH:[id number] EXP:06/2024  Dicie Beam

## 2023-07-21 NOTE — Telephone Encounter (Signed)
Samples put back in stock, samples never picked up.

## 2023-08-04 ENCOUNTER — Other Ambulatory Visit: Payer: Self-pay | Admitting: Internal Medicine

## 2023-08-04 DIAGNOSIS — E11319 Type 2 diabetes mellitus with unspecified diabetic retinopathy without macular edema: Secondary | ICD-10-CM

## 2023-08-05 ENCOUNTER — Telehealth: Payer: Self-pay

## 2023-08-05 ENCOUNTER — Other Ambulatory Visit (HOSPITAL_COMMUNITY): Payer: Self-pay

## 2023-08-05 NOTE — Telephone Encounter (Signed)
Pharmacy Patient Advocate Encounter   Received notification from Pt Calls Messages that prior authorization for Freestyle libre 3 reader is required/requested.   Insurance verification completed.   The patient is insured through [facility] .   Per test claim: Part B Claim. Please submit to DME or ask pharmacy to bill to part B

## 2023-08-05 NOTE — Telephone Encounter (Signed)
Pt needs a PA for [place] 3 Reader

## 2023-08-06 NOTE — Telephone Encounter (Signed)
I called and spoke with the patient. She states that she did not need this reader. The pharmacy send an automatic refill on 2/18 requesting it but she does not need it.

## 2023-08-25 ENCOUNTER — Ambulatory Visit
Admission: RE | Admit: 2023-08-25 | Discharge: 2023-08-25 | Disposition: A | Discharge status: Home / Self Care | Payer: PPO | Source: Ambulatory Visit | Attending: Physician Assistant | Admitting: Physician Assistant

## 2023-08-25 DIAGNOSIS — M8588 Other specified disorders of bone density and structure, other site: Secondary | ICD-10-CM | POA: Diagnosis not present

## 2023-08-26 ENCOUNTER — Encounter: Payer: Self-pay | Admitting: Physician Assistant

## 2023-08-27 DIAGNOSIS — E113312 Type 2 diabetes mellitus with moderate nonproliferative diabetic retinopathy with macular edema, left eye: Secondary | ICD-10-CM | POA: Diagnosis not present

## 2023-08-27 DIAGNOSIS — E113391 Type 2 diabetes mellitus with moderate nonproliferative diabetic retinopathy without macular edema, right eye: Secondary | ICD-10-CM | POA: Diagnosis not present

## 2023-08-27 DIAGNOSIS — H43391 Other vitreous opacities, right eye: Secondary | ICD-10-CM | POA: Diagnosis not present

## 2023-08-27 DIAGNOSIS — H35412 Lattice degeneration of retina, left eye: Secondary | ICD-10-CM | POA: Diagnosis not present

## 2023-08-27 DIAGNOSIS — H43813 Vitreous degeneration, bilateral: Secondary | ICD-10-CM | POA: Diagnosis not present

## 2023-08-27 DIAGNOSIS — H35033 Hypertensive retinopathy, bilateral: Secondary | ICD-10-CM | POA: Diagnosis not present

## 2023-09-23 ENCOUNTER — Telehealth: Payer: Self-pay | Admitting: Nutrition

## 2023-09-23 NOTE — Telephone Encounter (Signed)
 Left message on my machine.  Patient said she went to pick up her Jardiance and can not afford this.  It is over $100.00.  Please call her

## 2023-09-30 ENCOUNTER — Telehealth: Payer: Self-pay | Admitting: Nutrition

## 2023-09-30 NOTE — Telephone Encounter (Signed)
 LMTRC

## 2023-09-30 NOTE — Telephone Encounter (Signed)
 Message left on my machine that she is calling Jasmine back and wishes a call from her today

## 2023-09-30 NOTE — Telephone Encounter (Signed)
 Already spoke with pt

## 2023-10-01 ENCOUNTER — Telehealth: Payer: Self-pay | Admitting: Internal Medicine

## 2023-10-01 NOTE — Telephone Encounter (Signed)
 Patient came in to office and completed her part of the "Chronic Condition Verification Form".  The form is in Dr. Lavina Pou folder in the front office.

## 2023-10-05 NOTE — Telephone Encounter (Signed)
 Faxed

## 2023-10-10 ENCOUNTER — Other Ambulatory Visit: Payer: Self-pay | Admitting: Internal Medicine

## 2023-10-28 DIAGNOSIS — H35033 Hypertensive retinopathy, bilateral: Secondary | ICD-10-CM | POA: Diagnosis not present

## 2023-10-28 DIAGNOSIS — E113393 Type 2 diabetes mellitus with moderate nonproliferative diabetic retinopathy without macular edema, bilateral: Secondary | ICD-10-CM | POA: Diagnosis not present

## 2023-10-28 DIAGNOSIS — H25813 Combined forms of age-related cataract, bilateral: Secondary | ICD-10-CM | POA: Diagnosis not present

## 2023-10-28 LAB — HM DIABETES EYE EXAM

## 2023-11-05 ENCOUNTER — Telehealth: Payer: Self-pay

## 2023-11-05 MED ORDER — LANTUS SOLOSTAR 100 UNIT/ML ~~LOC~~ SOPN: PEN_INJECTOR | SUBCUTANEOUS | Status: DC

## 2023-11-05 NOTE — Telephone Encounter (Signed)
 Lantus  Sample has been labeled placed in fridge.  Also document sample in epic.  [employer] application placed up front.  Jasmine has made patient aware

## 2023-11-27 NOTE — Telephone Encounter (Signed)
 [employer] Application is in Dr. Lavina Pou folder in the front office.

## 2023-11-27 NOTE — Telephone Encounter (Signed)
 Patient picked up Lantus  sample - patient completing application in office

## 2023-12-09 ENCOUNTER — Ambulatory Visit (INDEPENDENT_AMBULATORY_CARE_PROVIDER_SITE_OTHER)

## 2023-12-09 VITALS — Ht 61.0 in | Wt 155.0 lb

## 2023-12-09 DIAGNOSIS — Z0000 Encounter for general adult medical examination without abnormal findings: Secondary | ICD-10-CM | POA: Diagnosis not present

## 2023-12-09 NOTE — Progress Notes (Addendum)
 Subjective:   Kristen [name] is a 73 y.o. who presents for a Medicare Wellness preventive visit.  As a reminder, Annual Wellness Visits don't include a physical exam, and some assessments may be limited, especially if this visit is performed virtually. We may recommend an in-person follow-up visit with your provider if needed.  Visit Complete: Virtual I connected with  Kristen [name] on 12/14/23 by a video and audio enabled telemedicine application and verified that I am speaking with the correct person using two identifiers.  Patient Location: Home  Provider Location: Home Office  I discussed the limitations of evaluation and management by telemedicine. The patient expressed understanding and agreed to proceed.  Vital Signs: Because this visit was a virtual/telehealth visit, some criteria may be missing or patient reported. Any vitals not documented were not able to be obtained and vitals that have been documented are patient reported.    Persons Participating in Visit: Patient.  AWV Questionnaire: No: Patient Medicare AWV questionnaire was not completed prior to this visit.  Cardiac Risk Factors include: advanced age (>29men, >46 women);diabetes mellitus;hypertension     Objective:    Today's Vitals   12/09/23 1005  Weight: 155 lb (70.3 kg)  Height: 5' 1 (1.549 m)   Body mass index is 29.29 kg/m.     12/09/2023   10:14 AM 09/13/2021    6:08 AM  Advanced Directives  Does Patient Have a Medical Advance Directive? No No  Would patient like information on creating a medical advance directive? No - Patient declined No - Patient declined    Current Medications (verified) Outpatient Encounter Medications as of 12/09/2023  Medication Sig   Ascorbic Acid (VITAMIN C) 1000 MG tablet Take 2,000 mg by mouth daily.   aspirin  EC 81 MG tablet Take 81 mg by mouth daily. Swallow whole.   B Complex-C (SUPER B COMPLEX PO) Take 2 capsules by mouth daily.   Blood Glucose Monitoring Suppl  (ONE TOUCH ULTRA 2) w/Device KIT Use to check blood sugar 3 times daily   Cholecalciferol (VITAMIN D3) 50 MCG (2000 UT) CAPS Take 15,000 Units by mouth daily. otc   Continuous Glucose Receiver (FREESTYLE LIBRE 3 READER) DEVI USE TO MONITOR GLUCOSE CONTINUOUSLY   Continuous Glucose Sensor (FREESTYLE LIBRE 3 PLUS SENSOR) MISC Inject 1 Device into the skin continuous. Change sensor every 15 days, use to monitor glucose continuously   glucose blood (ONETOUCH ULTRA) test strip USE AS INSTRUCTED TO CHECK  SUGAR 3 TIMES DAILY   insulin  glargine (LANTUS  SOLOSTAR) 100 UNIT/ML Solostar Pen Lot: [id number] Exp: 10/13/2025   Insulin  Pen Needle 32G X 4 MM MISC Use 4x a day   KRILL OIL PO Take by mouth daily.   Lancets (ONETOUCH DELICA PLUS LANCET33G) MISC TEST 3 TIMES DAILY   losartan  (COZAAR ) 100 MG tablet TAKE 1 TABLET BY MOUTH EVERY DAY   magnesium  oxide (MAG-OX) 400 (240 Mg) MG tablet Take 1 tablet by mouth at bedtime.   Multiple Vitamin (MULTIVITAMIN) tablet Take 1 tablet by mouth daily.   empagliflozin  (JARDIANCE ) 25 MG TABS tablet Take 1 tablet (25 mg total) by mouth daily. TAKE 1 TABLET BY MOUTH EVERY DAY BEFORE BREAKFAST (Patient not taking: Reported on 12/09/2023)   insulin  glargine (LANTUS  SOLOSTAR) 100 UNIT/ML Solostar Pen INJECT 25 UNITS INTO THE SKIN DAILY. (Patient not taking: Reported on 12/09/2023)   tirzepatide  (MOUNJARO ) 5 MG/0.5ML Pen Inject 5 mg into the skin once a week. (Patient not taking: Reported on 12/09/2023)   No  facility-administered encounter medications on file as of 12/09/2023.    Allergies (verified) Humalog  [insulin  lispro] and Latex   History: Past Medical History:  Diagnosis Date   Colon polyps    Diabetes mellitus    Glaucoma    History of chicken pox    HTN (hypertension)    Meralgia paresthetica    Past Surgical History:  Procedure Laterality Date   BREAST CYST EXCISION Left    patient states btw [phone]   CHOLECYSTECTOMY  06/16/2008   LEFT HEART CATH AND  CORONARY ANGIOGRAPHY N/A 09/13/2021   Procedure: LEFT HEART CATH AND CORONARY ANGIOGRAPHY;  Surgeon: Wendel Lurena POUR, MD;  Location: [facility];  Service: Cardiovascular;  Laterality: N/A;   TONSILLECTOMY AND ADENOIDECTOMY     Family History  Problem Relation Age of Onset   Hypertension Mother    Diabetes Mother    Thyroid  disease Mother    Diabetes Father    Cancer Father        Colon cancer   Hypertension Father    Diabetes Sister    Diabetes Sister    Diabetes Sister    Diabetes Sister    Social History   Socioeconomic History   Marital status: Widowed    Spouse name: Not on file   Number of children: 4   Years of education: Not on file   Highest education level: Some college, no degree  Occupational History   Occupation: Systems analyst: [employer]    Comment: Social worker  Tobacco Use   Smoking status: Former    Current packs/day: 0.00    Average packs/day: 0.8 packs/day for 4.0 years (3.0 ttl pk-yrs)    Types: Cigarettes    Start date: 69    Quit date: 62    Years since quitting: 50.5    Passive exposure: Never   Smokeless tobacco: Never  Vaping Use   Vaping status: Never Used  Substance and Sexual Activity   Alcohol use: No   Drug use: No   Sexual activity: Yes    Birth control/protection: None    Comment: number of sex partners in the last 12 months  0  Other Topics Concern   Not on file  Social History Narrative   Lives alone, though her son stays with her as needed.   4 adult children, 1 biological, 3 adopted.   Social Drivers of Health   Financial Resource Strain: Medium Risk (12/09/2023)   Overall Financial Resource Strain (CARDIA)    Difficulty of Paying Living Expenses: Somewhat hard  Food Insecurity: No Food Insecurity (12/09/2023)   Hunger Vital Sign    Worried About Running Out of Food in the Last Year: Never true    Ran Out of Food in the Last Year: Never true  Transportation Needs: No Transportation Needs  (12/09/2023)   PRAPARE - Administrator, Civil Service (Medical): No    Lack of Transportation (Non-Medical): No  Physical Activity: Sufficiently Active (12/09/2023)   Exercise Vital Sign    Days of Exercise per Week: 3 days    Minutes of Exercise per Session: 90 min  Stress: No Stress Concern Present (12/09/2023)   [employer] of Occupational Health - Occupational Stress Questionnaire    Feeling of Stress: Not at all  Social Connections: Moderately Integrated (12/09/2023)   Social Connection and Isolation Panel    Frequency of Communication with Friends and Family: More than three times a week  Frequency of Social Gatherings with Friends and Family: More than three times a week    Attends Religious Services: More than 4 times per year    Active Member of Clubs or Organizations: Yes    Attends Banker Meetings: 1 to 4 times per year    Marital Status: Widowed    Tobacco Counseling Counseling given: Not Answered    Clinical Intake:  Pre-visit preparation completed: Yes  Pain : No/denies pain     BMI - recorded: 29.29 Nutritional Status: BMI 25 -29 Overweight Diabetes: Yes CBG done?: Yes (192 per pt) CBG resulted in Enter/ Edit results?: No Did pt. bring in CBG monitor from home?: No  Lab Results  Component Value Date   HGBA1C 6.0 (A) 06/25/2023   HGBA1C 5.8 12/16/2022   HGBA1C 6.2 (A) 08/11/2022     How often do you need to have someone help you when you read instructions, pamphlets, or other written materials from your doctor or pharmacy?: 1 - Never  Interpreter Needed?: No  Information entered by :: Ellouise Haws, LPN   Activities of Daily Living     12/09/2023   10:11 AM  In your present state of health, do you have any difficulty performing the following activities:  Hearing? 0  Vision? 0  Difficulty concentrating or making decisions? 0  Walking or climbing stairs? 0  Dressing or bathing? 0  Doing errands, shopping? 0   Preparing Food and eating ? N  Using the Toilet? N  In the past six months, have you accidently leaked urine? N  Do you have problems with loss of bowel control? N  Managing your Medications? N  Managing your Finances? N  Housekeeping or managing your Housekeeping? N    Patient Care Team: Job Lukes, [place] as PCP - General (Physician Assistant) Thukkani, Arun K, MD as PCP - Cardiology (Cardiology) Trixie File, MD as Consulting Physician (Internal Medicine) Rockney Longs, MD as Referring Physician (Optometry)  I have updated your Care Teams any recent Medical Services you may have received from other providers in the past year.     Assessment:   This is a routine wellness examination for Carroll.  Hearing/Vision screen Hearing Screening - Comments:: Pt denies any hearing issues  Vision Screening - Comments:: Wears rx glasses - up to date with routine eye exams with Dr Marcey and Dr Thelbert retina specialist    Goals Addressed             This Visit's Progress    Patient Stated       [facility] back [facility]        Depression Screen     12/09/2023   10:12 AM 11/26/2022   11:22 AM 07/18/2021    3:37 PM 04/04/2021    3:28 PM 11/24/2019    2:31 PM 03/02/2019    1:28 PM 09/26/2017    8:09 AM  PHQ 2/9 Scores  PHQ - 2 Score 0 0 0 0 0 0 0    Fall Risk     12/09/2023   10:15 AM 11/26/2022   11:22 AM 07/18/2021    3:37 PM 04/04/2021    3:28 PM 11/24/2019    2:31 PM  Fall Risk   Falls in the past year? 0 0 0 0 0  Number falls in past yr: 0 0     Injury with Fall? 0 0     Risk for fall due to : No Fall Risks  No Fall Risks  Follow up Falls prevention discussed Falls evaluation completed       MEDICARE RISK AT HOME:  Medicare Risk at Home Any stairs in or around the home?: No If so, are there any without handrails?: No Home free of loose throw rugs in walkways, pet beds, electrical cords, etc?: Yes Adequate lighting in your home to reduce risk of falls?: Yes Life  alert?: No Use of a cane, walker or w/c?: No Grab bars in the bathroom?: No Shower chair or bench in shower?: No Elevated toilet seat or a handicapped toilet?: No  TIMED UP AND GO:  Was the test performed?  No  Cognitive Function: 6CIT completed        12/09/2023   10:15 AM  6CIT Screen  What Year? 0 points  What month? 0 points  What time? 0 points  Count back from 20 0 points  Months in reverse 0 points  Repeat phrase 0 points  Total Score 0 points    Immunizations Immunization History  Administered Date(s) Administered   PFIZER(Purple Top)SARS-COV-2 Vaccination 07/30/2019, 08/24/2019, 04/02/2020   Tdap 09/08/2012, 06/11/2023    Screening Tests Health Maintenance  Topic Date Due   Diabetic kidney evaluation - Urine ACR  11/09/2021   Diabetic kidney evaluation - eGFR measurement  11/26/2023   HEMOGLOBIN A1C  12/23/2023   INFLUENZA VACCINE  01/15/2024   FOOT EXAM  06/24/2024   OPHTHALMOLOGY EXAM  10/27/2024   Medicare Annual Wellness (AWV)  12/08/2024   Colonoscopy  01/26/2025   MAMMOGRAM  04/08/2025   DEXA SCAN  08/25/2026   DTaP/Tdap/Td (3 - Td or Tdap) 06/10/2033   Hepatitis C Screening  Completed   Hepatitis B Vaccines  Aged Out   HPV VACCINES  Aged Out   Meningococcal B Vaccine  Aged Out   Pneumococcal Vaccine: 50+ Years  Discontinued   COVID-19 Vaccine  Discontinued   Zoster Vaccines- Shingrix  Discontinued    Health Maintenance  Health Maintenance Due  Topic Date Due   Diabetic kidney evaluation - Urine ACR  11/09/2021   Diabetic kidney evaluation - eGFR measurement  11/26/2023   Health Maintenance Items Addressed: See Nurse Notes at the end of this note  Additional Screening:  Vision Screening: Recommended annual ophthalmology exams for early detection of glaucoma and other disorders of the eye. Would you like a referral to an eye doctor? No    Dental Screening: Recommended annual dental exams for proper oral hygiene  Community Resource  Referral / Chronic Care Management: CRR required this visit?  No   CCM required this visit?  No   Plan:    I have personally reviewed and noted the following in the patient's chart:   Medical and social history Use of alcohol, tobacco or illicit drugs  Current medications and supplements including opioid prescriptions. Patient is not currently taking opioid prescriptions. Functional ability and status Nutritional status Physical activity Advanced directives List of other physicians Hospitalizations, surgeries, and ER visits in previous 12 months Vitals Screenings to include cognitive, depression, and falls Referrals and appointments  In addition, I have reviewed and discussed with patient certain preventive protocols, quality metrics, and best practice recommendations. A written personalized care plan for preventive services as well as general preventive health recommendations were provided to patient.   Ellouise VEAR Haws, LPN   [id number]   After Visit Summary: (MyChart) Due to this being a telephonic visit, the after visit summary with patients personalized plan was offered to patient  via MyChart   Notes: Nothing significant to report at this time.

## 2023-12-09 NOTE — Patient Instructions (Signed)
 Kristen [name] , Thank you for taking time out of your busy schedule to complete your Annual Wellness Visit with me. I enjoyed our conversation and look forward to speaking with you again next year. I, as well as your care team,  appreciate your ongoing commitment to your health goals. Please review the following plan we discussed and let me know if I can assist you in the future. Your Game plan/ To Do List    Referrals: If you haven't heard from the office you've been referred to, please reach out to them at the phone provided.   Follow up Visits: Next Medicare AWV with our clinical staff: 12/15/24   Have you seen your provider in the last 6 months (3 months if uncontrolled diabetes)? Yes Next Office Visit with your provider: 12/15/23  Clinician Recommendations:  Aim for 30 minutes of exercise or brisk walking, 6-8 glasses of water, and 5 servings of fruits and vegetables each day.       This is a list of the screening recommended for you and due dates:  Health Maintenance  Topic Date Due   Medicare Annual Wellness Visit  09/02/2018   DTaP/Tdap/Td vaccine (2 - Td or Tdap) 09/09/2022   Yearly kidney function blood test for diabetes  11/26/2023   Yearly kidney health urinalysis for diabetes  11/26/2023   Hemoglobin A1C  12/23/2023   Flu Shot  01/15/2024   Complete foot exam   06/24/2024   Eye exam for diabetics  10/27/2024   Colon Cancer Screening  01/26/2025   Mammogram  04/08/2025   DEXA scan (bone density measurement)  08/25/2026   Hepatitis C Screening  Completed   Hepatitis B Vaccine  Aged Out   HPV Vaccine  Aged Out   Meningitis B Vaccine  Aged Out   Pneumococcal Vaccine for age over 25  Discontinued   COVID-19 Vaccine  Discontinued   Zoster (Shingles) Vaccine  Discontinued    Advanced directives: (Declined) Advance directive discussed with you today. Even though you declined this today, please call our office should you change your mind, and we can give you the proper paperwork for  you to fill out. Advance Care Planning is important because it:  [x]  Makes sure you receive the medical care that is consistent with your values, goals, and preferences  [x]  It provides guidance to your family and loved ones and reduces their decisional burden about whether or not they are making the right decisions based on your wishes.  Follow the link provided in your after visit summary or read over the paperwork we have mailed to you to help you started getting your Advance Directives in place. If you need assistance in completing these, please reach out to us  so that we can help you!  See attachments for Preventive Care and Fall Prevention Tips.

## 2023-12-15 ENCOUNTER — Encounter: Admitting: Physician Assistant

## 2023-12-15 ENCOUNTER — Ambulatory Visit

## 2023-12-16 ENCOUNTER — Telehealth: Payer: Self-pay

## 2023-12-16 NOTE — Telephone Encounter (Signed)
 Patient Assistance  Medication:Lantus   Dosage:U100 Quantity:2 boxes  Date received:12/16/23    *Pt has been notified*

## 2023-12-21 ENCOUNTER — Telehealth: Payer: Self-pay

## 2023-12-21 ENCOUNTER — Other Ambulatory Visit (HOSPITAL_COMMUNITY): Payer: Self-pay

## 2023-12-21 NOTE — Telephone Encounter (Signed)
 Pharmacy Patient Advocate Encounter   Received notification from CoverMyMeds that prior authorization for Freestyle libre 3 plus is required/requested.   Insurance verification completed.   The patient is insured through [facility] .   Per test claim: PA required; PA submitted to above mentioned insurance via CoverMyMeds Key/confirmation #/EOC [id number] Status is pending

## 2023-12-23 ENCOUNTER — Encounter: Payer: Self-pay | Admitting: Internal Medicine

## 2023-12-23 ENCOUNTER — Ambulatory Visit: Payer: Medicare Other | Admitting: Internal Medicine

## 2023-12-23 VITALS — BP 122/80 | HR 55 | Ht 61.0 in | Wt 156.8 lb

## 2023-12-23 DIAGNOSIS — E663 Overweight: Secondary | ICD-10-CM | POA: Diagnosis not present

## 2023-12-23 DIAGNOSIS — E11319 Type 2 diabetes mellitus with unspecified diabetic retinopathy without macular edema: Secondary | ICD-10-CM

## 2023-12-23 DIAGNOSIS — E66811 Obesity, class 1: Secondary | ICD-10-CM | POA: Diagnosis not present

## 2023-12-23 DIAGNOSIS — Z794 Long term (current) use of insulin: Secondary | ICD-10-CM | POA: Diagnosis not present

## 2023-12-23 LAB — POCT GLYCOSYLATED HEMOGLOBIN (HGB A1C): Hemoglobin A1C: 8 % — AB (ref 4.0–5.6)

## 2023-12-23 MED ORDER — DAPAGLIFLOZIN PROPANEDIOL 10 MG PO TABS: 10.0000 mg | ORAL_TABLET | Freq: Every day | ORAL | 3 refills | Status: DC

## 2023-12-23 NOTE — Progress Notes (Signed)
 Pelvic patient ID: LAVONDA THAL, female   DOB: May 07, 1951, 73 y.o.   MRN: [id number]  HPI: NICK ARMEL is a 73 y.o.-year-old female, returning for f/u for DM2, dx in 2000, insulin -dependent since 2012, uncontrolled, with complications (DR).  Last visit 6 months ago.  Interim history: No increased urination, blurry vision, nausea, chest pain.  She is off all her DM meds for the last 1.5 mo 2/2 $. Now she qualified for PAP - just received Lantus  and plans to start today.  Reviewed HbA1c levels: Lab Results  Component Value Date   HGBA1C 6.0 (A) 06/25/2023   HGBA1C 5.8 12/16/2022   HGBA1C 6.2 (A) 08/11/2022   HGBA1C 7.8 (A) 04/01/2022   HGBA1C 7.0 (A) 09/27/2021   HGBA1C 7.9 (A) 05/17/2021   HGBA1C 7.4 (H) 11/09/2020   HGBA1C 7.6 (A) 08/27/2020   HGBA1C 7.3 (A) 04/18/2020   HGBA1C 7.7 (H) 11/24/2019   HGBA1C 7.6 (H) 03/02/2019   HGBA1C 6.7 (A) 06/10/2018   HGBA1C 6.9 (A) 02/25/2018   HGBA1C 8.6 (A) 10/22/2017   HGBA1C 8.4 07/24/2017   HGBA1C 9.4 04/23/2017   HGBA1C 8.7 11/21/2016   HGBA1C 8.5 08/22/2016   HGBA1C 9.3 04/09/2016   HGBA1C 9.3 10/30/2015   She is on: - Lantus   40 units 2x a day >> .SABRASABRA 40 units in am >> 30-(40 ) >> 25 units in am >> off 2/2 $ for last 1.5 mo - Jardiance  25 mg before breakfast >> restarted 11/2020 >> off 2/2 $ Mounjaro  7.5 >> 10 >> 5 mg weekly >> off 2/2 $ She stopped  Victoza  b/c $$$. Trulicity  not covered. She was on Jardiance  25 mg daily before b'fast. - added 03/2016 - stopped 06/2016  >> sugars increased >> at last visit, I advised her to restart >> did not do so as this was expensive. She tried Metformin  ER 500 mg >> still N/D/AP. She was previously on Basaglar  45 units daily.  She was also previously on Humalog  20 to 25 units before lunch and dinner. She was previously on Ozempic  -started 04/2020, then off, then restarted 11/2020.  She could not continue due to price.  Pt checks her sugars >4 times a day: - am: 96-111 >> 97-109 >> 90s,  100-110 >> 158-178 - 2h after b'fast:  152  >> n/c >> 267 >> n/c - before lunch: 130-135 >> 130 >> 180 >> n/c - 2h after lunch: n/c >> 140-160 >> 160 >> 140s >> n/c - before dinner:  73, 110-173 >> n/c >> 125-132 >> n/c  - 2h after dinner: 159-200 >> 167-213 >> 200-250, 318 - bedtime: 175-200 >> 173, 180 >> 160s >> n/c - nighttime: 240-285 >> n/c >> 69 >> 78 >> n/c Lowest sugar was 69 >> 78 >> 97 >> 90s 158; she has hypoglycemia awareness in the 70s. Highest sugar was 285 ...>> 200 >> 213 >> 318  Glucometer: AccuChek  She does intermittent fasting. Meals: - brunch: grits + meat + 2 eggs - +/- snack tuna, boiled egg - dinner: meat + 3 veggies  -No CKD, last BUN/creatinine:  Lab Results  Component Value Date   BUN 12 11/26/2022   CREATININE 0.69 11/26/2022   Lab Results  Component Value Date   MICRALBCREAT 10 11/09/2020   MICRALBCREAT 7.6 01/29/2017  On losartan .  -+ h/o HL; last set of lipids: Lab Results  Component Value Date   CHOL 110 11/26/2022   HDL 43.00 11/26/2022   LDLCALC 52 11/26/2022  TRIG 75.0 11/26/2022   CHOLHDL 3 11/26/2022  Not on a statin.  She did not start Lipitor 40 mg daily as advised.  - last eye exam was 10/28/2023: + [facility]  She also sees a retina specialist - Dr. Raj.  -She denies numbness and tingling in her feet.  Last foot exam 06/25/2023.  She also has GERD, HTN. She has cardiomyopathy with a lower ejection fraction: 45-50%. She had a heart cath 09/13/2021 >> no blockages.   ROS: + see HPI  I reviewed pt's medications, allergies, PMH, social hx, family hx, and changes were documented in the history of present illness. Otherwise, unchanged from my initial visit note.  Past Medical History:  Diagnosis Date   Colon polyps    Diabetes mellitus    Glaucoma    History of chicken pox    HTN (hypertension)    Meralgia paresthetica    Past Surgical History:  Procedure Laterality Date   BREAST CYST EXCISION Left     patient states btw [phone]   CHOLECYSTECTOMY  06/16/2008   LEFT HEART CATH AND CORONARY ANGIOGRAPHY N/A 09/13/2021   Procedure: LEFT HEART CATH AND CORONARY ANGIOGRAPHY;  Surgeon: Wendel Lurena POUR, MD;  Location: [facility];  Service: Cardiovascular;  Laterality: N/A;   TONSILLECTOMY AND ADENOIDECTOMY     Social History   Social History   Marital Status: Widowed    Spouse Name: N/A   Number of Children: 1   Occupational History      Social History Main Topics   Smoking status: Never Smoker    Smokeless tobacco: Not on file   Alcohol Use: No   Drug Use: No   Current Outpatient Medications on File Prior to Visit  Medication Sig Dispense Refill   Ascorbic Acid (VITAMIN C) 1000 MG tablet Take 2,000 mg by mouth daily.     aspirin  EC 81 MG tablet Take 81 mg by mouth daily. Swallow whole.     B Complex-C (SUPER B COMPLEX PO) Take 2 capsules by mouth daily.     Blood Glucose Monitoring Suppl (ONE TOUCH ULTRA 2) w/Device KIT Use to check blood sugar 3 times daily 1 kit 0   Cholecalciferol (VITAMIN D3) 50 MCG (2000 UT) CAPS Take 15,000 Units by mouth daily. otc     Continuous Glucose Receiver (FREESTYLE LIBRE 3 READER) DEVI USE TO MONITOR GLUCOSE CONTINUOUSLY 1 each 0   Continuous Glucose Sensor (FREESTYLE LIBRE 3 PLUS SENSOR) MISC Inject 1 Device into the skin continuous. Change sensor every 15 days, use to monitor glucose continuously 6 each 3   empagliflozin  (JARDIANCE ) 25 MG TABS tablet Take 1 tablet (25 mg total) by mouth daily. TAKE 1 TABLET BY MOUTH EVERY DAY BEFORE BREAKFAST (Patient not taking: Reported on 12/09/2023) 90 tablet 2   glucose blood (ONETOUCH ULTRA) test strip USE AS INSTRUCTED TO CHECK  SUGAR 3 TIMES DAILY 300 strip 4   insulin  glargine (LANTUS  SOLOSTAR) 100 UNIT/ML Solostar Pen INJECT 25 UNITS INTO THE SKIN DAILY. (Patient not taking: Reported on 12/09/2023) 22.5 mL 2   insulin  glargine (LANTUS  SOLOSTAR) 100 UNIT/ML Solostar Pen Lot: [id number] Exp: 10/13/2025 3 mL     Insulin  Pen Needle 32G X 4 MM MISC Use 4x a day 400 each 3   KRILL OIL PO Take by mouth daily.     Lancets (ONETOUCH DELICA PLUS LANCET33G) MISC TEST 3 TIMES DAILY 300 each 1   losartan  (COZAAR ) 100 MG tablet TAKE 1 TABLET BY  MOUTH EVERY DAY 90 tablet 3   magnesium  oxide (MAG-OX) 400 (240 Mg) MG tablet Take 1 tablet by mouth at bedtime.     Multiple Vitamin (MULTIVITAMIN) tablet Take 1 tablet by mouth daily.     tirzepatide  (MOUNJARO ) 5 MG/0.5ML Pen Inject 5 mg into the skin once a week. (Patient not taking: Reported on 12/09/2023) 6 mL 3   No current facility-administered medications on file prior to visit.  LMP  (LMP Unknown)  Allergies  Allergen Reactions   Humalog  [Insulin  Lispro] Other (See Comments)    Paresthesia - hands   Latex Hives    itching   Family History  Problem Relation Age of Onset   Hypertension Mother    Diabetes Mother    Thyroid  disease Mother    Diabetes Father    Cancer Father        Colon cancer   Hypertension Father    Diabetes Sister    Diabetes Sister    Diabetes Sister    Diabetes Sister    PE:  BP 122/80   Pulse (!) 55   Ht 5' 1 (1.549 m)   Wt 156 lb 12.8 oz (71.1 kg)   LMP  (LMP Unknown)   SpO2 98%   BMI 29.63 kg/m   Wt Readings from Last 3 Encounters:  12/23/23 156 lb 12.8 oz (71.1 kg)  12/09/23 155 lb (70.3 kg)  06/25/23 155 lb 12.8 oz (70.7 kg)   Constitutional: overweight, in NAD Eyes: EOMI, no exophthalmos ENT: no thyromegaly, no cervical lymphadenopathy Cardiovascular: RRR, No MRG Respiratory: CTA B Musculoskeletal: no deformities Skin: + Macular darkened discoloration on bilateral legs Neurological: no tremor with outstretched hands  ASSESSMENT: 1. DM2, insulin -dependent, uncontrolled, with complications - DR  No family history of medullary thyroid  cancer or personal history of pancreatitis.  2. Overweight  PLAN:  1. Patient with longstanding, uncontrolled, type 2 diabetes, previously on SGLT2 inhibitor, weekly  GLP-1/GIP receptor agonist and long-acting insulin , with good control.  However, since last visit, unfortunately, he ran out her diabetic medications approximately 1.5 months ago and she did not afford refilling them.  She is now off all of the medicines.   - Latest HbA1c was slightly higher, but it was still excellent, at 6.0%.  Sugars were at goal in the morning and they were mostly at goal after dinner, even during the holidays.  I did not recommend a change in regimen.  We did discuss again about trying the CGM - she was interested in this.  She was not able to obtain it since last visit. -At today's, she just received Lantus  for patient assistance and will start this today.  We also gave her Jardiance  samples for now and hopefully we can resume this in the future.  I did send a prescription for generic Farxiga  to her pharmacy to see if she can obtain this.  For now, we will have to hold off starting Mounjaro .  We can try Ozempic  through the [employer] patient assistance program at next visit. - I suggested to:  Patient Instructions  Please restart: - Lantus  15 units daily in am and increase the dose by 5 units every 3 days until the sugars improve - Jardiance  25 mg before breakfast   Please return in 3 months.  - we checked her HbA1c: 8% (higher) - advised to check sugars at different times of the day - 1x a day, rotating check times - advised for yearly eye exams >> she is UTD - she  is due for annual labs - prefers to wait until appt with PCP, will check an ACR - return to clinic in 3 months   2. Overweight - She tried intermittent fasting and also plant-based diet in the past - Started to go to the gym after retirement - Unfortunately, she is off Mounjaro  and Jardiance  which both could have helped with weight loss.  We will try to start back on Jardiance  or Farxiga  today. - Weight was approximately stable at last visit and again today.  Lela Fendt, MD PhD [facility]

## 2023-12-23 NOTE — Addendum Note (Signed)
 Addended by: CLEOTILDE ROLIN RAMAN on: 12/23/2023 09:44 AM   Modules accepted: Orders

## 2023-12-23 NOTE — Patient Instructions (Addendum)
 Please restart: - Lantus  15 units daily in am and increase the dose by 5 units every 3 days until the sugars improve - Jardiance  25 mg before breakfast   Please return in 3 months.

## 2023-12-24 ENCOUNTER — Ambulatory Visit: Payer: Self-pay | Admitting: Internal Medicine

## 2023-12-24 LAB — MICROALBUMIN / CREATININE URINE RATIO
Creatinine, Urine: 40 mg/dL (ref 20–275)
Microalb Creat Ratio: 15 mg/g{creat} (ref <30)
Microalb, Ur: 0.6 mg/dL

## 2023-12-25 NOTE — Telephone Encounter (Signed)
 Pharmacy Patient Advocate Encounter  Received notification from [employer] that Prior Authorization for [facility] 3 plus has been APPROVED from 12/21/23 to 06/15/2024   PA #/Case ID/Reference #: [id number]

## 2024-02-19 ENCOUNTER — Ambulatory Visit (INDEPENDENT_AMBULATORY_CARE_PROVIDER_SITE_OTHER): Admitting: Physician Assistant

## 2024-02-19 ENCOUNTER — Ambulatory Visit: Payer: Self-pay | Admitting: Physician Assistant

## 2024-02-19 ENCOUNTER — Encounter: Payer: Self-pay | Admitting: Physician Assistant

## 2024-02-19 VITALS — BP 130/80 | HR 66 | Temp 97.3°F | Ht 60.5 in | Wt 151.4 lb

## 2024-02-19 DIAGNOSIS — D72819 Decreased white blood cell count, unspecified: Secondary | ICD-10-CM

## 2024-02-19 DIAGNOSIS — E113591 Type 2 diabetes mellitus with proliferative diabetic retinopathy without macular edema, right eye: Secondary | ICD-10-CM

## 2024-02-19 DIAGNOSIS — E119 Type 2 diabetes mellitus without complications: Secondary | ICD-10-CM

## 2024-02-19 DIAGNOSIS — Z7985 Long-term (current) use of injectable non-insulin antidiabetic drugs: Secondary | ICD-10-CM

## 2024-02-19 DIAGNOSIS — Z0000 Encounter for general adult medical examination without abnormal findings: Secondary | ICD-10-CM

## 2024-02-19 DIAGNOSIS — Z794 Long term (current) use of insulin: Secondary | ICD-10-CM | POA: Diagnosis not present

## 2024-02-19 DIAGNOSIS — Z7984 Long term (current) use of oral hypoglycemic drugs: Secondary | ICD-10-CM | POA: Diagnosis not present

## 2024-02-19 DIAGNOSIS — I152 Hypertension secondary to endocrine disorders: Secondary | ICD-10-CM

## 2024-02-19 DIAGNOSIS — R1033 Periumbilical pain: Secondary | ICD-10-CM | POA: Insufficient documentation

## 2024-02-19 DIAGNOSIS — Z860100 Personal history of colon polyps, unspecified: Secondary | ICD-10-CM | POA: Insufficient documentation

## 2024-02-19 DIAGNOSIS — Z83719 Family history of colon polyps, unspecified: Secondary | ICD-10-CM | POA: Insufficient documentation

## 2024-02-19 DIAGNOSIS — Z800 Family history of malignant neoplasm of digestive organs: Secondary | ICD-10-CM | POA: Insufficient documentation

## 2024-02-19 LAB — COMPREHENSIVE METABOLIC PANEL WITH GFR
ALT: 23 U/L (ref 0–35)
AST: 20 U/L (ref 0–37)
Albumin: 4 g/dL (ref 3.5–5.2)
Alkaline Phosphatase: 86 U/L (ref 39–117)
BUN: 13 mg/dL (ref 6–23)
CO2: 26 meq/L (ref 19–32)
Calcium: 8.7 mg/dL (ref 8.4–10.5)
Chloride: 106 meq/L (ref 96–112)
Creatinine, Ser: 0.53 mg/dL (ref 0.40–1.20)
GFR: 92.23 mL/min (ref >60.00)
Glucose, Bld: 121 mg/dL — ABNORMAL HIGH (ref 70–99)
Potassium: 3.9 meq/L (ref 3.5–5.1)
Sodium: 139 meq/L (ref 135–145)
Total Bilirubin: 0.3 mg/dL (ref 0.2–1.2)
Total Protein: 6.4 g/dL (ref 6.0–8.3)

## 2024-02-19 LAB — CBC WITH DIFFERENTIAL/PLATELET
Basophils Absolute: 0 K/uL (ref 0.0–0.1)
Basophils Relative: 0.6 % (ref 0.0–3.0)
Eosinophils Absolute: 0 K/uL (ref 0.0–0.7)
Eosinophils Relative: 1.2 % (ref 0.0–5.0)
HCT: 40 % (ref 36.0–46.0)
Hemoglobin: 13.3 g/dL (ref 12.0–15.0)
Lymphocytes Relative: 34.3 % (ref 12.0–46.0)
Lymphs Abs: 1.2 K/uL (ref 0.7–4.0)
MCHC: 33.4 g/dL (ref 30.0–36.0)
MCV: 95.6 fl (ref 78.0–100.0)
Monocytes Absolute: 0.2 K/uL (ref 0.1–1.0)
Monocytes Relative: 7.2 % (ref 3.0–12.0)
Neutro Abs: 1.9 K/uL (ref 1.4–7.7)
Neutrophils Relative %: 56.7 % (ref 43.0–77.0)
Platelets: 87 K/uL — ABNORMAL LOW (ref 150.0–400.0)
RBC: 4.18 Mil/uL (ref 3.87–5.11)
RDW: 14.3 % (ref 11.5–15.5)
WBC: 3.4 K/uL — ABNORMAL LOW (ref 4.0–10.5)

## 2024-02-19 LAB — LIPID PANEL
Cholesterol: 120 mg/dL (ref 0–200)
HDL: 42.4 mg/dL (ref >39.00)
LDL Cholesterol: 63 mg/dL (ref 0–99)
NonHDL: 77.6
Total CHOL/HDL Ratio: 3
Triglycerides: 73 mg/dL (ref 0.0–149.0)
VLDL: 14.6 mg/dL (ref 0.0–40.0)

## 2024-02-19 NOTE — Progress Notes (Signed)
 Subjective:    Kristen [name] is a 73 y.o. female and is here for a comprehensive physical exam.  HPI  Health Maintenance Due  Topic Date Due   Diabetic kidney evaluation - eGFR measurement  11/26/2023    Discussed the use of AI scribe software for clinical note transcription with the patient, who gave verbal consent to proceed.  History of Present Illness Kristen [name] is a 73 year old female with diabetes who presents for a routine follow-up visit.  She manages her diabetes with insulin  and Mounjaro , obtained from her sister, and is not taking Jardiance . Her current medications include Lantus  20-25 units and Losartan  100 mg daily. She experiences numbness and tingling, which she associates with insulin  use, and adjusts her Lantus  intake accordingly. She checks her blood sugar once a day due to the cost of her meter. She aims to lose 20 more pounds and finds Mounjaro  helpful for weight loss. She experiences sugar cravings and uses protein cereal to manage them.  She denies alcohol consumption and smoking. She reports good sleep and no mental health concerns. She engages in physical activity by working in her yard daily. Her diet includes limited carbohydrates, focusing on vegetables and meat, and she drinks water throughout the day, occasionally having soda or juice on weekends.  Her family history includes four living sisters, all of whom are diabetic. She denies chest pain, shortness of breath, headaches, hearing concerns, neck pain, leg swelling, stomach issues, constipation, diarrhea, and rectal bleeding. She has two teeth remaining and does not see a dentist.    Health Maintenance: Immunizations -- UpToDate - declined Colonoscopy -- UpToDate; due next year Mammogram -- UpToDate  PAP -- n/a Bone Density -- UpToDate  Diet -- avoids excessive carbohydrates; drinks water Exercise -- working in yard  Sleep habits -- no Mood -- no  UTD with dentist? - no UTD with eye doctor? -  yes  Weight history: Wt Readings from Last 10 Encounters:  02/19/24 151 lb 6.1 oz (68.7 kg)  12/23/23 156 lb 12.8 oz (71.1 kg)  12/09/23 155 lb (70.3 kg)  06/25/23 155 lb 12.8 oz (70.7 kg)  12/16/22 156 lb 3.2 oz (70.9 kg)  11/26/22 155 lb (70.3 kg)  08/11/22 163 lb 6.4 oz (74.1 kg)  04/01/22 176 lb 3.2 oz (79.9 kg)  01/10/22 174 lb 6.4 oz (79.1 kg)  12/03/21 170 lb 9.6 oz (77.4 kg)   Body mass index is 29.08 kg/m. No LMP recorded (lmp unknown). Patient is postmenopausal.  Alcohol use:  reports no history of alcohol use.  Tobacco use:  Tobacco Use: Medium Risk (02/19/2024)   Patient History    Smoking Tobacco Use: Former    Smokeless Tobacco Use: Never    Passive Exposure: Never   Eligible for lung cancer screening? no     02/19/2024    8:41 AM  Depression screen PHQ 2/9  Decreased Interest 0  Down, Depressed, Hopeless 0  PHQ - 2 Score 0     Other providers/specialists: Patient Care Team: Job Lukes, [place] as PCP - General (Physician Assistant) Thukkani, Arun K, MD as PCP - Cardiology (Cardiology) Trixie File, MD as Consulting Physician (Internal Medicine) Rockney Longs, MD as Referring Physician (Optometry)    PMHx, SurgHx, SocialHx, Medications, and Allergies were reviewed in the Visit Navigator and updated as appropriate.   Past Medical History:  Diagnosis Date   Colon polyps    Diabetes mellitus    Glaucoma    History of  chicken pox    HTN (hypertension)    Meralgia paresthetica      Past Surgical History:  Procedure Laterality Date   BREAST CYST EXCISION Left    patient states btw [phone]   CHOLECYSTECTOMY  06/16/2008   LEFT HEART CATH AND CORONARY ANGIOGRAPHY N/A 09/13/2021   Procedure: LEFT HEART CATH AND CORONARY ANGIOGRAPHY;  Surgeon: Wendel Lurena POUR, MD;  Location: [facility];  Service: Cardiovascular;  Laterality: N/A;   TONSILLECTOMY AND ADENOIDECTOMY       Family History  Problem Relation Age of Onset    Hypertension Mother    Diabetes Mother    Thyroid  disease Mother    Diabetes Father    Cancer Father        Colon cancer   Hypertension Father    Diabetes Sister    Diabetes Sister    Diabetes Sister    Diabetes Sister     Social History   Tobacco Use   Smoking status: Former    Current packs/day: 0.00    Average packs/day: 0.8 packs/day for 4.0 years (3.0 ttl pk-yrs)    Types: Cigarettes    Start date: 64    Quit date: 20    Years since quitting: 50.7    Passive exposure: Never   Smokeless tobacco: Never  Vaping Use   Vaping status: Never Used  Substance Use Topics   Alcohol use: No   Drug use: No    Review of Systems:   Review of Systems  Constitutional:  Negative for chills, fever, malaise/fatigue and weight loss.  HENT:  Negative for hearing loss, sinus pain and sore throat.   Respiratory:  Negative for cough and hemoptysis.   Cardiovascular:  Negative for chest pain, palpitations, leg swelling and PND.  Gastrointestinal:  Negative for abdominal pain, constipation, diarrhea, heartburn, nausea and vomiting.  Genitourinary:  Negative for dysuria, frequency and urgency.  Musculoskeletal:  Negative for back pain, myalgias and neck pain.  Skin:  Negative for itching and rash.  Neurological:  Negative for dizziness, tingling, seizures and headaches.  Endo/Heme/Allergies:  Negative for polydipsia.  Psychiatric/Behavioral:  Negative for depression. The patient is not nervous/anxious.     Objective:   BP 130/80 (BP Location: Left Arm, Patient Position: Sitting, Cuff Size: Normal)   Pulse 66   Temp (!) 97.3 F (36.3 C) (Temporal)   Ht 5' 0.5 (1.537 m)   Wt 151 lb 6.1 oz (68.7 kg)   LMP  (LMP Unknown)   SpO2 98%   BMI 29.08 kg/m  Body mass index is 29.08 kg/m.   General Appearance:    Alert, cooperative, no distress, appears stated age  Head:    Normocephalic, without obvious abnormality, atraumatic  Eyes:    PERRL, conjunctiva/corneas clear, EOM's  intact, fundi    benign, both eyes  Ears:    Normal TM's and external ear canals, both ears  Nose:   Nares normal, septum midline, mucosa normal, no drainage    or sinus tenderness  Throat:   Lips, mucosa, and tongue normal; teeth and gums normal  Neck:   Supple, symmetrical, trachea midline, no adenopathy;    thyroid :  no enlargement/tenderness/nodules; no carotid   bruit or JVD  Back:     Symmetric, no curvature, ROM normal, no CVA tenderness  Lungs:     Clear to auscultation bilaterally, respirations unlabored  Chest Wall:    No tenderness or deformity   Heart:    Regular rate and rhythm, S1  and S2 normal, no murmur, rub or gallop  Breast Exam:    Deferred  Abdomen:     Soft, non-tender, bowel sounds active all four quadrants,    no masses, no organomegaly  Genitalia:    Deferred   Extremities:   Extremities normal, atraumatic, no cyanosis or edema  Pulses:   2+ and symmetric all extremities  Skin:   Skin color, texture, turgor normal, no rashes or lesions  Lymph nodes:   Cervical, supraclavicular, and axillary nodes normal  Neurologic:   CNII-XII intact, normal strength, sensation and reflexes    throughout    Assessment/Plan:   Assessment and Plan Assessment & Plan Adult Wellness Visit Routine wellness visit completed. Vaccinations declined. Engages in physical activity and follows a low-carb diet. No mental health concerns or sleep issues reported. Defers/declines recommendations today, and has full decision making capacity vaccines.  - Discussed advanced directives. Family aware of her wishes against aggressive interventions, but not documented. Encouraged documentation to ensure wishes are respected. - Provide advanced directives paperwork for documentation of healthcare wishes.Provide a sheet with dietary suggestions for managing sugar cravings.  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 Not on Jardiance . Reports numbness and tingling with insulin , reducing Lantus  intake. Limited glucose monitoring due to cost. Regular eye exams with no bleeding reported. - Perform blood test to check kidney function. - Provide Freestyle Libre sample for glucose monitoring.  Hypertension  Hypertension well-controlled with Losartan  100 mg daily. Blood pressure readings satisfactory.        Lucie Buttner, PA-C [facility]

## 2024-02-19 NOTE — Patient Instructions (Signed)
  VISIT SUMMARY: Today, you had a routine follow-up visit to manage your diabetes and overall health. We discussed your current medications, diet, and physical activity, and addressed your goals for weight loss and advanced care planning.  YOUR PLAN: ADULT WELLNESS VISIT: Routine wellness visit completed. You are physically active and follow a low-carb diet. No mental health or sleep issues reported. -We provided you with a sheet containing dietary suggestions to help manage your sugar cravings.  TYPE 2 DIABETES MELLITUS WITH PROLIFERATIVE DIABETIC RETINOPATHY, RIGHT EYE: Your diabetes is managed with insulin  and Mounjaro , though affordability is an issue. You experience numbness and tingling with insulin  and have limited glucose monitoring due to cost. -We will perform a blood test to check your kidney function. -We provided you with a Freestyle Libre sample for easier glucose monitoring.  HYPERTENSION DUE TO ENDOCRINE DISORDER: Your hypertension is well-controlled with Losartan  100 mg daily. Your blood pressure readings are satisfactory.  OVERWEIGHT: You aim to lose 20 more pounds and find Mounjaro  helpful for weight loss. You manage sugar cravings with protein cereal.  GOALS OF CARE: We discussed your advanced care wishes, which are known to your family but not documented. -We provided you with advanced directives paperwork to document your healthcare wishes.                      Contains text generated by Abridge.                                 Contains text generated by Abridge.

## 2024-03-02 LAB — HM DEXA SCAN: HM Dexa Scan: NORMAL

## 2024-03-03 DIAGNOSIS — H35033 Hypertensive retinopathy, bilateral: Secondary | ICD-10-CM | POA: Diagnosis not present

## 2024-03-03 DIAGNOSIS — E113391 Type 2 diabetes mellitus with moderate nonproliferative diabetic retinopathy without macular edema, right eye: Secondary | ICD-10-CM | POA: Diagnosis not present

## 2024-03-03 DIAGNOSIS — H43813 Vitreous degeneration, bilateral: Secondary | ICD-10-CM | POA: Diagnosis not present

## 2024-03-03 DIAGNOSIS — E113312 Type 2 diabetes mellitus with moderate nonproliferative diabetic retinopathy with macular edema, left eye: Secondary | ICD-10-CM | POA: Diagnosis not present

## 2024-03-03 DIAGNOSIS — H43391 Other vitreous opacities, right eye: Secondary | ICD-10-CM | POA: Diagnosis not present

## 2024-03-03 DIAGNOSIS — H35412 Lattice degeneration of retina, left eye: Secondary | ICD-10-CM | POA: Diagnosis not present

## 2024-03-10 ENCOUNTER — Encounter: Payer: Self-pay | Admitting: Pharmacist

## 2024-03-10 ENCOUNTER — Other Ambulatory Visit: Payer: Self-pay | Admitting: Physician Assistant

## 2024-03-10 ENCOUNTER — Telehealth: Payer: Self-pay | Admitting: Pharmacist

## 2024-03-10 DIAGNOSIS — I152 Hypertension secondary to endocrine disorders: Secondary | ICD-10-CM

## 2024-03-10 DIAGNOSIS — Z794 Long term (current) use of insulin: Secondary | ICD-10-CM

## 2024-03-10 MED ORDER — LOSARTAN POTASSIUM 50 MG PO TABS: 50.0000 mg | ORAL_TABLET | Freq: Every day | ORAL | 1 refills | Status: AC

## 2024-03-10 NOTE — Progress Notes (Signed)
 Pharmacy Quality Measure Review  This patient is appearing on a report for being at risk of failing the adherence measure for hypertension (ACEi/ARB) medications this calendar year.   Medication: losartan  100mg  Last fill date: 11/05/2023 for 90 day supply.  Spoke to patient today by phone. She reports she has only been taking losartan  100mg  every OTHER day because she felt her blood pressure was too low when she was taking it every day.  She provided the following blood pressure readings from home - 120/71 and 110/65; Heart rate usually in the 60's.  She checked blood pressure while I was on the phone with her and it was 130/72 and HR 60 - she reports she did no take losartan  yesterday but did take it this morning.   Ms. Nierenberg also reports she has not been taking Farxiga  due to cost. Looks like she has a $255 deductible to meet so the first fills would be around $302 and thereafter $47 / month. Patient reports she would have difficulty affording the first fill due to deductible.   Lab Results  Component Value Date   HGBA1C 8.0 (A) 12/23/2023   BP Readings from Last 3 Encounters:  03/10/24 130/72  02/19/24 130/80  12/23/23 122/80     Plan:  Discussed cardio-renal benefits of both losartan  and Farxiga .  Will notify both PCP, Lucie Buttner and endocrinologist, Dr Trixie regarding patient only taking losartan  100mg  every other day - I suggest she take 0.5 tablet = 50mg  EVERY DAY instead. Check blood pressure daily for the next 2 weeks to assess if 50mg  will keep blood pressure at goal of < 130/80.  Screened for LIS - patient income was above cut off. She also reports she was denied Medicaid in the past.  Forwarding request to Rx Assist Team to help with application for Farxiga  patient assistance program.   Madelin Ray, PharmD Clinical Pharmacist [facility]  Population Health [phone]  03/14/2024 - Addendum Notified patient that Rx for losartan  50mg  daily was sent to  pharmacy.   Madelin Ray, PharmD Clinical Pharmacist [facility]  Population Health [phone]

## 2024-03-14 NOTE — Telephone Encounter (Signed)
-----   Message from Lucie Buttner sent at 03/10/2024  5:02 PM EDT ----- Thank you for reaching out. I have sent in losartan  50 mg daily for her to help with this. Please inform her of this change. ----- Message ----- From: Carla Milling, RPH-CPP Sent: 03/10/2024  11:04 AM EDT To: Lucie Buttner, PA; Lela Fendt, MD  Patient has been taking losartan  100mg  every OTHER day - because she felt her blood pressure was too low.  Blood pressure at home and in office has not been high but in on the upper end of goal. Microalbumin has been WNL.  Recommended she continue to check blood pressure daily and record. She will be seen in endo office 04/01/2024.  I think 50mg  every day would give more consistent blood pressure numbers. She is not taking Farxiga  due to cost. (She has a $255 deductible to meet) Sent request for Med Assist Team to help with patient assistance program application.  Discussed cardio-renal benefits of both losartan  and Farxiga .   SABRA

## 2024-03-24 ENCOUNTER — Other Ambulatory Visit: Payer: Self-pay | Admitting: Pharmacist

## 2024-03-24 ENCOUNTER — Telehealth: Payer: Self-pay

## 2024-03-24 NOTE — Progress Notes (Signed)
 03/24/2024 Name: Kristen [name] MRN: [id number] DOB: 1950/07/28  Chief Complaint  Patient presents with   Diabetes   Medication Adherence   Hypertension    Kristen [name] is a 73 y.o. year old female who presented for a telephone visit.   They were referred to the pharmacist by their PCP for assistance in managing diabetes, hypertension, and medication access.    Subjective:  Care Team: Primary Care Provider: Job Lukes, [place] ; Next Scheduled Visit: not currently scheduled Endocrinologist G[place]; Next Scheduled Visit: 04/01/2024  Medication Access/Adherence  Current Pharmacy:  [id number] [place], [place] - [address] [place] [address] Phone: [phone] Fax: [phone]  [id number] - [place], [place] - [address] [facility] [address] [place] [address] Phone: [phone] Fax: [phone]  [id number] [place] [facility] - [address] [place] [address] Phone: [phone] Fax: [phone]  [id number] - [place], [place] - [address] AT CORNER OF [facility] [address] [place] [address] Phone: [phone] Fax: [phone]  [id number] [place], [place] - [address] AT [facility] [address] [facility] [address] [place] [address] Phone: [phone] Fax: [phone]   Patient reports affordability concerns with their medications: Yes  - cost of Farxiga  is high.  Patient reports access/transportation concerns to their pharmacy: No  Patient reports adherence concerns with their medications:  No     Diabetes:  Current medications:  Lantus  25 units daily - but patient states she is taking it every OTHER day Mounjaro  7.5mg  weekly  Farxiga  10mg  daily - has not been taking due to cost  Current glucose readings: has not checked recently. She was recently approved to get  [facility] [place] sensors again and will pick them up tomorrow.   Patient denies hypoglycemic s/sx including no dizziness, shakiness, sweating. Patient reports hyperglycemic symptoms including tingling in her hands. Denies polyuria, polydipsia, polyphagia, nocturia.   Current medication access support: none  Macrovascular and Microvascular Risk Reduction:  Statin? no; patient declined statin therapy - she was prescribed atorvastatin  in 2023 but never started - patient preference. ACEi/ARB? yes (losrtan 50mg  once a day) Last urinary albumin/creatinine ratio:  Lab Results  Component Value Date   MICRALBCREAT 15 12/23/2023   MICRALBCREAT 10 11/09/2020   MICRALBCREAT 7.6 01/29/2017   Last eye exam:  Lab Results  Component Value Date   HMDIABEYEEXA Retinopathy (A) 10/28/2023   Last foot exam: 06/25/2023 Tobacco Use:  Tobacco Use: Medium Risk (03/10/2024)   Patient History    Smoking Tobacco Use: Former    Smokeless Tobacco Use: Never    Passive Exposure: Never   Hypertension:  Current medications: losartan  50mg  daily   Patient has a validated, automated, upper arm home BP cuff Current blood pressure readings readings: 120's to 130's / 70's  Patient denies hypotensive s/sx including no dizziness, lightheadedness.  Patient denies hypertensive symptoms including no headache, chest pain, shortness of breath    Objective:  Lab Results  Component Value Date   HGBA1C 8.0 (A) 12/23/2023    Lab Results  Component Value Date   CREATININE 0.53 02/19/2024   BUN 13 02/19/2024   NA 139 02/19/2024   K 3.9 02/19/2024   CL 106 02/19/2024   CO2 26 02/19/2024    Lab Results  Component Value Date   CHOL 120 02/19/2024   HDL 42.40  02/19/2024   LDLCALC 63 02/19/2024   TRIG 73.0 02/19/2024   CHOLHDL 3 02/19/2024    Medications Reviewed Today     Reviewed by Carla Milling, RPH-CPP (Pharmacist) on 03/24/24 at 1014  Med List Status: <None>   Medication Order Taking? Sig  Documenting Provider Last Dose Status Informant  aspirin  EC 81 MG tablet [id number] Yes Take 81 mg by mouth daily. Swallow whole. [provider]  Active Self  B Complex-C (SUPER B COMPLEX PO) [id number] Yes Take 2 capsules by mouth daily. [provider]  Active Self  Blood Glucose Monitoring Suppl (ONE TOUCH ULTRA 2) w/Device KIT [id number] Yes Use to check blood sugar 3 times daily Trixie File, MD  Active Self  Cholecalciferol (VITAMIN D3) 50 MCG (2000 UT) CAPS [id number] Yes Take 15,000 Units by mouth daily. otc [provider]  Active Self  dapagliflozin  propanediol (FARXIGA ) 10 MG TABS tablet [id number]  Take 1 tablet (10 mg total) by mouth daily before breakfast.  Patient not taking: Reported on 03/24/2024   Trixie File, MD  Active   glucose blood [facility] ULTRA) test strip [id number]  USE AS INSTRUCTED TO CHECK  SUGAR 3 TIMES DAILY Trixie File, MD  Active Self  insulin  glargine (LANTUS  SOLOSTAR) 100 UNIT/ML Solostar Pen [id number] Yes INJECT 25 UNITS INTO THE SKIN DAILY.  Patient taking differently: Inject 25 Units into the skin. Every other day   Trixie File, MD  Active   Insulin  Pen Needle 32G X 4 MM MISC [id number]  Use 4x a day Trixie File, MD  Active Self  KRILL OIL PO [id number]  Take by mouth daily. [provider]  Active   Lancets [facility] CATHRYNE PLUS [place]) MISC [id number]  TEST 3 TIMES DAILY Trixie File, MD  Active Self  losartan  (COZAAR ) 50 MG tablet [id number] Yes Take 1 tablet (50 mg total) by mouth daily. Job Lukes, [place]  Active   magnesium  oxide (MAG-OX) 400 (240 Mg) MG tablet [id number] Yes Take 1 tablet by mouth at bedtime. [provider]  Active   Multiple Vitamin (MULTIVITAMIN) tablet [id number]  Take 1 tablet by mouth daily. [provider]  Active Self  OIL OF OREGANO PO [id number] Yes Take 2 capsules by mouth daily in the afternoon. [provider]  Active   tirzepatide   (MOUNJARO ) 7.5 MG/0.5ML Pen [id number] Yes Inject 7.5 mg into the skin once a week. [provider]  Active               Assessment/Plan:   Diabetes: - Currently uncontrolled; goal A1c <7%. Cardiorenal risk reduction is opportunities for improvement.. Blood pressure is at goal <130/80. LDL is at goal.  - Reviewed long term cardiovascular and renal outcomes of uncontrolled blood sugar. and Patient will start using [employer] sensors to check blood glucose frequently throughout the day.  - Recommend to continue Lantus  25 units but recommended she take EVERY day. . - Will forward request for medication assistance program application to Med Assistance Team for Farxiga .   Hypertension: - Currently controlled - Reviewed long term cardiovascular and renal outcomes of uncontrolled blood pressure - Reviewed appropriate blood pressure monitoring technique and reviewed goal blood pressure. Recommended to check home blood pressure and heart rate 2 or 3 times per week.  - Recommend to continue losartan  50mg  daily     Follow Up Plan: 2 to 4 weeks.   Milling Carla, PharmD Clinical Pharmacist [facility]  Population Health [phone]

## 2024-03-24 NOTE — Telephone Encounter (Signed)
 PAP: Patient assistance application for Farxiga through [employer] (AZ&Me) has been mailed to pt's home address on file. Provider portion of application will be faxed to provider's office.

## 2024-03-28 ENCOUNTER — Ambulatory Visit: Admitting: Internal Medicine

## 2024-04-01 ENCOUNTER — Encounter: Payer: Self-pay | Admitting: Internal Medicine

## 2024-04-01 ENCOUNTER — Ambulatory Visit (INDEPENDENT_AMBULATORY_CARE_PROVIDER_SITE_OTHER): Admitting: Internal Medicine

## 2024-04-01 VITALS — BP 120/70 | HR 67 | Ht 60.5 in | Wt 153.8 lb

## 2024-04-01 DIAGNOSIS — E11319 Type 2 diabetes mellitus with unspecified diabetic retinopathy without macular edema: Secondary | ICD-10-CM

## 2024-04-01 DIAGNOSIS — E663 Overweight: Secondary | ICD-10-CM | POA: Diagnosis not present

## 2024-04-01 DIAGNOSIS — Z794 Long term (current) use of insulin: Secondary | ICD-10-CM

## 2024-04-01 DIAGNOSIS — E66811 Obesity, class 1: Secondary | ICD-10-CM | POA: Diagnosis not present

## 2024-04-01 MED ORDER — DAPAGLIFLOZIN PROPANEDIOL 10 MG PO TABS: 10.0000 mg | ORAL_TABLET | Freq: Every day | ORAL | 3 refills | Status: AC

## 2024-04-01 MED ORDER — MOUNJARO 7.5 MG/0.5ML ~~LOC~~ SOAJ: 7.5000 mg | SUBCUTANEOUS | 3 refills | Status: AC

## 2024-04-01 NOTE — Patient Instructions (Addendum)
 Please decrease: - Lantus  20 units daily in am  Continue:  - Mounjaro  7.5 mg weekly  Try to start: - Farxiga  10 mg before b'fast   Please return in 3-4 months

## 2024-04-01 NOTE — Progress Notes (Signed)
 Pelvic patient ID: SOLANGEL MCMANAWAY, female   DOB: 26-Sep-1950, 73 y.o.   MRN: [id number]  HPI: KAYONA FOOR is a 73 y.o.-year-old female, returning for f/u for DM2, dx in 2000, insulin -dependent since 2012, uncontrolled, with complications (DR).  Last visit 3 months ago.  Interim history: No increased urination, blurry vision, nausea, chest pain.  She was not able to obtain Farxiga  since last visit but started on Mounjaro  - her sister's -approximately 2 months ago.  She tolerates it well.  She was also able to start on the CGM since then.  Reviewed HbA1c levels: Lab Results  Component Value Date   HGBA1C 8.0 (A) 12/23/2023   HGBA1C 6.0 (A) 06/25/2023   HGBA1C 5.8 12/16/2022   HGBA1C 6.2 (A) 08/11/2022   HGBA1C 7.8 (A) 04/01/2022   HGBA1C 7.0 (A) 09/27/2021   HGBA1C 7.9 (A) 05/17/2021   HGBA1C 7.4 (H) 11/09/2020   HGBA1C 7.6 (A) 08/27/2020   HGBA1C 7.3 (A) 04/18/2020   HGBA1C 7.7 (H) 11/24/2019   HGBA1C 7.6 (H) 03/02/2019   HGBA1C 6.7 (A) 06/10/2018   HGBA1C 6.9 (A) 02/25/2018   HGBA1C 8.6 (A) 10/22/2017   HGBA1C 8.4 07/24/2017   HGBA1C 9.4 04/23/2017   HGBA1C 8.7 11/21/2016   HGBA1C 8.5 08/22/2016   HGBA1C 9.3 04/09/2016   At last visit she was unfortunately off all of the medicines: - Lantus   40 units 2x a day >> .SABRASABRA 40 units in am >> 30-(40 ) >> 25 units in am >> off 2/2 $ for last 1.5 mo - Jardiance  25 mg before breakfast >> restarted 11/2020 >> off 2/2 $ Mounjaro  7.5 >> 10 >> 5 mg weekly >> off 2/2 $ She stopped  Victoza  b/c $$$. Trulicity  not covered. She was on Jardiance  25 mg daily before b'fast. - added 03/2016 - stopped 06/2016  >> sugars increased >> at last visit, I advised her to restart >> did not do so as this was expensive. She tried Metformin  ER 500 mg >> still N/D/AP. She was previously on Basaglar  45 units daily.  She was also previously on Humalog  20 to 25 units before lunch and dinner. She was previously on Ozempic  -started 04/2020, then off, then restarted  11/2020.  She could not continue due to price.  At that time, I recommended to start: - Lantus  15 units daily >> taking 25 units daily - Mounjaro  (from her sister) 7.5 mg weekly - has 1.5 mo supply - Farxiga  10 mg before breakfast >> did not get ($)  Pt checks her sugars >4 times a day (CGM with receiver):  Previously: - am: 96-111 >> 97-109 >> 90s, 100-110 >> 158-178 - 2h after b'fast:  152  >> n/c >> 267 >> n/c - before lunch: 130-135 >> 130 >> 180 >> n/c - 2h after lunch: n/c >> 140-160 >> 160 >> 140s >> n/c - before dinner:  73, 110-173 >> n/c >> 125-132 >> n/c  - 2h after dinner: 159-200 >> 167-213 >> 200-250, 318 - bedtime: 175-200 >> 173, 180 >> 160s >> n/c - nighttime: 240-285 >> n/c >> 69 >> 78 >> n/c Lowest sugar was 69 >> 78 >> 97 >> 90s >> 158 >> 69; she has hypoglycemia awareness in the 70s. Highest sugar was 285 ... >> 318 >> 263  Glucometer: AccuChek  She does intermittent fasting. Meals: - brunch: grits + meat + 2 eggs - +/- snack tuna, boiled egg - dinner: meat + 3 veggies  -No CKD, last BUN/creatinine:  Lab Results  Component Value Date   BUN 13 02/19/2024   CREATININE 0.53 02/19/2024   Lab Results  Component Value Date   MICRALBCREAT 15 12/23/2023   MICRALBCREAT 10 11/09/2020   MICRALBCREAT 7.6 01/29/2017  On losartan .  -+ h/o HL; last set of lipids: Lab Results  Component Value Date   CHOL 120 02/19/2024   HDL 42.40 02/19/2024   LDLCALC 63 02/19/2024   TRIG 73.0 02/19/2024   CHOLHDL 3 02/19/2024  Not on a statin.  She did not start Lipitor 40 mg daily as advised.  - last eye exam was 10/28/2023: + [facility]  She also sees a retina specialist - Dr. Raj.  -She denies numbness and tingling in her feet.  Last foot exam 06/25/2023.  She also has GERD, HTN. She has cardiomyopathy with a lower ejection fraction: 45-50%. She had a heart cath 09/13/2021 >> no blockages.   ROS: + see HPI  I reviewed pt's medications, allergies, PMH,  social hx, family hx, and changes were documented in the history of present illness. Otherwise, unchanged from my initial visit note.  Past Medical History:  Diagnosis Date   Colon polyps    Diabetes mellitus    Glaucoma    History of chicken pox    HTN (hypertension)    Meralgia paresthetica    Past Surgical History:  Procedure Laterality Date   BREAST CYST EXCISION Left    patient states btw [phone]   CHOLECYSTECTOMY  06/16/2008   LEFT HEART CATH AND CORONARY ANGIOGRAPHY N/A 09/13/2021   Procedure: LEFT HEART CATH AND CORONARY ANGIOGRAPHY;  Surgeon: Wendel Lurena POUR, MD;  Location: [facility];  Service: Cardiovascular;  Laterality: N/A;   TONSILLECTOMY AND ADENOIDECTOMY     Social History   Social History   Marital Status: Widowed    Spouse Name: N/A   Number of Children: 1   Occupational History      Social History Main Topics   Smoking status: Never Smoker    Smokeless tobacco: Not on file   Alcohol Use: No   Drug Use: No   Current Outpatient Medications on File Prior to Visit  Medication Sig Dispense Refill   aspirin  EC 81 MG tablet Take 81 mg by mouth daily. Swallow whole.     B Complex-C (SUPER B COMPLEX PO) Take 2 capsules by mouth daily.     Blood Glucose Monitoring Suppl (ONE TOUCH ULTRA 2) w/Device KIT Use to check blood sugar 3 times daily 1 kit 0   Cholecalciferol (VITAMIN D3) 50 MCG (2000 UT) CAPS Take 15,000 Units by mouth daily. otc     dapagliflozin  propanediol (FARXIGA ) 10 MG TABS tablet Take 1 tablet (10 mg total) by mouth daily before breakfast. (Patient not taking: Reported on 03/24/2024) 90 tablet 3   glucose blood (ONETOUCH ULTRA) test strip USE AS INSTRUCTED TO CHECK  SUGAR 3 TIMES DAILY 300 strip 4   insulin  glargine (LANTUS  SOLOSTAR) 100 UNIT/ML Solostar Pen INJECT 25 UNITS INTO THE SKIN DAILY. (Patient taking differently: Inject 25 Units into the skin. Every other day) 22.5 mL 2   Insulin  Pen Needle 32G X 4 MM MISC Use 4x a day 400 each  3   KRILL OIL PO Take by mouth daily.     Lancets (ONETOUCH DELICA PLUS LANCET33G) MISC TEST 3 TIMES DAILY 300 each 1   losartan  (COZAAR ) 50 MG tablet Take 1 tablet (50 mg total) by mouth daily. 90 tablet 1  magnesium  oxide (MAG-OX) 400 (240 Mg) MG tablet Take 1 tablet by mouth at bedtime.     Multiple Vitamin (MULTIVITAMIN) tablet Take 1 tablet by mouth daily.     OIL OF OREGANO PO Take 2 capsules by mouth daily in the afternoon.     tirzepatide  (MOUNJARO ) 7.5 MG/0.5ML Pen Inject 7.5 mg into the skin once a week.     No current facility-administered medications on file prior to visit.  LMP  (LMP Unknown)  Allergies  Allergen Reactions   Humalog  [Insulin  Lispro] Other (See Comments)    Paresthesia - hands   Latex Hives    itching   Family History  Problem Relation Age of Onset   Hypertension Mother    Diabetes Mother    Thyroid  disease Mother    Diabetes Father    Cancer Father        Colon cancer   Hypertension Father    Diabetes Sister    Diabetes Sister    Diabetes Sister    Diabetes Sister    PE:  BP 120/70   Pulse 67   Ht 5' 0.5 (1.537 m)   Wt 153 lb 12.8 oz (69.8 kg)   LMP  (LMP Unknown)   SpO2 99%   BMI 29.54 kg/m   Wt Readings from Last 3 Encounters:  04/01/24 153 lb 12.8 oz (69.8 kg)  02/19/24 151 lb 6.1 oz (68.7 kg)  12/23/23 156 lb 12.8 oz (71.1 kg)   Constitutional: overweight, in NAD Eyes: EOMI, no exophthalmos ENT: no thyromegaly, no cervical lymphadenopathy Cardiovascular: RRR, No MRG Respiratory: CTA B Musculoskeletal: no deformities Skin: + Macular darkened discoloration on bilateral legs Neurological: no tremor with outstretched hands Diabetic Foot Exam - Simple   Simple Foot Form Diabetic Foot exam was performed with the following findings: Yes 04/01/2024  9:12 AM  Visual Inspection No deformities, no ulcerations, no other skin breakdown bilaterally: Yes Sensation Testing Intact to touch and monofilament testing bilaterally: Yes Pulse  Check Posterior Tibialis and Dorsalis pulse intact bilaterally: Yes Comments    ASSESSMENT: 1. DM2, insulin -dependent, uncontrolled, with complications - DR  No family history of medullary thyroid  cancer or personal history of pancreatitis.  2. Overweight  PLAN:  1. Patient longstanding, uncontrolled, type 2 diabetes, previously on SGLT2 inhibitor, weekly GLP-1/GIP receptor agonist and long-acting insulin , with good control.  However, afterwards, she was not able to afford refilling her medications and came off.  HbA1c increased from 6.0% to 8.0% at last visit.  She just received Lantus  from patient assistance before our last visit and I advised her to start it right away.  We also gave her Jardiance  sample and sent a prescription for generic Farxiga  to her pharmacy.  We discussed about starting back on the GLP-1 or GLP-1/GIP receptor agonist afterwards. CGM interpretation: -At today's visit, we reviewed her CGM downloads: It appears that 93% of values are in target range (goal >70%), while 7% are higher than 180 (goal <25%), and 0% are lower than 70 (goal <4%).  The calculated average blood sugar is 125.  The projected HbA1c for the next 3 months (GMI) is 6.3%. -Reviewing the CGM trends, sugars appear to be fluctuating at the lower limit of the target range overnight and then increasing after meals.  She does have hyperglycemic spikes particularly after lunch and occasionally after dinner.  At today's visit, we will reduce the dose of her Lantus , continue Mounjaro  for now and try to add back either Farxiga  or Jardiance .  Patient  was given patient assistance program forms.  I sent a prescription for Mounjaro  to her pharmacy and I am really hoping that this is approved for her.  For now, she has a 1.44-month supply of 7.5 mg weekly dose after her sister stopped using it due to side effects. - I suggested to:  Patient Instructions  Please decrease: - Lantus  20 units daily in am  Continue:  -  Mounjaro  7.5 mg weekly  Try to start: - Farxiga  10 mg before b'fast   Please return in 3-4 months  - we checked her HbA1c: 6.9% (lower) - advised to check sugars at different times of the day - 4x a day, rotating check times - advised for yearly eye exams >> she is UTD - return to clinic in 3-4 months   2. Overweight - She tried intermittent fasting and also plant-based diet in the past - Started to go to the gym after retirement - Unfortunately, she came off Mounjaro  and Jardiance  which could both have helped with weight loss.  At last visit, I recommended to restart Jardiance .  She was not able to get this but she is now back on Mounjaro . - Weight was approximately stable at last visit and she lost 3 pounds since then  Lela Fendt, MD PhD [facility] Endocrinology

## 2024-04-08 ENCOUNTER — Telehealth: Payer: Self-pay | Admitting: Pharmacist

## 2024-04-08 ENCOUNTER — Other Ambulatory Visit: Payer: Self-pay | Admitting: Pharmacist

## 2024-04-08 NOTE — Progress Notes (Signed)
 Attempt was made to contact patient by phone today for follow up by Clinical Pharmacist regarding diabetes.  Unable to reach patient. LM on VM with my contact number [phone] or [place] office at [phone] (today only)

## 2024-05-11 ENCOUNTER — Other Ambulatory Visit: Payer: Self-pay | Admitting: Physician Assistant

## 2024-05-11 DIAGNOSIS — Z1231 Encounter for screening mammogram for malignant neoplasm of breast: Secondary | ICD-10-CM

## 2024-05-19 ENCOUNTER — Telehealth: Payer: Self-pay | Admitting: Physician Assistant

## 2024-05-19 NOTE — Telephone Encounter (Signed)
 [place] [employer] [place] faxed  provider form, to be filled out by provider. Patient requested to send it back via Fax within ASAP. Document is located in providers tray at front office.Please advise at [phone].

## 2024-05-20 NOTE — Telephone Encounter (Signed)
 Form in provider box for review, however Farxiga  10 mg tablet managed by Lela Fendt, MD not PCP

## 2024-05-23 NOTE — Telephone Encounter (Signed)
 Per previous msg, forms have been completed.

## 2024-05-24 NOTE — Telephone Encounter (Signed)
 Received Provider Portion PAP application ([employer]&ME) Farxiga .

## 2024-05-27 ENCOUNTER — Telehealth: Payer: Self-pay | Admitting: Dietician

## 2024-05-27 NOTE — Telephone Encounter (Signed)
 Patient left a voice mail asking if our office has patient assistance insulin  for her.  She would like to pick this up today.  Will forward message to MA.  Kristen [name], RD, LDN, CDCES, DipACLM

## 2024-06-07 ENCOUNTER — Ambulatory Visit
Admission: RE | Admit: 2024-06-07 | Discharge: 2024-06-07 | Disposition: A | Discharge status: Home / Self Care | Source: Ambulatory Visit | Attending: Physician Assistant | Admitting: Physician Assistant

## 2024-06-07 DIAGNOSIS — Z1231 Encounter for screening mammogram for malignant neoplasm of breast: Secondary | ICD-10-CM

## 2024-06-13 ENCOUNTER — Telehealth: Payer: Self-pay | Admitting: Radiology

## 2024-06-13 NOTE — Telephone Encounter (Signed)
 MG MM DIGITAL SCREENING BILAT marked to review ASAP.

## 2024-06-20 ENCOUNTER — Telehealth: Payer: Self-pay

## 2024-06-20 ENCOUNTER — Other Ambulatory Visit: Payer: Self-pay

## 2024-06-20 MED ORDER — DEXCOM G7 SENSOR MISC: 3 refills | Status: AC

## 2024-06-20 MED ORDER — DEXCOM G7 RECEIVER DEVI: 0 refills | Status: AC

## 2024-06-20 NOTE — Telephone Encounter (Signed)
 Patient Assistance  Medication:Lantus  Dosage:U100 Quantity:2

## 2024-06-22 ENCOUNTER — Telehealth: Payer: Self-pay | Admitting: Internal Medicine

## 2024-06-22 NOTE — Telephone Encounter (Signed)
 [employer]&Me forms have been placed on provider's desk to be signed and faxed back to [phone]

## 2024-06-22 NOTE — Telephone Encounter (Signed)
 Reached out to Patient regarding PAP application for Farxiga  (AZ&ME). Left HIPAA compliant V/M if assistance is needed to return call.

## 2024-06-22 NOTE — Telephone Encounter (Signed)
Patient came in to office today and picked up 2 boxes of patient assistance Lantus.

## 2024-06-22 NOTE — Telephone Encounter (Signed)
 Patient came in to office today and brought a AZ&ME Application for Free [employer] Medicines to be completed.  The application is in Dr. Ara folder in the front office.

## 2024-06-23 NOTE — Telephone Encounter (Signed)
 Form has been faxed.

## 2024-06-29 NOTE — Telephone Encounter (Signed)
 PAP: Patient assistance application for Farxiga  has been approved by PAP Companies: AZ&ME from 06/16/2024 to 06/15/2025. Medication should be delivered to PAP Delivery: Home. For further shipping updates, please contact [employer] (AZ&Me) at [phone]. Patient ID is: not provided.

## 2024-07-08 ENCOUNTER — Other Ambulatory Visit: Payer: Self-pay

## 2024-07-08 MED ORDER — LANTUS SOLOSTAR 100 UNIT/ML ~~LOC~~ SOPN: 25.0000 [IU] | PEN_INJECTOR | Freq: Every day | SUBCUTANEOUS | 3 refills | Status: AC

## 2024-07-08 NOTE — Progress Notes (Signed)
 Kristen [name]                                          MRN: [id number]   07/08/2024   The VBCI Quality Team Specialist reviewed this patient medical record for the purposes of chart review for care gap closure. The following were reviewed: chart review for care gap closure-glycemic status assessment.    VBCI Quality Team

## 2024-07-12 ENCOUNTER — Other Ambulatory Visit: Payer: Self-pay

## 2024-07-12 MED ORDER — INSULIN PEN NEEDLE 32G X 4 MM MISC: 12 refills | Status: AC

## 2024-08-04 ENCOUNTER — Ambulatory Visit: Admitting: Internal Medicine

## 2024-12-15 ENCOUNTER — Ambulatory Visit
